# Patient Record
Sex: Female | Born: 1985
Health system: Southern US, Community
[De-identification: ages and names within clinical notes are randomized; demographics above are authoritative.]

## PROBLEM LIST (undated history)

## (undated) DIAGNOSIS — J45909 Unspecified asthma, uncomplicated: Secondary | ICD-10-CM

---

## 2016-08-01 ENCOUNTER — Emergency Department (HOSPITAL_COMMUNITY)
Admission: EM | Admit: 2016-08-01 | Discharge: 2016-08-01 | Disposition: A | Discharge status: Home / Self Care | Payer: Medicare Other | Attending: Emergency Medicine | Admitting: Emergency Medicine

## 2016-08-01 ENCOUNTER — Emergency Department (HOSPITAL_COMMUNITY): Payer: Medicare Other

## 2016-08-01 ENCOUNTER — Encounter (HOSPITAL_COMMUNITY): Payer: Self-pay | Admitting: Emergency Medicine

## 2016-08-01 DIAGNOSIS — R0602 Shortness of breath: Secondary | ICD-10-CM | POA: Diagnosis present

## 2016-08-01 DIAGNOSIS — J9801 Acute bronchospasm: Secondary | ICD-10-CM

## 2016-08-01 DIAGNOSIS — D649 Anemia, unspecified: Secondary | ICD-10-CM | POA: Insufficient documentation

## 2016-08-01 HISTORY — DX: Unspecified asthma, uncomplicated: J45.909

## 2016-08-01 LAB — CBC
HEMATOCRIT: 30.5 % — AB (ref 36.0–46.0)
Hemoglobin: 9.3 g/dL — ABNORMAL LOW (ref 12.0–15.0)
MCH: 21.7 pg — ABNORMAL LOW (ref 26.0–34.0)
MCHC: 30.5 g/dL (ref 30.0–36.0)
MCV: 71.1 fL — AB (ref 78.0–100.0)
Platelets: 215 10*3/uL (ref 150–400)
RBC: 4.29 MIL/uL (ref 3.87–5.11)
RDW: 15.3 % (ref 11.5–15.5)
WBC: 7.1 10*3/uL (ref 4.0–10.5)

## 2016-08-01 LAB — BASIC METABOLIC PANEL
Anion gap: 9 (ref 5–15)
BUN: 8 mg/dL (ref 6–20)
CHLORIDE: 105 mmol/L (ref 101–111)
CO2: 25 mmol/L (ref 22–32)
Calcium: 8.6 mg/dL — ABNORMAL LOW (ref 8.9–10.3)
Creatinine, Ser: 0.78 mg/dL (ref 0.44–1.00)
GFR calc Af Amer: >60 mL/min (ref >60)
GLUCOSE: 135 mg/dL — AB (ref 65–99)
POTASSIUM: 3.1 mmol/L — AB (ref 3.5–5.1)
Sodium: 139 mmol/L (ref 135–145)

## 2016-08-01 LAB — D-DIMER, QUANTITATIVE: D-Dimer, Quant: <0.27 ug/mL-FEU (ref 0.00–0.50)

## 2016-08-01 LAB — I-STAT TROPONIN, ED: Troponin i, poc: 0 ng/mL (ref 0.00–0.08)

## 2016-08-01 MED ORDER — IPRATROPIUM BROMIDE 0.02 % IN SOLN
0.5000 mg | Freq: Once | RESPIRATORY_TRACT | Status: AC
  Administered 2016-08-01: 0.5 mg via RESPIRATORY_TRACT
  Filled 2016-08-01: qty 2.5

## 2016-08-01 MED ORDER — PREDNISONE 20 MG PO TABS: 40.0000 mg | ORAL_TABLET | Freq: Every day | ORAL | 0 refills | Status: DC

## 2016-08-01 MED ORDER — SODIUM CHLORIDE 0.9 % IV BOLUS (SEPSIS)
1000.0000 mL | Freq: Once | INTRAVENOUS | Status: AC
  Administered 2016-08-01: 1000 mL via INTRAVENOUS

## 2016-08-01 MED ORDER — ALBUTEROL SULFATE HFA 108 (90 BASE) MCG/ACT IN AERS
2.0000 | INHALATION_SPRAY | Freq: Once | RESPIRATORY_TRACT | Status: AC
  Administered 2016-08-01: 2 via RESPIRATORY_TRACT
  Filled 2016-08-01: qty 6.7

## 2016-08-01 MED ORDER — MAGNESIUM SULFATE 2 GM/50ML IV SOLN
2.0000 g | Freq: Once | INTRAVENOUS | Status: AC
  Administered 2016-08-01: 2 g via INTRAVENOUS
  Filled 2016-08-01: qty 50

## 2016-08-01 MED ORDER — ALBUTEROL (5 MG/ML) CONTINUOUS INHALATION SOLN
10.0000 mg/h | INHALATION_SOLUTION | Freq: Once | RESPIRATORY_TRACT | Status: AC
  Administered 2016-08-01: 10 mg/h via RESPIRATORY_TRACT
  Filled 2016-08-01: qty 20

## 2016-08-01 MED ORDER — METHYLPREDNISOLONE SODIUM SUCC 125 MG IJ SOLR
125.0000 mg | Freq: Once | INTRAMUSCULAR | Status: AC
  Administered 2016-08-01: 125 mg via INTRAVENOUS
  Filled 2016-08-01: qty 2

## 2016-08-01 NOTE — ED Provider Notes (Signed)
[facility]-EMERGENCY DEPT Provider Note   CSN: [id number] Arrival date & time: 08/01/16  1032     History   Chief Complaint Chief Complaint  Patient presents with  . Chest Pain  . Shortness of Breath    HPI Lori Black is a 30 y.o. female.  HPI Lori Black is a 30 y.o. female with history of asthma, presents to emergency department complaining of shortness of breath. Patient states that she had a viral upper respiratory tract infection 2 weeks ago, states symptoms have been improving up until recently when her cough has gotten worse and now she is wheezing and having shortness of breath. She states she is unable to sleep at night because of shortness of breath. She has been doing at home nebulized treatments which she states has not helped. Reports multiple treatments every day. He does not have a primary care doctor at this time. She has not seen anybody for this. She denies any chest pain. She denies any fever or chills. She denies pregnancy. She states her cough is dry, nonproductive. She denies any pain or swelling in extremities. She denies any recent travel. She did move from [place] a little over a month ago. She reports no other associated symptoms.  Past Medical History:  Diagnosis Date  . Asthma     There are no active problems to display for this patient.   Past Surgical History:  Procedure Laterality Date  . CESAREAN SECTION      OB History    No data available       Home Medications    Prior to Admission medications   Not on File    Family History History reviewed. No pertinent family history.  Social History Social History  Substance Use Topics  . Smoking status: Never Smoker  . Smokeless tobacco: Never Used  . Alcohol use Yes     Comment: occassionally     Allergies   Review of patient's allergies indicates no known allergies.   Review of Systems Review of Systems  Constitutional: Negative for chills and fever.  HENT: Positive for  congestion.   Respiratory: Positive for cough, chest tightness, shortness of breath and wheezing.   Cardiovascular: Negative for chest pain, palpitations and leg swelling.  Gastrointestinal: Negative for abdominal pain, diarrhea, nausea and vomiting.  Genitourinary: Negative for dysuria, flank pain and pelvic pain.  Musculoskeletal: Negative for arthralgias, myalgias, neck pain and neck stiffness.  Skin: Negative for rash.  Neurological: Negative for dizziness, weakness and headaches.  All other systems reviewed and are negative.    Physical Exam Updated Vital Signs BP 152/80   Pulse 113   Temp 97.9 F (36.6 C) (Oral)   Resp 14   LMP 07/16/2016   SpO2 92%   Physical Exam  Constitutional: She appears well-developed and well-nourished. No distress.  HENT:  Head: Normocephalic.  Eyes: Conjunctivae are normal.  Neck: Neck supple.  Cardiovascular: Normal rate, regular rhythm and normal heart sounds.   Pulmonary/Chest: Effort normal. No respiratory distress. She has wheezes. She has no rales.  Abdominal: Soft. Bowel sounds are normal. She exhibits no distension. There is no tenderness. There is no rebound.  Musculoskeletal: She exhibits no edema.  Neurological: She is alert.  Skin: Skin is warm and dry.  Psychiatric: She has a normal mood and affect. Her behavior is normal.  Nursing note and vitals reviewed.    ED Treatments / Results  Labs (all labs ordered are listed, but only abnormal results are displayed)  Labs Reviewed  BASIC METABOLIC PANEL - Abnormal; Notable for the following:       Result Value   Potassium 3.1 (*)    Glucose, Bld 135 (*)    Calcium 8.6 (*)    All other components within normal limits  CBC - Abnormal; Notable for the following:    Hemoglobin 9.3 (*)    HCT 30.5 (*)    MCV 71.1 (*)    MCH 21.7 (*)    All other components within normal limits  D-DIMER, QUANTITATIVE (NOT AT [facility])  Rosezena Sensor, ED    EKG ED ECG REPORT   Date:  08/01/2016  Rate: 102  Rhythm: normal sinus rhythm  QRS Axis: normal  Intervals: normal  ST/T Wave abnormalities: normal  Conduction Disutrbances:none  Narrative Interpretation:   Old EKG Reviewed: none available  I have personally reviewed the EKG tracing and agree with the computerized printout as noted.  Radiology Dg Chest 2 View  Result Date: 08/01/2016 CLINICAL DATA:  30 year old female with history of asthma. Cold for the past 2 weeks with shortness breath today. Initial encounter. EXAM: CHEST  2 VIEW COMPARISON:  None. FINDINGS: No infiltrate, congestive heart failure or pneumothorax. Azygos lobe configuration incidentally noted. Heart size within normal limits. No plain film evidence pulmonary malignancy. Minimal curvature thoracic spine. IMPRESSION: No active cardiopulmonary disease. Electronically Signed   By: Lacy Duverney M.D.   On: 08/01/2016 11:08    Procedures Procedures (including critical care time)  Medications Ordered in ED Medications  albuterol (PROVENTIL,VENTOLIN) solution continuous neb (not administered)  ipratropium (ATROVENT) nebulizer solution 0.5 mg (not administered)  methylPREDNISolone sodium succinate (SOLU-MEDROL) 125 mg/2 mL injection 125 mg (not administered)  magnesium sulfate IVPB 2 g 50 mL (not administered)  sodium chloride 0.9 % bolus 1,000 mL (not administered)     Initial Impression / Assessment and Plan / ED Course  I have reviewed the triage vital signs and the nursing notes.  Pertinent labs & imaging results that were available during my care of the patient were reviewed by me and considered in my medical decision making (see chart for details).  Clinical Course   Patient seen and examined, patient with wheezing, shortness of breath, cough for the last several days. A history of asthma. I was asked by nurse to come and see the patient, patient is hypoxic, 88% on room air, wheezing in all lung fields. She is tachycardic with heart rate  of 120s. Given vital signs findings, will get a d-dimer. Patient is low risk for a PE, with a history of the same. Will start on an hour-long treatment, Solu-Medrol and magnesium ordered. Will monitor  3:22 PM D-dimer negative. Patient received 1 hour-long neb. Oxygen saturation 100% room air right now. Will monitor. Will ambulate on pulse ox  4:34 PM Patient ambulated with no shortness of breath. Pulse ox 100% on room air. Patient states she feels much better. Lungs are clear. Patient is tachycardic here, heart rate and 120s and 130s. Most likely from albuterol. She came in with heart rate of 100. Her d-dimer is negative. She is afebrile. Normal blood pressure. Doubt sepsis. She was to be discharged home. We'll discharge home with an inhaler and prednisone. She is anemic, discussed this with her. She'll need to follow-up closely with her family doctor. Patient voiced understanding.    Final Clinical Impressions(s) / ED Diagnoses   Final diagnoses:  Bronchospasm  Anemia, unspecified type    New Prescriptions New Prescriptions   PREDNISONE (  DELTASONE) 20 MG TABLET    Take 2 tablets (40 mg total) by mouth daily.     Jaynie Crumbleatyana Makylee Sanborn, PA-C 08/01/16 1642    Heide Scaleshristopher J Tegeler, MD 08/01/16 [phone]

## 2016-08-01 NOTE — ED Notes (Signed)
Respiratory at bedside.

## 2016-08-01 NOTE — ED Notes (Signed)
Patients Lori Black

## 2016-08-01 NOTE — ED Triage Notes (Signed)
Pt states she has asthma and has been increasingly SOB. Pt states she cannot sleep. Pt has cough and nasal congestion. Pt also states her throat is sore. Pt states she has had CP with the SOB. Pt able to talk in complete sentences

## 2016-08-01 NOTE — Discharge Instructions (Signed)
Inhaler or nebulized treatment every 4 hours. Take prednisone as prescribed until all gone, next dose tomorrow. Please follow up with her family doctor for recheck and for further workup of your anemia. Return if worsening symptoms.

## 2016-08-13 ENCOUNTER — Emergency Department (HOSPITAL_COMMUNITY)
Admission: EM | Admit: 2016-08-13 | Discharge: 2016-08-13 | Disposition: A | Discharge status: Home / Self Care | Payer: Medicare Other | Attending: Emergency Medicine | Admitting: Emergency Medicine

## 2016-08-13 ENCOUNTER — Emergency Department (HOSPITAL_COMMUNITY): Payer: Medicare Other

## 2016-08-13 ENCOUNTER — Encounter (HOSPITAL_COMMUNITY): Payer: Self-pay | Admitting: *Deleted

## 2016-08-13 DIAGNOSIS — Z7951 Long term (current) use of inhaled steroids: Secondary | ICD-10-CM | POA: Insufficient documentation

## 2016-08-13 DIAGNOSIS — J45901 Unspecified asthma with (acute) exacerbation: Secondary | ICD-10-CM | POA: Diagnosis not present

## 2016-08-13 DIAGNOSIS — R0602 Shortness of breath: Secondary | ICD-10-CM | POA: Diagnosis present

## 2016-08-13 MED ORDER — PREDNISONE 50 MG PO TABS: ORAL_TABLET | ORAL | 0 refills | Status: DC

## 2016-08-13 MED ORDER — METHYLPREDNISOLONE SODIUM SUCC 125 MG IJ SOLR
125.0000 mg | Freq: Once | INTRAMUSCULAR | Status: AC
  Administered 2016-08-13: 125 mg via INTRAVENOUS
  Filled 2016-08-13: qty 2

## 2016-08-13 MED ORDER — BUDESONIDE-FORMOTEROL FUMARATE 80-4.5 MCG/ACT IN AERO: 2.0000 | INHALATION_SPRAY | Freq: Two times a day (BID) | RESPIRATORY_TRACT | 12 refills | Status: DC

## 2016-08-13 MED ORDER — SODIUM CHLORIDE 0.9 % IV BOLUS (SEPSIS)
500.0000 mL | Freq: Once | INTRAVENOUS | Status: AC
  Administered 2016-08-13: 500 mL via INTRAVENOUS

## 2016-08-13 NOTE — Discharge Instructions (Signed)
Chest x-ray showed no pneumonia. Prescription for Symbicort and prednisone. Continue to use your nebulizer treatments at home.

## 2016-08-13 NOTE — ED Provider Notes (Signed)
[facility]-EMERGENCY DEPT Provider Note   CSN: [id number] Arrival date & time: 08/13/16  1029     History   Chief Complaint Chief Complaint  Patient presents with  . Asthma  . Shortness of Breath    HPI Lori Black is a 30 y.o. female.  Level V caveat for urgent need for intervention. Patient has known severe asthma. She takes albuterol at home. She has been unable to refill her Symbicort. Wheezing and coughing started in the middle the night. She has taken several breathing treatments with minimal relief. EMS was called. She has been on steroids in the past.      Past Medical History:  Diagnosis Date  . Asthma     There are no active problems to display for this patient.   Past Surgical History:  Procedure Laterality Date  . CESAREAN SECTION      OB History    No data available       Home Medications    Prior to Admission medications   Medication Sig Start Date End Date Taking? Authorizing Provider  albuterol (PROVENTIL HFA;VENTOLIN HFA) 108 (90 Base) MCG/ACT inhaler Inhale 2 puffs into the lungs every 4 (four) hours as needed for wheezing or shortness of breath.   Yes Historical Provider, MD  albuterol (PROVENTIL) (2.5 MG/3ML) 0.083% nebulizer solution Take 2.5 mg by nebulization every 6 (six) hours as needed for wheezing or shortness of breath.   Yes Historical Provider, MD  budesonide-formoterol (SYMBICORT) 80-4.5 MCG/ACT inhaler Inhale 2 puffs into the lungs 2 (two) times daily. 08/13/16   Donnetta HutchingBrian Kierston Plasencia, MD  predniSONE (DELTASONE) 50 MG tablet 1 tablet daily for 4 days, one half tab daily for 4 days 08/13/16   Donnetta HutchingBrian Khush Pasion, MD    Family History No family history on file.  Social History Social History  Substance Use Topics  . Smoking status: Never Smoker  . Smokeless tobacco: Never Used  . Alcohol use Yes     Comment: occassionally     Allergies   Review of patient's allergies indicates no known allergies.   Review of Systems Review of Systems    Reason unable to perform ROS: urgent need for intervention.     Physical Exam Updated Vital Signs BP 117/73   Pulse 119   Temp 99.1 F (37.3 C) (Oral)   Resp 22   LMP 07/30/2016   SpO2 93%   Physical Exam  Constitutional: She is oriented to person, place, and time. She appears well-developed and well-nourished.  HENT:  Head: Normocephalic and atraumatic.  Eyes: Conjunctivae are normal.  Neck: Neck supple.  Cardiovascular: Normal rate and regular rhythm.   Pulmonary/Chest:  Slight tachypnea; bilateral exp wheezing  Abdominal: Soft. Bowel sounds are normal.  Musculoskeletal: Normal range of motion.  Neurological: She is alert and oriented to person, place, and time.  Skin: Skin is warm and dry.  Psychiatric: She has a normal mood and affect. Her behavior is normal.  Nursing note and vitals reviewed.    ED Treatments / Results  Labs (all labs ordered are listed, but only abnormal results are displayed) Labs Reviewed - No data to display  EKG  EKG Interpretation None       Radiology Dg Chest 2 View  Result Date: 08/13/2016 CLINICAL DATA:  Shortness of breath. Dry cough. Upper chest pain with coughing. EXAM: CHEST  2 VIEW COMPARISON:  08/01/2016. FINDINGS: Normal sized heart. Clear lungs with normal vascularity. Minimal thoracic spine degenerative changes. IMPRESSION: No acute abnormality. Electronically Signed  By: Beckie SaltsSteven  Reid M.D.   On: 08/13/2016 11:39    Procedures Procedures (including critical care time)  Medications Ordered in ED Medications  sodium chloride 0.9 % bolus 500 mL (0 mLs Intravenous Stopped 08/13/16 1307)  methylPREDNISolone sodium succinate (SOLU-MEDROL) 125 mg/2 mL injection 125 mg (125 mg Intravenous Given 08/13/16 1151)     Initial Impression / Assessment and Plan / ED Course  I have reviewed the triage vital signs and the nursing notes.  Pertinent labs & imaging results that were available during my care of the patient were  reviewed by me and considered in my medical decision making (see chart for details).  Clinical Course    Patient feels better after nebulizer treatment and IV steroids. Chest x-ray shows no pneumonia.  Chest x-ray negative. Discharge medication Symbicort and prednisone.  Referral to pulmonologist.  Final Clinical Impressions(s) / ED Diagnoses   Final diagnoses:  Moderate asthma with exacerbation, unspecified whether persistent    New Prescriptions Discharge Medication List as of 08/13/2016  1:45 PM    START taking these medications   Details  budesonide-formoterol (SYMBICORT) 80-4.5 MCG/ACT inhaler Inhale 2 puffs into the lungs 2 (two) times daily., Starting Sat 08/13/2016, Print         Donnetta HutchingBrian Sarabelle Genson, MD 08/13/16 1421

## 2016-08-13 NOTE — ED Notes (Signed)
Family at bedside. 

## 2016-08-13 NOTE — ED Triage Notes (Signed)
Pt BIB EMS, SOB since 0300 has taken a total of 4 or more Albuterol treatments per nebulizer. Upon EMS arrival she was at 87% RA wheezing throughout. ST 12 lead EKG in route. She has not taken Symbicort and prednisone in 1 month which is apart of her daily medication regimen

## 2016-10-09 ENCOUNTER — Emergency Department (HOSPITAL_COMMUNITY): Payer: Medicare Other

## 2016-10-09 ENCOUNTER — Encounter (HOSPITAL_COMMUNITY): Payer: Self-pay | Admitting: Emergency Medicine

## 2016-10-09 ENCOUNTER — Inpatient Hospital Stay (HOSPITAL_COMMUNITY)
Admission: EM | Admit: 2016-10-09 | Discharge: 2016-10-11 | DRG: 202 | Disposition: A | Discharge status: Home / Self Care | Payer: Medicare Other | Attending: Internal Medicine | Admitting: Internal Medicine

## 2016-10-09 DIAGNOSIS — J45901 Unspecified asthma with (acute) exacerbation: Secondary | ICD-10-CM | POA: Diagnosis not present

## 2016-10-09 DIAGNOSIS — J4541 Moderate persistent asthma with (acute) exacerbation: Secondary | ICD-10-CM

## 2016-10-09 DIAGNOSIS — J09X2 Influenza due to identified novel influenza A virus with other respiratory manifestations: Secondary | ICD-10-CM | POA: Diagnosis present

## 2016-10-09 DIAGNOSIS — R0602 Shortness of breath: Secondary | ICD-10-CM | POA: Diagnosis not present

## 2016-10-09 DIAGNOSIS — A419 Sepsis, unspecified organism: Secondary | ICD-10-CM | POA: Diagnosis not present

## 2016-10-09 DIAGNOSIS — J9600 Acute respiratory failure, unspecified whether with hypoxia or hypercapnia: Secondary | ICD-10-CM | POA: Diagnosis not present

## 2016-10-09 DIAGNOSIS — D509 Iron deficiency anemia, unspecified: Secondary | ICD-10-CM

## 2016-10-09 DIAGNOSIS — J4521 Mild intermittent asthma with (acute) exacerbation: Secondary | ICD-10-CM

## 2016-10-09 DIAGNOSIS — N390 Urinary tract infection, site not specified: Secondary | ICD-10-CM

## 2016-10-09 DIAGNOSIS — R000 Tachycardia, unspecified: Secondary | ICD-10-CM | POA: Diagnosis present

## 2016-10-09 DIAGNOSIS — T380X5A Adverse effect of glucocorticoids and synthetic analogues, initial encounter: Secondary | ICD-10-CM | POA: Diagnosis present

## 2016-10-09 DIAGNOSIS — Z79899 Other long term (current) drug therapy: Secondary | ICD-10-CM

## 2016-10-09 DIAGNOSIS — B349 Viral infection, unspecified: Secondary | ICD-10-CM

## 2016-10-09 DIAGNOSIS — Z7951 Long term (current) use of inhaled steroids: Secondary | ICD-10-CM

## 2016-10-09 DIAGNOSIS — N3090 Cystitis, unspecified without hematuria: Secondary | ICD-10-CM | POA: Diagnosis present

## 2016-10-09 DIAGNOSIS — J9601 Acute respiratory failure with hypoxia: Secondary | ICD-10-CM | POA: Diagnosis present

## 2016-10-09 DIAGNOSIS — J101 Influenza due to other identified influenza virus with other respiratory manifestations: Secondary | ICD-10-CM

## 2016-10-09 LAB — RESPIRATORY PANEL BY PCR
Adenovirus: NOT DETECTED
BORDETELLA PERTUSSIS-RVPCR: NOT DETECTED
CORONAVIRUS 229E-RVPPCR: NOT DETECTED
Chlamydophila pneumoniae: NOT DETECTED
Coronavirus HKU1: NOT DETECTED
Coronavirus NL63: NOT DETECTED
Coronavirus OC43: NOT DETECTED
INFLUENZA B-RVPPCR: NOT DETECTED
Influenza A H3: DETECTED — AB
METAPNEUMOVIRUS-RVPPCR: NOT DETECTED
Mycoplasma pneumoniae: NOT DETECTED
PARAINFLUENZA VIRUS 2-RVPPCR: NOT DETECTED
Parainfluenza Virus 1: NOT DETECTED
Parainfluenza Virus 3: NOT DETECTED
Parainfluenza Virus 4: NOT DETECTED
RESPIRATORY SYNCYTIAL VIRUS-RVPPCR: NOT DETECTED
Rhinovirus / Enterovirus: NOT DETECTED

## 2016-10-09 LAB — CBC WITH DIFFERENTIAL/PLATELET
BASOS PCT: 0 %
Basophils Absolute: 0 10*3/uL (ref 0.0–0.1)
EOS ABS: 0 10*3/uL (ref 0.0–0.7)
EOS PCT: 0 %
HCT: 30.7 % — ABNORMAL LOW (ref 36.0–46.0)
Hemoglobin: 9.7 g/dL — ABNORMAL LOW (ref 12.0–15.0)
LYMPHS ABS: 0.7 10*3/uL (ref 0.7–4.0)
Lymphocytes Relative: 8 %
MCH: 21.7 pg — AB (ref 26.0–34.0)
MCHC: 31.6 g/dL (ref 30.0–36.0)
MCV: 68.7 fL — ABNORMAL LOW (ref 78.0–100.0)
MONOS PCT: 6 %
Monocytes Absolute: 0.6 10*3/uL (ref 0.1–1.0)
NEUTROS PCT: 85 %
Neutro Abs: 7.5 10*3/uL (ref 1.7–7.7)
PLATELETS: 162 10*3/uL (ref 150–400)
RBC: 4.47 MIL/uL (ref 3.87–5.11)
RDW: 15.4 % (ref 11.5–15.5)
WBC: 8.8 10*3/uL (ref 4.0–10.5)

## 2016-10-09 LAB — COMPREHENSIVE METABOLIC PANEL
ALK PHOS: 47 U/L (ref 38–126)
ALT: 20 U/L (ref 14–54)
AST: 22 U/L (ref 15–41)
Albumin: 4 g/dL (ref 3.5–5.0)
Anion gap: 12 (ref 5–15)
BUN: 13 mg/dL (ref 6–20)
CALCIUM: 8.3 mg/dL — AB (ref 8.9–10.3)
CO2: 23 mmol/L (ref 22–32)
CREATININE: 0.87 mg/dL (ref 0.44–1.00)
Chloride: 100 mmol/L — ABNORMAL LOW (ref 101–111)
Glucose, Bld: 153 mg/dL — ABNORMAL HIGH (ref 65–99)
Potassium: 3.2 mmol/L — ABNORMAL LOW (ref 3.5–5.1)
Sodium: 135 mmol/L (ref 135–145)
Total Bilirubin: 1 mg/dL (ref 0.3–1.2)
Total Protein: 7 g/dL (ref 6.5–8.1)

## 2016-10-09 LAB — URINALYSIS, ROUTINE W REFLEX MICROSCOPIC
Bilirubin Urine: NEGATIVE
GLUCOSE, UA: NEGATIVE mg/dL
Ketones, ur: 5 mg/dL — AB
NITRITE: NEGATIVE
PH: 5 (ref 5.0–8.0)
Protein, ur: NEGATIVE mg/dL
RBC / HPF: NONE SEEN RBC/hpf (ref 0–5)
SPECIFIC GRAVITY, URINE: 1.015 (ref 1.005–1.030)

## 2016-10-09 LAB — I-STAT CG4 LACTIC ACID, ED
Lactic Acid, Venous: 2.39 mmol/L (ref 0.5–1.9)
Lactic Acid, Venous: 1.43 mmol/L (ref 0.5–1.9)

## 2016-10-09 MED ORDER — DEXTROSE 5 % IV SOLN
500.0000 mg | INTRAVENOUS | Status: DC
  Filled 2016-10-09: qty 500

## 2016-10-09 MED ORDER — CEFTRIAXONE SODIUM 1 G IJ SOLR
1.0000 g | INTRAMUSCULAR | Status: AC
  Administered 2016-10-09: 1 g via INTRAVENOUS
  Filled 2016-10-09: qty 10

## 2016-10-09 MED ORDER — DEXTROSE 5 % IV SOLN: 500.0000 mg | INTRAVENOUS | Status: DC

## 2016-10-09 MED ORDER — SODIUM CHLORIDE 0.9 % IV SOLN
INTRAVENOUS | Status: DC
  Administered 2016-10-09 – 2016-10-11 (×3): via INTRAVENOUS

## 2016-10-09 MED ORDER — ONDANSETRON HCL 4 MG PO TABS
4.0000 mg | ORAL_TABLET | Freq: Four times a day (QID) | ORAL | Status: DC | PRN
  Administered 2016-10-10 – 2016-10-11 (×2): 4 mg via ORAL
  Filled 2016-10-09 (×2): qty 1

## 2016-10-09 MED ORDER — DEXTROSE 5 % IV SOLN
500.0000 mg | Freq: Once | INTRAVENOUS | Status: AC
  Administered 2016-10-09: 500 mg via INTRAVENOUS
  Filled 2016-10-09: qty 500

## 2016-10-09 MED ORDER — SODIUM CHLORIDE 0.9 % IV SOLN
30.0000 meq | Freq: Once | INTRAVENOUS | Status: DC
  Administered 2016-10-09: 30 meq via INTRAVENOUS

## 2016-10-09 MED ORDER — OSELTAMIVIR PHOSPHATE 75 MG PO CAPS
75.0000 mg | ORAL_CAPSULE | Freq: Two times a day (BID) | ORAL | Status: DC
  Administered 2016-10-09 – 2016-10-11 (×4): 75 mg via ORAL
  Filled 2016-10-09 (×4): qty 1

## 2016-10-09 MED ORDER — ONDANSETRON HCL 4 MG/2ML IJ SOLN: 4.0000 mg | Freq: Four times a day (QID) | INTRAMUSCULAR | Status: DC | PRN

## 2016-10-09 MED ORDER — ACETAMINOPHEN 325 MG PO TABS: 650.0000 mg | ORAL_TABLET | Freq: Four times a day (QID) | ORAL | Status: DC | PRN

## 2016-10-09 MED ORDER — METHYLPREDNISOLONE SODIUM SUCC 125 MG IJ SOLR
125.0000 mg | Freq: Once | INTRAMUSCULAR | Status: AC
  Administered 2016-10-09: 125 mg via INTRAVENOUS
  Filled 2016-10-09: qty 2

## 2016-10-09 MED ORDER — SODIUM CHLORIDE 0.9 % IV SOLN
1000.0000 mL | INTRAVENOUS | Status: DC
  Administered 2016-10-09 (×2): 1000 mL via INTRAVENOUS

## 2016-10-09 MED ORDER — ACETAMINOPHEN 325 MG PO TABS
650.0000 mg | ORAL_TABLET | Freq: Once | ORAL | Status: AC
  Administered 2016-10-09: 650 mg via ORAL
  Filled 2016-10-09: qty 2

## 2016-10-09 MED ORDER — METHYLPREDNISOLONE SODIUM SUCC 125 MG IJ SOLR
125.0000 mg | Freq: Two times a day (BID) | INTRAMUSCULAR | Status: DC
  Administered 2016-10-10: 125 mg via INTRAVENOUS
  Filled 2016-10-09: qty 2

## 2016-10-09 MED ORDER — ACETAMINOPHEN 650 MG RE SUPP: 650.0000 mg | Freq: Four times a day (QID) | RECTAL | Status: DC | PRN

## 2016-10-09 MED ORDER — POTASSIUM CHLORIDE 10 MEQ/100ML IV SOLN
10.0000 meq | INTRAVENOUS | Status: AC
  Administered 2016-10-09 (×2): 10 meq via INTRAVENOUS
  Filled 2016-10-09 (×3): qty 100

## 2016-10-09 MED ORDER — ALBUTEROL (5 MG/ML) CONTINUOUS INHALATION SOLN
10.0000 mg/h | INHALATION_SOLUTION | Freq: Once | RESPIRATORY_TRACT | Status: AC
  Administered 2016-10-09: 10 mg/h via RESPIRATORY_TRACT
  Filled 2016-10-09: qty 20

## 2016-10-09 MED ORDER — IPRATROPIUM-ALBUTEROL 0.5-2.5 (3) MG/3ML IN SOLN: 3.0000 mL | RESPIRATORY_TRACT | Status: DC | PRN

## 2016-10-09 MED ORDER — PNEUMOCOCCAL VAC POLYVALENT 25 MCG/0.5ML IJ INJ
0.5000 mL | INJECTION | INTRAMUSCULAR | Status: DC
  Filled 2016-10-09: qty 0.5

## 2016-10-09 MED ORDER — LEVALBUTEROL HCL 0.63 MG/3ML IN NEBU
0.6300 mg | INHALATION_SOLUTION | RESPIRATORY_TRACT | Status: DC | PRN
  Administered 2016-10-09 – 2016-10-11 (×4): 0.63 mg via RESPIRATORY_TRACT
  Filled 2016-10-09 (×5): qty 3

## 2016-10-09 MED ORDER — LEVALBUTEROL HCL 0.63 MG/3ML IN NEBU: 0.6300 mg | INHALATION_SOLUTION | RESPIRATORY_TRACT | Status: DC

## 2016-10-09 MED ORDER — SODIUM CHLORIDE 0.9 % IV BOLUS (SEPSIS)
1000.0000 mL | Freq: Once | INTRAVENOUS | Status: AC
  Administered 2016-10-09: 1000 mL via INTRAVENOUS

## 2016-10-09 MED ORDER — DEXTROSE 5 % IV SOLN: 1.0000 g | INTRAVENOUS | Status: DC

## 2016-10-09 MED ORDER — DEXTROSE 5 % IV SOLN
1.0000 g | INTRAVENOUS | Status: DC
  Filled 2016-10-09: qty 10

## 2016-10-09 MED ORDER — HEPARIN SODIUM (PORCINE) 5000 UNIT/ML IJ SOLN
5000.0000 [IU] | Freq: Three times a day (TID) | INTRAMUSCULAR | Status: DC
  Administered 2016-10-09: 5000 [IU] via SUBCUTANEOUS
  Filled 2016-10-09 (×2): qty 1

## 2016-10-09 MED ORDER — POTASSIUM CHLORIDE CRYS ER 20 MEQ PO TBCR
40.0000 meq | EXTENDED_RELEASE_TABLET | Freq: Once | ORAL | Status: AC
  Administered 2016-10-09: 40 meq via ORAL
  Filled 2016-10-09: qty 2

## 2016-10-09 MED ORDER — DOXYCYCLINE HYCLATE 100 MG IV SOLR: 100.0000 mg | Freq: Two times a day (BID) | INTRAVENOUS | Status: DC

## 2016-10-09 MED ORDER — IPRATROPIUM-ALBUTEROL 0.5-2.5 (3) MG/3ML IN SOLN: 3.0000 mL | RESPIRATORY_TRACT | Status: DC

## 2016-10-09 MED ORDER — INFLUENZA VAC SPLIT QUAD 0.5 ML IM SUSY
0.5000 mL | PREFILLED_SYRINGE | INTRAMUSCULAR | Status: DC
  Filled 2016-10-09: qty 0.5

## 2016-10-09 MED ORDER — LEVALBUTEROL HCL 0.63 MG/3ML IN NEBU: 0.6300 mg | INHALATION_SOLUTION | Freq: Four times a day (QID) | RESPIRATORY_TRACT | Status: DC

## 2016-10-09 MED ORDER — DEXTROSE 5 % IV SOLN: 1.0000 g | Freq: Once | INTRAVENOUS | Status: DC

## 2016-10-09 NOTE — ED Notes (Signed)
One unsuccessful attempt at iv.

## 2016-10-09 NOTE — Progress Notes (Signed)
Pharmacy Antibiotic Note  Lori MolaDevin Black is a 30 y.o. female admitted on 10/09/2016 with CAP, Code sepsis.  Pharmacy has been consulted for Ceftriaxone and Azithromycin dosing.   Lactic acid 2.39 Blood and urine cxt pending.   Plan:  Ceftriaxone 1g IV q24h  Azithromycin 500mg  IV q24h  Dosage remains stable and need for further dosage adjustment appears unlikely at present.  Pharmacy will sign off at this time.  Please reconsult if a change in clinical status warrants re-evaluation of dosage.   Weight: 145 lb (65.8 kg)  Temp (24hrs), Avg:102.8 F (39.3 C), Min:102.8 F (39.3 C), Max:102.8 F (39.3 C)   Recent Labs Lab 10/09/16 1058 10/09/16 1108  CREATININE 0.87  --   LATICACIDVEN  --  2.39*    CrCl cannot be calculated (Unknown ideal weight.).    No Known Allergies  Thank you for allowing pharmacy to be a part of this patient's care.  Lynann Beaverhristine Mariel Gaudin PharmD, BCPS Pager [phone] 10/09/2016 11:49 AM

## 2016-10-09 NOTE — ED Notes (Signed)
Pt made aware of need for urine specimen. Pt can't provide one at this time. IV fluids may assist after infusing.

## 2016-10-09 NOTE — ED Provider Notes (Signed)
[facility]-EMERGENCY DEPT Provider Note   CSN: [id number] Arrival date & time: 10/09/16  [id number]     History   Chief Complaint Chief Complaint  Patient presents with  . Shortness of Breath    HPI Lori Black is a 30 y.o. female.  HPI   30 year old female with history of asthma, presenting for evaluation of cold symptoms. Patient states she has been having cold symptoms for the past 2 weeks. She was sick when she was exposed to kids with radial sneezing coughing. The symptoms subsequently improved after she received prednisone however her symptoms returns since last night. She reported having congestion, fever, chills, body aches, nonproductive cough, wheezing, shortness of breath, nausea vomiting diarrhea. Denies severe headache, neck stiffness, hemoptysis, abdominal pain, dysuria, or rash. She has been using her nebulizer machine, as well as rescue inhaler every 2 hours. She uses a breathing treatment this morning but it did not help. Therefore patient brought here for further care. Patient did receive 10 mg of albuterol and 0.5 mg of Atrovent prior to arrival.  Past Medical History:  Diagnosis Date  . Asthma     There are no active problems to display for this patient.   Past Surgical History:  Procedure Laterality Date  . CESAREAN SECTION      OB History    No data available       Home Medications    Prior to Admission medications   Medication Sig Start Date End Date Taking? Authorizing Provider  albuterol (PROVENTIL HFA;VENTOLIN HFA) 108 (90 Base) MCG/ACT inhaler Inhale 2 puffs into the lungs every 4 (four) hours as needed for wheezing or shortness of breath.    Historical Provider, MD  albuterol (PROVENTIL) (2.5 MG/3ML) 0.083% nebulizer solution Take 2.5 mg by nebulization every 6 (six) hours as needed for wheezing or shortness of breath.    Historical Provider, MD  budesonide-formoterol (SYMBICORT) 80-4.5 MCG/ACT inhaler Inhale 2 puffs into the lungs 2 (two) times daily.  08/13/16   Donnetta HutchingBrian Cook, MD  predniSONE (DELTASONE) 50 MG tablet 1 tablet daily for 4 days, one half tab daily for 4 days 08/13/16   Donnetta HutchingBrian Cook, MD    Family History No family history on file.  Social History Social History  Substance Use Topics  . Smoking status: Never Smoker  . Smokeless tobacco: Never Used  . Alcohol use Yes     Comment: occassionally     Allergies   Patient has no known allergies.   Review of Systems Review of Systems  All other systems reviewed and are negative.    Physical Exam Updated Vital Signs SpO2 100%   Physical Exam  Constitutional: She appears well-developed and well-nourished. No distress.  HENT:  Head: Atraumatic.  Right Ear: External ear normal.  Left Ear: External ear normal.  Nose: Nose normal.  Mouth/Throat: Oropharynx is clear and moist.  Eyes: Conjunctivae are normal.  Neck: Normal range of motion. Neck supple.  Cardiovascular: Intact distal pulses.   Tachycardia without murmurs rubs gallops  Pulmonary/Chest:  Decreased breath sounds without overt wheezes, rales, rhonchi  Abdominal: Soft. Bowel sounds are normal. She exhibits no distension. There is no tenderness.  Musculoskeletal: She exhibits no edema.  Neurological: She is alert.  Skin: No rash noted.  Psychiatric: She has a normal mood and affect.  Nursing note and vitals reviewed.    ED Treatments / Results  Labs (all labs ordered are listed, but only abnormal results are displayed) Labs Reviewed  COMPREHENSIVE METABOLIC PANEL - Abnormal;  Notable for the following:       Result Value   Potassium 3.2 (*)    Chloride 100 (*)    Glucose, Bld 153 (*)    Calcium 8.3 (*)    All other components within normal limits  CBC WITH DIFFERENTIAL/PLATELET - Abnormal; Notable for the following:    Hemoglobin 9.7 (*)    HCT 30.7 (*)    MCV 68.7 (*)    MCH 21.7 (*)    All other components within normal limits  URINALYSIS, ROUTINE W REFLEX MICROSCOPIC - Abnormal; Notable  for the following:    APPearance CLOUDY (*)    Hgb urine dipstick SMALL (*)    Ketones, ur 5 (*)    Leukocytes, UA LARGE (*)    Bacteria, UA RARE (*)    Squamous Epithelial / LPF 6-30 (*)    All other components within normal limits  I-STAT CG4 LACTIC ACID, ED - Abnormal; Notable for the following:    Lactic Acid, Venous 2.39 (*)    All other components within normal limits  CULTURE, BLOOD (ROUTINE X 2)  CULTURE, BLOOD (ROUTINE X 2)  URINE CULTURE  RESPIRATORY PANEL BY PCR  I-STAT CG4 LACTIC ACID, ED  I-STAT CG4 LACTIC ACID, ED    EKG  EKG Interpretation  Date/Time:  Sunday October 09 2016 10:05:40 EST Ventricular Rate:  150 PR Interval:    QRS Duration: 92 QT Interval:  285 QTC Calculation: 451 R Axis:   -99 Text Interpretation:  Sinus tachycardia Atrial premature complex Inferior infarct, old Probable anterior infarct, age indeterminate No significant change since last tracing Confirmed by ALLEN  MD, ANTHONY ([id number]) on 10/09/2016 2:31:59 PM     ED ECG REPORT   Date: 10/09/2016  Rate: 150  Rhythm: sinus tachycardia  QRS Axis: normal  Intervals: normal  ST/T Wave abnormalities: nonspecific ST/T changes  Conduction Disutrbances:none  Narrative Interpretation:   Old EKG Reviewed: changes noted  I have personally reviewed the EKG tracing and agree with the computerized printout as noted.   Radiology Dg Chest 2 View  Result Date: 10/09/2016 CLINICAL DATA:  Cough, congestion, shortness of breath, body aches and fever 2 days. EXAM: CHEST  2 VIEW COMPARISON:  08/05/2016 FINDINGS: Lungs are clear. Cardiomediastinal silhouette, bones and soft tissues are normal. IMPRESSION: No active cardiopulmonary disease. Electronically Signed   By: Elberta Fortisaniel  Boyle M.D.   On: 10/09/2016 11:30    Procedures Procedures (including critical care time)  Medications Ordered in ED Medications  0.9 %  sodium chloride infusion (1,000 mLs Intravenous New Bag/Given 10/09/16 1330)    cefTRIAXone (ROCEPHIN) 1 g in dextrose 5 % 50 mL IVPB (not administered)  azithromycin (ZITHROMAX) 500 mg in dextrose 5 % 250 mL IVPB (not administered)  acetaminophen (TYLENOL) tablet 650 mg (650 mg Oral Given 10/09/16 1130)  sodium chloride 0.9 % bolus 1,000 mL (0 mLs Intravenous Stopped 10/09/16 1307)  azithromycin (ZITHROMAX) 500 mg in dextrose 5 % 250 mL IVPB (0 mg Intravenous Stopped 10/09/16 1315)  cefTRIAXone (ROCEPHIN) 1 g in dextrose 5 % 50 mL IVPB (0 g Intravenous Stopped 10/09/16 1210)  potassium chloride SA (K-DUR,KLOR-CON) CR tablet 40 mEq (40 mEq Oral Given 10/09/16 1330)     Initial Impression / Assessment and Plan / ED Course  I have reviewed the triage vital signs and the nursing notes.  Pertinent labs & imaging results that were available during my care of the patient were reviewed by me and considered in my medical decision making (  see chart for details).  Clinical Course     BP 103/64 (BP Location: Left Arm)   Pulse (!) 122   Temp 100.8 F (38.2 C) (Oral)   Resp 18   Wt 65.8 kg   SpO2 93%    Final Clinical Impressions(s) / ED Diagnoses   Final diagnoses:  Moderate persistent asthma with exacerbation  Viral illness    New Prescriptions New Prescriptions   No medications on file   10:51 AM Patient here with cold symptoms. She is febrile with a temperature of 102.8. She is tachycardia with a heart rate in the 150s. She appears dehydrated. She has had nausea vomiting diarrhea. Suspect viral etiology however given her history, chest x-ray obtained to rule out pneumonia. Will give IV fluid, may consider sepsis protocol if her lactic acid or white count are elevated.  11:22 AM Elevated lactic acid of 2.39.  However pt is not hypotensive.  Therefore, will provide IVF but do not think pt has to be fluid resuscitate at 66ml/kg.  abx started for CAP.    3:13 PM Although pt report feeling a bit better, on reexamination her chest is still tight and wheezing.   She is still tachycardic.  Plan to have pt admitted for asthma exacerbation 2/2 viral etiology.    Appreciate consultation from [facility] Dr. Alvester Morin who agrees to see pt in the ER and will admit for further care.     Fayrene Helper, PA-C 10/09/16 1518    Lorre Nick, MD 10/16/16 [phone]

## 2016-10-09 NOTE — H&P (Addendum)
History and Physical    Lori Black RUE:[id number] DOB: 04/19/1986 DOA: 10/09/2016  Referring MD/NP/PA: Laveda Normanran PA  PCP: No PCP Per Patient  Outpatient Specialists: none   Patient coming from: Home   Chief Complaint: acute resp failure, sepsis, asthma exacerbation, flu like sxs, UTI    HPI: Lori Black is a 30 y.o. female with medical history significant of asthma presenting w/ acute resp failure, sepsis, asthma exacerbation, flu like sxs, UTI . Pt states that she had a mild URI approx 2 weeks ago that she improved with any significant issues. Pt reports that he children have recently coming w/ febrile viral illness over past week. Had progressive high grade temps, generalized malaise, chills, cough since last night. Baseline asthma. No flu shot this year. On symbicort chronically. Reports worsening in resp sxs over the past 12-24 hours. Sxs minimally improved w/ home albuterol use.  + nausea, vomiting. 1-2 episodes of watery stools/diarrhea.   ED Course: Presented to ER T max 102.6, HR 120s-150s- though downtrending. SBPs 90s-110s. Satting in mid 90s on RA. WBC 8.8. Lactate 2.4-->1.3. CXR negative for infiltrate. Placed on empiric CAP coverage w/ Rocephin and azithromycin. Given 1 CAT and IV solumedrol. UA indicative of infection.   Review of Systems: As per HPI otherwise 10 point review of systems negative.   Past Medical History:  Diagnosis Date  . Asthma     Past Surgical History:  Procedure Laterality Date  . CESAREAN SECTION       reports that she has never smoked. She has never used smokeless tobacco. She reports that she drinks alcohol. She reports that she does not use drugs.  No Known Allergies  No family history on file.  Prior to Admission medications   Medication Sig Start Date End Date Taking? Authorizing Provider  albuterol (PROVENTIL HFA;VENTOLIN HFA) 108 (90 Base) MCG/ACT inhaler Inhale 2 puffs into the lungs every 4 (four) hours as needed for wheezing or shortness  of breath.   Yes Historical Provider, MD  albuterol (PROVENTIL) (2.5 MG/3ML) 0.083% nebulizer solution Take 2.5 mg by nebulization every 6 (six) hours as needed for wheezing or shortness of breath.   Yes Historical Provider, MD  budesonide-formoterol (SYMBICORT) 80-4.5 MCG/ACT inhaler Inhale 2 puffs into the lungs 2 (two) times daily. 08/13/16  Yes Donnetta HutchingBrian Cook, MD  Chlorpheniramine Maleate (ALLERGY RELIEF PO) Take 1 tablet by mouth daily. OTC store brand allergy relief pill, pt is unable to verify exact name   Yes Historical Provider, MD  predniSONE (DELTASONE) 50 MG tablet 1 tablet daily for 4 days, one half tab daily for 4 days Patient not taking: Reported on 10/09/2016 08/13/16   Donnetta HutchingBrian Cook, MD    Physical Exam: Vitals:   10/09/16 1440 10/09/16 1500 10/09/16 1509 10/09/16 1519  BP:  116/79 116/79   Pulse: (!) 142 (!) 124 120   Resp:  16 (!) 31   Temp:   99.9 F (37.7 C)   TempSrc:   Oral   SpO2: 99% 94% 97% 98%  Weight:          Constitutional: NAD, calm, comfortable Vitals:   10/09/16 1440 10/09/16 1500 10/09/16 1509 10/09/16 1519  BP:  116/79 116/79   Pulse: (!) 142 (!) 124 120   Resp:  16 (!) 31   Temp:   99.9 F (37.7 C)   TempSrc:   Oral   SpO2: 99% 94% 97% 98%  Weight:       Eyes: PERRL, lids and conjunctivae normal ENMT: Mucous  membranes are moist. Posterior pharynx clear of any exudate or lesions.Normal dentition.  Neck: normal, supple, no masses, no thyromegaly Respiratory:+ mild wheezing diffusely.  no crackles. Normal respiratory effort. No accessory muscle use.  Cardiovascular: Regular rate and rhythm, no murmurs / rubs / gallops. No extremity edema. 2+ pedal pulses. No carotid bruits.  Abdomen: no tenderness, no masses palpated. No hepatosplenomegaly. Bowel sounds positive.  Musculoskeletal: no clubbing / cyanosis. No joint deformity upper and lower extremities. Good ROM, no contractures. Normal muscle tone.  Skin: no rashes, lesions, ulcers. No  induration Neurologic: CN 2-12 grossly intact. Sensation intact, DTR normal. Strength 5/5 in all 4.  Psychiatric: Normal judgment and insight. Alert and oriented x 3. Normal mood.   Labs on Admission: I have personally reviewed following labs and imaging studies  CBC:  Recent Labs Lab 10/09/16 1058  WBC 8.8  NEUTROABS 7.5  HGB 9.7*  HCT 30.7*  MCV 68.7*  PLT 162   Basic Metabolic Panel:  Recent Labs Lab 10/09/16 1058  NA 135  K 3.2*  CL 100*  CO2 23  GLUCOSE 153*  BUN 13  CREATININE 0.87  CALCIUM 8.3*   GFR: CrCl cannot be calculated (Unknown ideal weight.). Liver Function Tests:  Recent Labs Lab 10/09/16 1058  AST 22  ALT 20  ALKPHOS 47  BILITOT 1.0  PROT 7.0  ALBUMIN 4.0  Urine analysis:    Component Value Date/Time   COLORURINE YELLOW 10/09/2016 1047   APPEARANCEUR CLOUDY (A) 10/09/2016 1047   LABSPEC 1.015 10/09/2016 1047   PHURINE 5.0 10/09/2016 1047   GLUCOSEU NEGATIVE 10/09/2016 1047   HGBUR SMALL (A) 10/09/2016 1047   BILIRUBINUR NEGATIVE 10/09/2016 1047   KETONESUR 5 (A) 10/09/2016 1047   PROTEINUR NEGATIVE 10/09/2016 1047   NITRITE NEGATIVE 10/09/2016 1047   LEUKOCYTESUR LARGE (A) 10/09/2016 1047   Sepsis Labs:Radiological Exams on Admission: Dg Chest 2 View  Result Date: 10/09/2016 CLINICAL DATA:  Cough, congestion, shortness of breath, body aches and fever 2 days. EXAM: CHEST  2 VIEW COMPARISON:  08/05/2016 FINDINGS: Lungs are clear. Cardiomediastinal silhouette, bones and soft tissues are normal. IMPRESSION: No active cardiopulmonary disease. Electronically Signed   By: Elberta Fortisaniel  Boyle M.D.   On: 10/09/2016 11:30    EKG: Independently reviewed: sinus tachycardia   Assessment/Plan Active Problems:   Acute respiratory failure (HCC)   1-Sepsis -meets SIRS criteria based on T, HR.  -noted elevated lactate on admission though rapidly improved w/ IVF hydration (2.4-->1.3) -No definitive resp bacterial source- CXR WNL -UA somewhat  concerning for infection  -IV rocephin and azithro for CAP/bronchitis and UTI coverage -Hemodynamics rapidly improving w/ treatment and IVF hydration  -trend lactate  -? Diarrhea -1-2 episodes of loose stools- likely viral  -stool culture x1(c diff pcr not recommended per order set)-abd non tender-defer anaerobic cvg for now, though low threshold to cover -follow closely   2-Acute resp failure  -no hypoxia on presentation  -suspect viral bronchitis vs. Influenza vs atypical infection in setting of baseline asthma  -resp virus panel  -Rocephin and Azithromycin  -tamiflu x 5 days given onset of sxs w/in 24 hours, no recent flu shot and resp comorbidities.  -IV solumedrol  -duonebs  -supplemental O2 prn   3-?UTI  -UA indicative of infection  -will culture  -IV rocephin in the interim     DVT prophylaxis: sub q heparin  Code Status: Full Code   Disposition Plan: pending further evaluation  Admission status: Obs    Doree AlbeeNEWTON, Scotland Korver MD  [facility] Pager [phone]  If 7PM-7AM, please contact night-coverage [email or web address] Password [facility]  10/09/2016, 3:49 PM

## 2016-10-09 NOTE — ED Notes (Signed)
Patient has been notified of pending urine sample. Patient will call out when ready.

## 2016-10-09 NOTE — Progress Notes (Signed)
Pt HR into 150s in setting of duoneb use. Suspect beta agonist/anticholinergic assd tachycardia  Will transition to xopenex Small NS bolus x 1 Tele monitor given HR  Readdressed treatment plan at bedside   ?Diarrhea addressed- loose stool x1 in setting of excessive upper resp drainage. Will monitor for now. Stool cx x1 in setting of sepsis on presentation.

## 2016-10-09 NOTE — ED Notes (Signed)
Spoke with admitting MD.  Advised of HR and VS.  MD to request tele bed.

## 2016-10-09 NOTE — ED Triage Notes (Signed)
Per EMS, pt is from home with complaints of SOB. Before EMS arrived pt administered neb tx at home with no relief. Pt reports being sick last week with sinusitis and has a hx of asthma. EMS reports that pt had bilateral wheezing on arrival. EMS administered 10 mg of albuterol and 0.5 mg of atrovent.

## 2016-10-09 NOTE — Progress Notes (Signed)
RN called RT about patient being short of breath. RT asked the RN to place patient on 2 liter nasal canula. Patient is tolerating well.

## 2016-10-10 DIAGNOSIS — J9600 Acute respiratory failure, unspecified whether with hypoxia or hypercapnia: Secondary | ICD-10-CM

## 2016-10-10 DIAGNOSIS — J101 Influenza due to other identified influenza virus with other respiratory manifestations: Secondary | ICD-10-CM

## 2016-10-10 DIAGNOSIS — A09 Infectious gastroenteritis and colitis, unspecified: Secondary | ICD-10-CM

## 2016-10-10 DIAGNOSIS — J45901 Unspecified asthma with (acute) exacerbation: Secondary | ICD-10-CM | POA: Diagnosis present

## 2016-10-10 DIAGNOSIS — J09X2 Influenza due to identified novel influenza A virus with other respiratory manifestations: Secondary | ICD-10-CM | POA: Diagnosis present

## 2016-10-10 DIAGNOSIS — J111 Influenza due to unidentified influenza virus with other respiratory manifestations: Secondary | ICD-10-CM | POA: Diagnosis not present

## 2016-10-10 DIAGNOSIS — Z7951 Long term (current) use of inhaled steroids: Secondary | ICD-10-CM | POA: Diagnosis not present

## 2016-10-10 DIAGNOSIS — Z79899 Other long term (current) drug therapy: Secondary | ICD-10-CM | POA: Diagnosis not present

## 2016-10-10 DIAGNOSIS — R0602 Shortness of breath: Secondary | ICD-10-CM | POA: Diagnosis present

## 2016-10-10 DIAGNOSIS — R000 Tachycardia, unspecified: Secondary | ICD-10-CM | POA: Diagnosis present

## 2016-10-10 DIAGNOSIS — T380X5A Adverse effect of glucocorticoids and synthetic analogues, initial encounter: Secondary | ICD-10-CM | POA: Diagnosis present

## 2016-10-10 DIAGNOSIS — J9601 Acute respiratory failure with hypoxia: Secondary | ICD-10-CM | POA: Diagnosis present

## 2016-10-10 DIAGNOSIS — J4541 Moderate persistent asthma with (acute) exacerbation: Secondary | ICD-10-CM | POA: Diagnosis not present

## 2016-10-10 DIAGNOSIS — D509 Iron deficiency anemia, unspecified: Secondary | ICD-10-CM

## 2016-10-10 DIAGNOSIS — N3090 Cystitis, unspecified without hematuria: Secondary | ICD-10-CM | POA: Diagnosis present

## 2016-10-10 LAB — COMPREHENSIVE METABOLIC PANEL
ALBUMIN: 3.5 g/dL (ref 3.5–5.0)
ALK PHOS: 39 U/L (ref 38–126)
ALT: 18 U/L (ref 14–54)
ANION GAP: 8 (ref 5–15)
AST: 17 U/L (ref 15–41)
BILIRUBIN TOTAL: 0.5 mg/dL (ref 0.3–1.2)
BUN: 11 mg/dL (ref 6–20)
CALCIUM: 8 mg/dL — AB (ref 8.9–10.3)
CO2: 23 mmol/L (ref 22–32)
Chloride: 108 mmol/L (ref 101–111)
Creatinine, Ser: 0.59 mg/dL (ref 0.44–1.00)
GFR calc Af Amer: >60 mL/min (ref >60)
GLUCOSE: 129 mg/dL — AB (ref 65–99)
POTASSIUM: 3.9 mmol/L (ref 3.5–5.1)
Sodium: 139 mmol/L (ref 135–145)
TOTAL PROTEIN: 6.4 g/dL — AB (ref 6.5–8.1)

## 2016-10-10 LAB — CBC WITH DIFFERENTIAL/PLATELET
BASOS ABS: 0 10*3/uL (ref 0.0–0.1)
Basophils Relative: 0 %
EOS ABS: 0 10*3/uL (ref 0.0–0.7)
Eosinophils Relative: 0 %
HCT: 27.7 % — ABNORMAL LOW (ref 36.0–46.0)
HEMOGLOBIN: 8.7 g/dL — AB (ref 12.0–15.0)
LYMPHS ABS: 0.4 10*3/uL — AB (ref 0.7–4.0)
LYMPHS PCT: 4 %
MCH: 21.5 pg — ABNORMAL LOW (ref 26.0–34.0)
MCHC: 31.4 g/dL (ref 30.0–36.0)
MCV: 68.4 fL — ABNORMAL LOW (ref 78.0–100.0)
Monocytes Absolute: 0.4 10*3/uL (ref 0.1–1.0)
Monocytes Relative: 4 %
NEUTROS ABS: 8.6 10*3/uL — AB (ref 1.7–7.7)
Neutrophils Relative %: 92 %
Platelets: 176 10*3/uL (ref 150–400)
RBC: 4.05 MIL/uL (ref 3.87–5.11)
RDW: 16.1 % — AB (ref 11.5–15.5)
WBC: 9.4 10*3/uL (ref 4.0–10.5)

## 2016-10-10 LAB — URINE CULTURE

## 2016-10-10 MED ORDER — METHYLPREDNISOLONE SODIUM SUCC 125 MG IJ SOLR
60.0000 mg | Freq: Two times a day (BID) | INTRAMUSCULAR | Status: DC
  Administered 2016-10-10 – 2016-10-11 (×2): 60 mg via INTRAVENOUS
  Filled 2016-10-10 (×2): qty 2

## 2016-10-10 MED ORDER — CEFTRIAXONE SODIUM 1 G IJ SOLR: 1.0000 g | INTRAMUSCULAR | Status: DC

## 2016-10-10 MED ORDER — ENOXAPARIN SODIUM 40 MG/0.4ML ~~LOC~~ SOLN
40.0000 mg | SUBCUTANEOUS | Status: DC
  Administered 2016-10-10: 40 mg via SUBCUTANEOUS
  Filled 2016-10-10: qty 0.4

## 2016-10-10 MED ORDER — LEVALBUTEROL HCL 0.63 MG/3ML IN NEBU
0.6300 mg | INHALATION_SOLUTION | RESPIRATORY_TRACT | Status: DC
  Administered 2016-10-10 (×4): 0.63 mg via RESPIRATORY_TRACT
  Filled 2016-10-10 (×3): qty 3

## 2016-10-10 MED ORDER — LEVALBUTEROL HCL 0.63 MG/3ML IN NEBU
0.6300 mg | INHALATION_SOLUTION | Freq: Four times a day (QID) | RESPIRATORY_TRACT | Status: DC
  Administered 2016-10-11 (×2): 0.63 mg via RESPIRATORY_TRACT
  Filled 2016-10-10 (×2): qty 3

## 2016-10-10 MED ORDER — DEXTROSE 5 % IV SOLN
1.0000 g | INTRAVENOUS | Status: DC
  Administered 2016-10-10 – 2016-10-11 (×2): 1 g via INTRAVENOUS
  Filled 2016-10-10 (×2): qty 10

## 2016-10-10 NOTE — Progress Notes (Signed)
PROGRESS NOTE  Lori Black  HQI:[id number] DOB: 04-24-1986 DOA: 10/09/2016 PCP: No PCP Per Patient  Brief Narrative:   The patient is a 30 year old female with history of asthma who presented with flulike symptoms including fevers, chills, shortness of breath, cough. She also had 1-2 episodes of watery stools. She had minimal improvement with albuterol use at home and presented to the emergency department where she was febrile, tachycardic.  Her oxygen saturations were in the mid 90s on room air. Chest x-ray was negative for infiltrate but flu PCR was positive. Although she was initially started on antibiotics for community-acquired pneumonia, these have been discontinued and she is currently on Tamiflu. She is also on steroids and nebulizer treatments for asthma with mild improvement. She is currently on 2 L nasal cannula and we will try to transition to room air later today or tomorrow.  Assessment & Plan:   Active Problems:   Acute respiratory failure (HCC)  Acute respite where failure with hypoxia due to acute asthma exacerbation triggered by influenza A - Discontinue azithromycin - Continue Tamiflu - Decrease Solu-Medrol -  Continue duo nebs  Sinus tachycardia secondary to respiratory distress, beta agonists, flu -  Continue IV fluids -  Telemetry: Sinus tachycardia, no serious arrhythmias -  Okay to discontinue telemetry  Diarrhea likely secondary to influenza A, seems to be a common symptom this year with the flu -  Discontinue stool studies -  Continue IV fluids until diarrhea slows  Possible UTI -  Continue ceftriaxone - Follow-up urine culture  Microcytic anemia, was present several months ago also, highly suggestive of iron deficiency anemia. Patient states that she does not have heavy periods. -  I will give prescription for oral iron supplementation at discharge and she can follow-up with her primary care doctor for outpatient testing. I will not test for iron  deficiency currently because the patient has acute flu which may effect test results.  DVT prophylaxis:  Lovenox Code Status:  Full code Family Communication:  Patient alone Disposition Plan:  Pending able to tolerate ADLs without hypoxia or moderate to severe respiratory distress   Consultants:   None  Procedures:  None  Antimicrobials:  Anti-infectives    Start     Dose/Rate Route Frequency Ordered Stop   10/10/16 1200  cefTRIAXone (ROCEPHIN) 1 g in dextrose 5 % 50 mL IVPB  Status:  Discontinued     1 g 100 mL/hr over 30 Minutes Intravenous Every 24 hours 10/09/16 1204 10/09/16 1546   10/10/16 1200  azithromycin (ZITHROMAX) 500 mg in dextrose 5 % 250 mL IVPB  Status:  Discontinued     500 mg 250 mL/hr over 60 Minutes Intravenous Every 24 hours 10/09/16 1204 10/09/16 1546   10/10/16 1200  azithromycin (ZITHROMAX) 500 mg in dextrose 5 % 250 mL IVPB  Status:  Discontinued     500 mg 250 mL/hr over 60 Minutes Intravenous Every 24 hours 10/09/16 1559 10/10/16 0721   10/10/16 1200  cefTRIAXone (ROCEPHIN) 1 g in dextrose 5 % 50 mL IVPB  Status:  Discontinued     1 g 100 mL/hr over 30 Minutes Intravenous Every 24 hours 10/10/16 0731 10/10/16 0732   10/10/16 1200  cefTRIAXone (ROCEPHIN) 1 g in dextrose 5 % 50 mL IVPB     1 g 100 mL/hr over 30 Minutes Intravenous Every 24 hours 10/10/16 0732     10/10/16 1000  cefTRIAXone (ROCEPHIN) 1 g in dextrose 5 % 50 mL IVPB  Status:  Discontinued     1 g 100 mL/hr over 30 Minutes Intravenous Every 24 hours 10/09/16 1559 10/10/16 0721   10/09/16 2200  oseltamivir (TAMIFLU) capsule 75 mg     75 mg Oral 2 times daily 10/09/16 1548 10/14/16 2159   10/09/16 1600  doxycycline (VIBRAMYCIN) 100 mg in dextrose 5 % 250 mL IVPB  Status:  Discontinued     100 mg 125 mL/hr over 120 Minutes Intravenous Every 12 hours 10/09/16 1546 10/09/16 1558   10/09/16 1145  cefTRIAXone (ROCEPHIN) 1 g in dextrose 5 % 50 mL IVPB     1 g 100 mL/hr over 30 Minutes  Intravenous STAT 10/09/16 1131 10/09/16 1210   10/09/16 1130  cefTRIAXone (ROCEPHIN) 1 g in dextrose 5 % 50 mL IVPB  Status:  Discontinued     1 g 100 mL/hr over 30 Minutes Intravenous  Once 10/09/16 1121 10/09/16 1131   10/09/16 1130  azithromycin (ZITHROMAX) 500 mg in dextrose 5 % 250 mL IVPB     500 mg 250 mL/hr over 60 Minutes Intravenous  Once 10/09/16 1121 10/09/16 1315       Subjective: Feeling better than she did yesterday. She feels less [place] Halsted of breath. She is still coughing. Her diarrhea seems to be becoming less frequent. Not wheezing so much at rest.  Objective: Vitals:   10/09/16 1735 10/09/16 1826 10/09/16 2200 10/10/16 0600  BP: (!) 106/50 123/64 122/66 132/76  Pulse: (!) 134 (!) 140 (!) 131 (!) 117  Resp: (!) 28 20 20 20   Temp: 99.9 F (37.7 C) 99.7 F (37.6 C) 99.8 F (37.7 C) 98.8 F (37.1 C)  TempSrc: Oral Oral Oral Oral  SpO2: 91% 94% 96%   Weight:  73.5 kg (162 lb 1.6 oz)    Height:  5\' 6"  (1.676 m)      Intake/Output Summary (Last 24 hours) at 10/10/16 1302 Last data filed at 10/10/16 0600  Gross per 24 hour  Intake             3000 ml  Output                7 ml  Net             2993 ml   Filed Weights   10/09/16 1118 10/09/16 1826  Weight: 65.8 kg (145 lb) 73.5 kg (162 lb 1.6 oz)    Examination:  General exam:  Adult Female.  No acute distress. 2 L nasal cannula HEENT:  NCAT, MMM Respiratory system: Diminished at the bilateral bases, scattered rales, high-pitched wheeze, no rhonchi  Cardiovascular system: Sinus tachycardia, regular rhythm, normal S1/S2. No murmurs, rubs, gallops or clicks.  Warm extremities Gastrointestinal system: Normal active bowel sounds, soft, nondistended, nontender. MSK:  Normal tone and bulk, no lower extremity edema Neuro:  Grossly intact    Data Reviewed: I have personally reviewed following labs and imaging studies  CBC:  Recent Labs Lab 10/09/16 1058 10/10/16 0545  WBC 8.8 9.4  NEUTROABS 7.5 8.6*    HGB 9.7* 8.7*  HCT 30.7* 27.7*  MCV 68.7* 68.4*  PLT 162 176   Basic Metabolic Panel:  Recent Labs Lab 10/09/16 1058 10/10/16 0545  NA 135 139  K 3.2* 3.9  CL 100* 108  CO2 23 23  GLUCOSE 153* 129*  BUN 13 11  CREATININE 0.87 0.59  CALCIUM 8.3* 8.0*   GFR: Estimated Creatinine Clearance: 103.6 mL/min (by C-G formula based on SCr of 0.59 mg/dL). Liver Function Tests:  Recent Labs Lab 10/09/16 1058 10/10/16 0545  AST 22 17  ALT 20 18  ALKPHOS 47 39  BILITOT 1.0 0.5  PROT 7.0 6.4*  ALBUMIN 4.0 3.5   No results for input(s): LIPASE, AMYLASE in the last 168 hours. No results for input(s): AMMONIA in the last 168 hours. Coagulation Profile: No results for input(s): INR, PROTIME in the last 168 hours. Cardiac Enzymes: No results for input(s): CKTOTAL, CKMB, CKMBINDEX, TROPONINI in the last 168 hours. BNP (last 3 results) No results for input(s): PROBNP in the last 8760 hours. HbA1C: No results for input(s): HGBA1C in the last 72 hours. CBG: No results for input(s): GLUCAP in the last 168 hours. Lipid Profile: No results for input(s): CHOL, HDL, LDLCALC, TRIG, CHOLHDL, LDLDIRECT in the last 72 hours. Thyroid Function Tests: No results for input(s): TSH, T4TOTAL, FREET4, T3FREE, THYROIDAB in the last 72 hours. Anemia Panel: No results for input(s): VITAMINB12, FOLATE, FERRITIN, TIBC, IRON, RETICCTPCT in the last 72 hours. Urine analysis:    Component Value Date/Time   COLORURINE YELLOW 10/09/2016 1047   APPEARANCEUR CLOUDY (A) 10/09/2016 1047   LABSPEC 1.015 10/09/2016 1047   PHURINE 5.0 10/09/2016 1047   GLUCOSEU NEGATIVE 10/09/2016 1047   HGBUR SMALL (A) 10/09/2016 1047   BILIRUBINUR NEGATIVE 10/09/2016 1047   KETONESUR 5 (A) 10/09/2016 1047   PROTEINUR NEGATIVE 10/09/2016 1047   NITRITE NEGATIVE 10/09/2016 1047   LEUKOCYTESUR LARGE (A) 10/09/2016 1047   Sepsis Labs: @LABRCNTIP (procalcitonin:4,lacticidven:4)  ) Recent Results (from the past 240  hour(s))  Respiratory Panel by PCR     Status: Abnormal   Collection Time: 10/09/16  4:44 PM  Result Value Ref Range Status   Adenovirus NOT DETECTED NOT DETECTED Final   Coronavirus 229E NOT DETECTED NOT DETECTED Final   Coronavirus HKU1 NOT DETECTED NOT DETECTED Final   Coronavirus NL63 NOT DETECTED NOT DETECTED Final   Coronavirus OC43 NOT DETECTED NOT DETECTED Final   Metapneumovirus NOT DETECTED NOT DETECTED Final   Rhinovirus / Enterovirus NOT DETECTED NOT DETECTED Final   Influenza A H3 DETECTED (A) NOT DETECTED Final   Influenza B NOT DETECTED NOT DETECTED Final   Parainfluenza Virus 1 NOT DETECTED NOT DETECTED Final   Parainfluenza Virus 2 NOT DETECTED NOT DETECTED Final   Parainfluenza Virus 3 NOT DETECTED NOT DETECTED Final   Parainfluenza Virus 4 NOT DETECTED NOT DETECTED Final   Respiratory Syncytial Virus NOT DETECTED NOT DETECTED Final   Bordetella pertussis NOT DETECTED NOT DETECTED Final   Chlamydophila pneumoniae NOT DETECTED NOT DETECTED Final   Mycoplasma pneumoniae NOT DETECTED NOT DETECTED Final    Comment: Performed at [facility]      Radiology Studies: Dg Chest 2 View  Result Date: 10/09/2016 CLINICAL DATA:  Cough, congestion, shortness of breath, body aches and fever 2 days. EXAM: CHEST  2 VIEW COMPARISON:  08/05/2016 FINDINGS: Lungs are clear. Cardiomediastinal silhouette, bones and soft tissues are normal. IMPRESSION: No active cardiopulmonary disease. Electronically Signed   By: Elberta Fortis M.D.   On: 10/09/2016 11:30     Scheduled Meds: . cefTRIAXone (ROCEPHIN)  IV  1 g Intravenous Q24H  . heparin  5,000 Units Subcutaneous Q8H  . Influenza vac split quadrivalent PF  0.5 mL Intramuscular Tomorrow-1000  . levalbuterol  0.63 mg Nebulization Q4H  . methylPREDNISolone (SOLU-MEDROL) injection  60 mg Intravenous Q12H  . oseltamivir  75 mg Oral BID  . pneumococcal 23 valent vaccine  0.5 mL Intramuscular Tomorrow-1000   Continuous Infusions: .  sodium chloride 125 mL/hr at 10/09/16 1822     LOS: 0 days    Time spent: 30 min    Renae FickleSHORT, Fareed Fung, MD [facility] Pager [phone]  If 7PM-7AM, please contact night-coverage [email or web address] Password [email or web address] 10/10/2016, 1:02 PM

## 2016-10-10 NOTE — Progress Notes (Signed)
Pharmacy Antibiotic Note  Lori MolaDevin Black is a 30 y.o. female admitted on 10/09/2016 with CAP, Code sepsis.  Pharmacy was consulted 12/24 for Ceftriaxone and Azithromycin dosing.   12/25: Reconsulted for Rocephin for UTI  Microbiology results:  12/24 BCx: sent 12/24 UCx: sent  12/24 Resp panel PCR: pos for Influenza A   Plan:  Ceftriaxone 1g IV q24h  Dosage remains stable and need for further dosage adjustment appears unlikely at present.  Pharmacy will sign off at this time.  Please reconsult if a change in clinical status warrants re-evaluation of dosage.   Height: 5\' 6"  (167.6 cm) Weight: 162 lb 1.6 oz (73.5 kg) IBW/kg (Calculated) : 59.3  Temp (24hrs), Avg:100.4 F (38 C), Min:98.8 F (37.1 C), Max:102.8 F (39.3 C)   Recent Labs Lab 10/09/16 1058 10/09/16 1108 10/09/16 1413 10/10/16 0545  WBC 8.8  --   --  9.4  CREATININE 0.87  --   --  0.59  LATICACIDVEN  --  2.39* 1.43  --     Estimated Creatinine Clearance: 103.6 mL/min (by C-G formula based on SCr of 0.59 mg/dL).    No Known Allergies  Thank you for allowing pharmacy to be a part of this patient's care.  Otho BellowsGreen, Mishael Krysiak L PharmD Pager [phone] 10/10/2016, 7:30 AM

## 2016-10-10 NOTE — Progress Notes (Signed)
PHARMACY CONSULT: Lovenox for VTE prophylaxis   Wt: 73.5kg BMI:  26 Scr:  0.59 CrCl >30 ml/hr  H/H: 8.7/27.7 Pltc: 176  A/P:  Lovenox 40mg  sq q24h (standard dose) No further adjustment anticipated, Pharmacy will sign-off  Loralee PacasErin Scheryl Sanborn, PharmD, BCPS 10/10/2016 1:12 PM

## 2016-10-11 DIAGNOSIS — J45901 Unspecified asthma with (acute) exacerbation: Secondary | ICD-10-CM

## 2016-10-11 DIAGNOSIS — J111 Influenza due to unidentified influenza virus with other respiratory manifestations: Secondary | ICD-10-CM

## 2016-10-11 DIAGNOSIS — J4541 Moderate persistent asthma with (acute) exacerbation: Secondary | ICD-10-CM

## 2016-10-11 DIAGNOSIS — J101 Influenza due to other identified influenza virus with other respiratory manifestations: Secondary | ICD-10-CM

## 2016-10-11 DIAGNOSIS — D509 Iron deficiency anemia, unspecified: Secondary | ICD-10-CM

## 2016-10-11 LAB — CBC WITH DIFFERENTIAL/PLATELET
BASOS PCT: 0 %
Basophils Absolute: 0 10*3/uL (ref 0.0–0.1)
EOS PCT: 0 %
Eosinophils Absolute: 0 10*3/uL (ref 0.0–0.7)
HEMATOCRIT: 28.5 % — AB (ref 36.0–46.0)
Hemoglobin: 9 g/dL — ABNORMAL LOW (ref 12.0–15.0)
LYMPHS ABS: 1 10*3/uL (ref 0.7–4.0)
Lymphocytes Relative: 8 %
MCH: 22.1 pg — AB (ref 26.0–34.0)
MCHC: 31.6 g/dL (ref 30.0–36.0)
MCV: 69.9 fL — AB (ref 78.0–100.0)
MONO ABS: 0.5 10*3/uL (ref 0.1–1.0)
Monocytes Relative: 4 %
Neutro Abs: 11 10*3/uL — ABNORMAL HIGH (ref 1.7–7.7)
Neutrophils Relative %: 88 %
PLATELETS: 165 10*3/uL (ref 150–400)
RBC: 4.08 MIL/uL (ref 3.87–5.11)
RDW: 16.2 % — AB (ref 11.5–15.5)
WBC: 12.5 10*3/uL — ABNORMAL HIGH (ref 4.0–10.5)

## 2016-10-11 LAB — BASIC METABOLIC PANEL
Anion gap: 8 (ref 5–15)
BUN: 17 mg/dL (ref 6–20)
CO2: 24 mmol/L (ref 22–32)
Calcium: 8.4 mg/dL — ABNORMAL LOW (ref 8.9–10.3)
Chloride: 110 mmol/L (ref 101–111)
Creatinine, Ser: 0.63 mg/dL (ref 0.44–1.00)
GFR calc Af Amer: >60 mL/min (ref >60)
GLUCOSE: 132 mg/dL — AB (ref 65–99)
POTASSIUM: 3.7 mmol/L (ref 3.5–5.1)
Sodium: 142 mmol/L (ref 135–145)

## 2016-10-11 MED ORDER — OSELTAMIVIR PHOSPHATE 75 MG PO CAPS: 75.0000 mg | ORAL_CAPSULE | Freq: Two times a day (BID) | ORAL | 0 refills | Status: DC

## 2016-10-11 MED ORDER — ONDANSETRON HCL 4 MG PO TABS: 4.0000 mg | ORAL_TABLET | Freq: Three times a day (TID) | ORAL | 0 refills | Status: DC | PRN

## 2016-10-11 MED ORDER — FERROUS SULFATE 325 (65 FE) MG PO TBEC: 325.0000 mg | DELAYED_RELEASE_TABLET | Freq: Every day | ORAL | 0 refills | Status: DC

## 2016-10-11 MED ORDER — ALBUTEROL SULFATE (2.5 MG/3ML) 0.083% IN NEBU: 2.5000 mg | INHALATION_SOLUTION | Freq: Four times a day (QID) | RESPIRATORY_TRACT | 0 refills | Status: DC | PRN

## 2016-10-11 MED ORDER — ORAL CARE MOUTH RINSE
15.0000 mL | Freq: Two times a day (BID) | OROMUCOSAL | Status: DC
  Administered 2016-10-11: 15 mL via OROMUCOSAL

## 2016-10-11 MED ORDER — PREDNISONE 50 MG PO TABS: 50.0000 mg | ORAL_TABLET | Freq: Every day | ORAL | 0 refills | Status: DC

## 2016-10-11 NOTE — Discharge Summary (Addendum)
Physician Discharge Summary  Lori Black WUJ:[id number] DOB: 1986/04/01 DOA: 10/09/2016  PCP: No PCP Per Patient  Admit date: 10/09/2016 Discharge date: 10/11/2016  Admitted From: home  Disposition:  home  Recommendations for Outpatient Follow-up:  1. Follow up with PCP in 2-4 weeks to be checked for iron deficiency 2. Given tamiflu, prednisone, and refills of her albuterol nebulizer solution 3. Given once daily iron supplementation pending further testing for her anemia  Home Health:  none  Equipment/Devices:  none  Discharge Condition:  Stable, improved CODE STATUS:  full  Diet recommendation:  regular   Brief/Interim Summary:  The patient is a 30 year old female with history of asthma who presented with flulike symptoms including fevers, chills, shortness of breath, cough. She also had 1-2 episodes of watery stools. She had minimal improvement with albuterol use at home and presented to the emergency department where she was febrile, tachycardic.  Her oxygen saturations were in the mid 90s on room air. Chest x-ray was negative for infiltrate but flu PCR was positive.  She was treated with Tamiflu. She was also started on steroids and nebulizer treatments for asthma.  Although she initially required 2L nasal canula, on the date of discharge, she was able to ambulate the halls without supplemental oxygen, maintaining oxygen saturations in the mid-to-high 90s and without significant respiratory distress.   Discharge Diagnoses:  Active Problems:   Acute respiratory failure (HCC)   Asthma with acute exacerbation   Microcytic anemia   Influenza A  Acute respite where failure with hypoxia due to acute asthma exacerbation triggered by influenza A - Continue Tamiflu for 5 days - Given a 5-day burst of prednisone - refilled albuterol nebulizer solution  Sinus tachycardia secondary to respiratory distress, beta agonists, flu, improved with IVF. -  Telemetry: Sinus tachycardia, no  serious arrhythmias  Diarrhea likely secondary to influenza A, seems to be a common symptom this year with the flu.  Diarrhea slowed with treatment of the flu.  She was able to tolerate adequate PO to maintain hydration.    UTI, cystitis, present at time of admission, urine culture grew mixed flora.  She received 3 doses of ceftriaxone prior to discharge.  No further antibiotics necessary.    Microcytic anemia, was present several months ago also, highly suggestive of iron deficiency anemia. Patient states that she does not have heavy periods.   -  Gave her a prescription for oral iron supplementation at discharge and she can follow-up with her primary care doctor for outpatient testing. I will not test for iron deficiency currently because the patient has acute flu which may effect test results.  Leukocytosis, likely due to steroids.  Repeat CBC per PCP  Discharge Instructions  Discharge Instructions    Call MD for:  difficulty breathing, headache or visual disturbances    Complete by:  As directed    Call MD for:  extreme fatigue    Complete by:  As directed    Call MD for:  hives    Complete by:  As directed    Call MD for:  persistant dizziness or light-headedness    Complete by:  As directed    Call MD for:  persistant nausea and vomiting    Complete by:  As directed    Call MD for:  severe uncontrolled pain    Complete by:  As directed    Call MD for:  temperature >100.4    Complete by:  As directed    Diet general  Complete by:  As directed    Increase activity slowly    Complete by:  As directed        Medication List    TAKE these medications   albuterol 108 (90 Base) MCG/ACT inhaler Commonly known as:  PROVENTIL HFA;VENTOLIN HFA Inhale 2 puffs into the lungs every 4 (four) hours as needed for wheezing or shortness of breath.   albuterol (2.5 MG/3ML) 0.083% nebulizer solution Commonly known as:  PROVENTIL Take 3 mLs (2.5 mg total) by nebulization every 6  (six) hours as needed for wheezing or shortness of breath.   ALLERGY RELIEF PO Take 1 tablet by mouth daily. OTC store brand allergy relief pill, pt is unable to verify exact name   budesonide-formoterol 80-4.5 MCG/ACT inhaler Commonly known as:  SYMBICORT Inhale 2 puffs into the lungs 2 (two) times daily.   ferrous sulfate 325 (65 FE) MG EC tablet Take 1 tablet (325 mg total) by mouth daily with breakfast.   ondansetron 4 MG tablet Commonly known as:  ZOFRAN Take 1 tablet (4 mg total) by mouth every 8 (eight) hours as needed for nausea or vomiting.   oseltamivir 75 MG capsule Commonly known as:  TAMIFLU Take 1 capsule (75 mg total) by mouth 2 (two) times daily.   predniSONE 50 MG tablet Commonly known as:  DELTASONE Take 1 tablet (50 mg total) by mouth daily with breakfast. What changed:  how much to take  how to take this  when to take this  additional instructions       No Known Allergies  Consultations: none   Procedures/Studies: Dg Chest 2 View  Result Date: 10/09/2016 CLINICAL DATA:  Cough, congestion, shortness of breath, body aches and fever 2 days. EXAM: CHEST  2 VIEW COMPARISON:  08/05/2016 FINDINGS: Lungs are clear. Cardiomediastinal silhouette, bones and soft tissues are normal. IMPRESSION: No active cardiopulmonary disease. Electronically Signed   By: Elberta Fortis M.D.   On: 10/09/2016 11:30    none   Subjective: Feeling better today.  Able to walk to the bathroom and back OFF oxygen and without shortness of breath.  Wheezing and chest tightness, cough, and diarrhea have all improved.    Discharge Exam: Vitals:   10/10/16 2035 10/11/16 0458  BP: 128/79 110/65  Pulse: (!) 103 93  Resp: 18 18  Temp: 97.7 F (36.5 C) 98.4 F (36.9 C)   Vitals:   10/11/16 0600 10/11/16 0849 10/11/16 0910 10/11/16 1233  BP:      Pulse:      Resp:      Temp:      TempSrc:      SpO2: 98% 93% 94% 99%  Weight:      Height:        General exam:  Adult  Female.  No acute distress.  On room air HEENT:  NCAT, MMM Respiratory system:  Diminished at the bilateral bases, faint wheeze with deep breathing, no rhonchi or focal rales  Cardiovascular system: RRR, normal S1/S2. No murmurs, rubs, gallops or clicks.  Warm extremities Gastrointestinal system: Normal active bowel sounds, soft, nondistended, nontender. MSK:  Normal tone and bulk, no lower extremity edema Neuro:  Grossly intact    The results of significant diagnostics from this hospitalization (including imaging, microbiology, ancillary and laboratory) are listed below for reference.     Microbiology: Recent Results (from the past 240 hour(s))  Urine culture     Status: Abnormal   Collection Time: 10/09/16 10:47 AM  Result Value  Ref Range Status   Specimen Description URINE, RANDOM  Final   Special Requests NONE  Final   Culture MULTIPLE SPECIES PRESENT, SUGGEST RECOLLECTION (A)  Final   Report Status 10/10/2016 FINAL  Final  Blood Culture (routine x 2)     Status: None (Preliminary result)   Collection Time: 10/09/16 11:35 AM  Result Value Ref Range Status   Specimen Description BLOOD LEFT ANTECUBITAL  Final   Special Requests BOTTLES DRAWN AEROBIC AND ANAEROBIC 5CC  Final   Culture   Final    NO GROWTH 2 DAYS Performed at [facility]    Report Status PENDING  Incomplete  Blood Culture (routine x 2)     Status: None (Preliminary result)   Collection Time: 10/09/16 11:40 AM  Result Value Ref Range Status   Specimen Description BLOOD RIGHT ANTECUBITAL  Final   Special Requests IN PEDIATRIC BOTTLE 3CC  Final   Culture   Final    NO GROWTH 2 DAYS Performed at [facility]    Report Status PENDING  Incomplete  Respiratory Panel by PCR     Status: Abnormal   Collection Time: 10/09/16  4:44 PM  Result Value Ref Range Status   Adenovirus NOT DETECTED NOT DETECTED Final   Coronavirus 229E NOT DETECTED NOT DETECTED Final   Coronavirus HKU1 NOT DETECTED NOT  DETECTED Final   Coronavirus NL63 NOT DETECTED NOT DETECTED Final   Coronavirus OC43 NOT DETECTED NOT DETECTED Final   Metapneumovirus NOT DETECTED NOT DETECTED Final   Rhinovirus / Enterovirus NOT DETECTED NOT DETECTED Final   Influenza A H3 DETECTED (A) NOT DETECTED Final   Influenza B NOT DETECTED NOT DETECTED Final   Parainfluenza Virus 1 NOT DETECTED NOT DETECTED Final   Parainfluenza Virus 2 NOT DETECTED NOT DETECTED Final   Parainfluenza Virus 3 NOT DETECTED NOT DETECTED Final   Parainfluenza Virus 4 NOT DETECTED NOT DETECTED Final   Respiratory Syncytial Virus NOT DETECTED NOT DETECTED Final   Bordetella pertussis NOT DETECTED NOT DETECTED Final   Chlamydophila pneumoniae NOT DETECTED NOT DETECTED Final   Mycoplasma pneumoniae NOT DETECTED NOT DETECTED Final    Comment: Performed at [facility]     Labs: BNP (last 3 results) No results for input(s): BNP in the last 8760 hours. Basic Metabolic Panel:  Recent Labs Lab 10/09/16 1058 10/10/16 0545 10/11/16 0524  NA 135 139 142  K 3.2* 3.9 3.7  CL 100* 108 110  CO2 23 23 24   GLUCOSE 153* 129* 132*  BUN 13 11 17   CREATININE 0.87 0.59 0.63  CALCIUM 8.3* 8.0* 8.4*   Liver Function Tests:  Recent Labs Lab 10/09/16 1058 10/10/16 0545  AST 22 17  ALT 20 18  ALKPHOS 47 39  BILITOT 1.0 0.5  PROT 7.0 6.4*  ALBUMIN 4.0 3.5   No results for input(s): LIPASE, AMYLASE in the last 168 hours. No results for input(s): AMMONIA in the last 168 hours. CBC:  Recent Labs Lab 10/09/16 1058 10/10/16 0545 10/11/16 0524  WBC 8.8 9.4 12.5*  NEUTROABS 7.5 8.6* 11.0*  HGB 9.7* 8.7* 9.0*  HCT 30.7* 27.7* 28.5*  MCV 68.7* 68.4* 69.9*  PLT 162 176 165   Cardiac Enzymes: No results for input(s): CKTOTAL, CKMB, CKMBINDEX, TROPONINI in the last 168 hours. BNP: Invalid input(s): POCBNP CBG: No results for input(s): GLUCAP in the last 168 hours. D-Dimer No results for input(s): DDIMER in the last 72 hours. Hgb A1c No  results for input(s): HGBA1C  in the last 72 hours. Lipid Profile No results for input(s): CHOL, HDL, LDLCALC, TRIG, CHOLHDL, LDLDIRECT in the last 72 hours. Thyroid function studies No results for input(s): TSH, T4TOTAL, T3FREE, THYROIDAB in the last 72 hours.  Invalid input(s): FREET3 Anemia work up No results for input(s): VITAMINB12, FOLATE, FERRITIN, TIBC, IRON, RETICCTPCT in the last 72 hours. Urinalysis    Component Value Date/Time   COLORURINE YELLOW 10/09/2016 1047   APPEARANCEUR CLOUDY (A) 10/09/2016 1047   LABSPEC 1.015 10/09/2016 1047   PHURINE 5.0 10/09/2016 1047   GLUCOSEU NEGATIVE 10/09/2016 1047   HGBUR SMALL (A) 10/09/2016 1047   BILIRUBINUR NEGATIVE 10/09/2016 1047   KETONESUR 5 (A) 10/09/2016 1047   PROTEINUR NEGATIVE 10/09/2016 1047   NITRITE NEGATIVE 10/09/2016 1047   LEUKOCYTESUR LARGE (A) 10/09/2016 1047   Sepsis Labs Invalid input(s): PROCALCITONIN,  WBC,  LACTICIDVEN   Time coordinating discharge: Over 30 minutes  SIGNED:   Renae FickleSHORT, Arasely Akkerman, MD  [facility] 10/11/2016, 1:54 PM Pager   If 7PM-7AM, please contact night-coverage [email or web address] Password [email or web address]

## 2016-10-11 NOTE — Progress Notes (Signed)
Pt discharged from the unit via wheelchair. Discharge instructions were reviewed with the pt. Pt had no questions or concerns at this time.  Malissie Musgrave W Roneka Gilpin, RN

## 2016-10-14 LAB — CULTURE, BLOOD (ROUTINE X 2)
CULTURE: NO GROWTH
Culture: NO GROWTH

## 2016-10-19 ENCOUNTER — Encounter (HOSPITAL_COMMUNITY): Payer: Self-pay | Admitting: Emergency Medicine

## 2016-10-19 ENCOUNTER — Emergency Department (HOSPITAL_COMMUNITY)
Admission: EM | Admit: 2016-10-19 | Discharge: 2016-10-20 | Disposition: A | Discharge status: Home / Self Care | Payer: Medicare Other | Attending: Emergency Medicine | Admitting: Emergency Medicine

## 2016-10-19 DIAGNOSIS — Z79899 Other long term (current) drug therapy: Secondary | ICD-10-CM | POA: Insufficient documentation

## 2016-10-19 DIAGNOSIS — R112 Nausea with vomiting, unspecified: Secondary | ICD-10-CM | POA: Diagnosis present

## 2016-10-19 DIAGNOSIS — J45909 Unspecified asthma, uncomplicated: Secondary | ICD-10-CM | POA: Diagnosis not present

## 2016-10-19 DIAGNOSIS — E860 Dehydration: Secondary | ICD-10-CM

## 2016-10-19 MED ORDER — SODIUM CHLORIDE 0.9 % IV BOLUS (SEPSIS)
1000.0000 mL | Freq: Once | INTRAVENOUS | Status: AC
  Administered 2016-10-19: 1000 mL via INTRAVENOUS

## 2016-10-19 MED ORDER — ONDANSETRON HCL 4 MG/2ML IJ SOLN
4.0000 mg | Freq: Once | INTRAMUSCULAR | Status: AC
  Administered 2016-10-19: 4 mg via INTRAVENOUS
  Filled 2016-10-19: qty 2

## 2016-10-19 MED ORDER — KETOROLAC TROMETHAMINE 30 MG/ML IJ SOLN
30.0000 mg | Freq: Once | INTRAMUSCULAR | Status: AC
  Administered 2016-10-19: 30 mg via INTRAVENOUS
  Filled 2016-10-19: qty 1

## 2016-10-19 NOTE — ED Triage Notes (Signed)
Patient reports bilateral hand, feet, and face numbness with generalized weakness x2 hours. Patient also reports 3 episodes of vomiting. States she has a recent dx of the flu. Denies chest pain, abdominal pain, and SOB.

## 2016-10-20 MED ORDER — ONDANSETRON 8 MG PO TBDP: 8.0000 mg | ORAL_TABLET | Freq: Three times a day (TID) | ORAL | 0 refills | Status: DC | PRN

## 2016-10-20 NOTE — ED Notes (Signed)
Pt ambulatory and independent at discharge.  Verbalized understanding of discharge instructions 

## 2016-10-20 NOTE — ED Provider Notes (Signed)
[facility]-EMERGENCY DEPT Provider Note   CSN: [id number] Arrival date & time: 10/19/16  2032     History   Chief Complaint Chief Complaint  Patient presents with  . Numbness    HPI Lori Black is a 31 y.o. female.  HPI Patient reports recently evaluated and treated for influenza last week.  She felt like she is beginning to get better and then today she developed nausea and vomiting and feels a numbness sensation across her bilateral arms and her face.  She denies diarrhea.  No unilateral arm or leg weakness.  No abdominal pain.  Denies cough or shortness breath.   Past Medical History:  Diagnosis Date  . Asthma     Patient Active Problem List   Diagnosis Date Noted  . Asthma with acute exacerbation 10/11/2016  . Microcytic anemia 10/11/2016  . Influenza A 10/11/2016  . Acute respiratory failure (HCC) 10/09/2016    Past Surgical History:  Procedure Laterality Date  . CESAREAN SECTION      OB History    No data available       Home Medications    Prior to Admission medications   Medication Sig Start Date End Date Taking? Authorizing Provider  albuterol (PROVENTIL HFA;VENTOLIN HFA) 108 (90 Base) MCG/ACT inhaler Inhale 2 puffs into the lungs every 4 (four) hours as needed for wheezing or shortness of breath.   Yes Historical Provider, MD  albuterol (PROVENTIL) (2.5 MG/3ML) 0.083% nebulizer solution Take 3 mLs (2.5 mg total) by nebulization every 6 (six) hours as needed for wheezing or shortness of breath. 10/11/16  Yes Renae Fickle, MD  budesonide-formoterol (SYMBICORT) 80-4.5 MCG/ACT inhaler Inhale 2 puffs into the lungs 2 (two) times daily. 08/13/16  Yes Donnetta Hutching, MD  Chlorpheniramine Maleate (ALLERGY RELIEF PO) Take 1 tablet by mouth daily. OTC store brand allergy relief pill, pt is unable to verify exact name   Yes Historical Provider, MD  ferrous sulfate 325 (65 FE) MG EC tablet Take 1 tablet (325 mg total) by mouth daily with breakfast. 10/11/16  Yes Renae Fickle, MD  ondansetron (ZOFRAN) 4 MG tablet Take 1 tablet (4 mg total) by mouth every 8 (eight) hours as needed for nausea or vomiting. Patient not taking: Reported on 10/19/2016 10/11/16   Renae Fickle, MD  oseltamivir (TAMIFLU) 75 MG capsule Take 1 capsule (75 mg total) by mouth 2 (two) times daily. Patient not taking: Reported on 10/19/2016 10/11/16   Renae Fickle, MD  predniSONE (DELTASONE) 50 MG tablet Take 1 tablet (50 mg total) by mouth daily with breakfast. Patient not taking: Reported on 10/19/2016 10/11/16   Renae Fickle, MD    Family History History reviewed. No pertinent family history.  Social History Social History  Substance Use Topics  . Smoking status: Never Smoker  . Smokeless tobacco: Never Used  . Alcohol use Yes     Comment: occassionally     Allergies   Patient has no known allergies.   Review of Systems Review of Systems  All other systems reviewed and are negative.    Physical Exam Updated Vital Signs BP 103/74 (BP Location: Left Arm)   Pulse 91   Temp 99.2 F (37.3 C) (Oral)   Resp 16   LMP 10/18/2016   SpO2 100%   Physical Exam  Constitutional: She is oriented to person, place, and time. She appears well-developed and well-nourished. No distress.  HENT:  Head: Normocephalic and atraumatic.  Eyes: EOM are normal.  Neck: Normal range of motion.  Cardiovascular: Normal rate, regular rhythm and normal heart sounds.   Pulmonary/Chest: Effort normal and breath sounds normal.  Abdominal: Soft. She exhibits no distension. There is no tenderness.  Musculoskeletal: Normal range of motion.  Neurological: She is alert and oriented to person, place, and time.  Skin: Skin is warm and dry.  Psychiatric: She has a normal mood and affect. Judgment normal.  Nursing note and vitals reviewed.    ED Treatments / Results  Labs (all labs ordered are listed, but only abnormal results are displayed) Labs Reviewed - No data to display  EKG  EKG  Interpretation None       Radiology No results found.  Procedures Procedures (including critical care time)  Medications Ordered in ED Medications  sodium chloride 0.9 % bolus 1,000 mL (1,000 mLs Intravenous New Bag/Given 10/19/16 2345)  ketorolac (TORADOL) 30 MG/ML injection 30 mg (30 mg Intravenous Given 10/19/16 2342)  ondansetron (ZOFRAN) injection 4 mg (4 mg Intravenous Given 10/19/16 2342)     Initial Impression / Assessment and Plan / ED Course  I have reviewed the triage vital signs and the nursing notes.  Pertinent labs & imaging results that were available during my care of the patient were reviewed by me and considered in my medical decision making (see chart for details).  Clinical Course     Feels better after IV fluids.  Nonfocal neurologic exam.  Discharge home in good condition.  Primary care follow-up.  Home with Zofran.  She understands return to the ER for new or worsening symptoms  Final Clinical Impressions(s) / ED Diagnoses   Final diagnoses:  None    New Prescriptions New Prescriptions   No medications on file     Azalia BilisKevin Angelyna Henderson, MD 10/20/16 [phone]

## 2016-12-23 DIAGNOSIS — J4520 Mild intermittent asthma, uncomplicated: Secondary | ICD-10-CM | POA: Diagnosis not present

## 2016-12-23 DIAGNOSIS — J019 Acute sinusitis, unspecified: Secondary | ICD-10-CM | POA: Diagnosis not present

## 2017-01-12 ENCOUNTER — Emergency Department (HOSPITAL_COMMUNITY)
Admission: EM | Admit: 2017-01-12 | Discharge: 2017-01-12 | Disposition: A | Discharge status: Home / Self Care | Payer: Medicare Other | Attending: Emergency Medicine | Admitting: Emergency Medicine

## 2017-01-12 ENCOUNTER — Emergency Department (HOSPITAL_COMMUNITY): Payer: Medicare Other

## 2017-01-12 ENCOUNTER — Encounter (HOSPITAL_COMMUNITY): Payer: Self-pay | Admitting: Emergency Medicine

## 2017-01-12 DIAGNOSIS — R0602 Shortness of breath: Secondary | ICD-10-CM | POA: Diagnosis present

## 2017-01-12 DIAGNOSIS — J4541 Moderate persistent asthma with (acute) exacerbation: Secondary | ICD-10-CM

## 2017-01-12 DIAGNOSIS — R05 Cough: Secondary | ICD-10-CM | POA: Diagnosis not present

## 2017-01-12 MED ORDER — ALBUTEROL SULFATE (2.5 MG/3ML) 0.083% IN NEBU
5.0000 mg | INHALATION_SOLUTION | Freq: Once | RESPIRATORY_TRACT | Status: AC
  Administered 2017-01-12: 5 mg via RESPIRATORY_TRACT
  Filled 2017-01-12: qty 6

## 2017-01-12 MED ORDER — PREDNISONE 20 MG PO TABS
60.0000 mg | ORAL_TABLET | Freq: Once | ORAL | Status: AC
  Administered 2017-01-12: 60 mg via ORAL
  Filled 2017-01-12: qty 3

## 2017-01-12 MED ORDER — IPRATROPIUM BROMIDE 0.02 % IN SOLN
0.5000 mg | Freq: Once | RESPIRATORY_TRACT | Status: AC
  Administered 2017-01-12: 0.5 mg via RESPIRATORY_TRACT
  Filled 2017-01-12: qty 2.5

## 2017-01-12 MED ORDER — PREDNISONE 10 MG PO TABS: 60.0000 mg | ORAL_TABLET | Freq: Every day | ORAL | 0 refills | Status: DC

## 2017-01-12 NOTE — ED Triage Notes (Addendum)
Pt c/o abdominal pain, diarrhea, and sinusitis, onset Monday, followed by SOB, activity intolerance, and productive cough with yellow sputum onset Tuesday, fever onset last night up to 101.1 F. Self-treating with nebulizer treatments, nasal spray, inhaler with minimal relief, had 2 albuterol nebulizer txs today.

## 2017-01-12 NOTE — ED Provider Notes (Signed)
[facility]-EMERGENCY DEPT Provider Note   CSN: [id number] Arrival date & time: 01/12/17  [id number]     History   Chief Complaint Chief Complaint  Patient presents with  . Shortness of Breath  . Abdominal Pain    HPI Lori Black is a 31 y.o. female.  HPI Patient presents to the emergency department with complaints of shortness of breath and cough as well as sputum production and low-grade fevers.  She reports a fever 201 last night.  She's been treated with nebulized treatments without improvement in her symptoms.  She has a history of asthma.  She denies abdominal pain at this time.  She does report some diarrhea.  She reports increasing nasal congestion over the past several days.   Past Medical History:  Diagnosis Date  . Asthma     Patient Active Problem List   Diagnosis Date Noted  . Asthma with acute exacerbation 10/11/2016  . Microcytic anemia 10/11/2016  . Influenza A 10/11/2016  . Acute respiratory failure (HCC) 10/09/2016    Past Surgical History:  Procedure Laterality Date  . CESAREAN SECTION      OB History    No data available       Home Medications    Prior to Admission medications   Medication Sig Start Date End Date Taking? Authorizing Provider  albuterol (PROVENTIL HFA;VENTOLIN HFA) 108 (90 Base) MCG/ACT inhaler Inhale 2 puffs into the lungs every 4 (four) hours as needed for wheezing or shortness of breath.   Yes Historical Provider, MD  albuterol (PROVENTIL) (2.5 MG/3ML) 0.083% nebulizer solution Take 3 mLs (2.5 mg total) by nebulization every 6 (six) hours as needed for wheezing or shortness of breath. 10/11/16  Yes Renae FickleMackenzie Short, MD  budesonide-formoterol (SYMBICORT) 80-4.5 MCG/ACT inhaler Inhale 2 puffs into the lungs 2 (two) times daily. 08/13/16  Yes Donnetta HutchingBrian Cook, MD  Chlorpheniramine Maleate (ALLERGY RELIEF PO) Take 1 tablet by mouth daily. pt is unable to verify exact name   Yes Historical Provider, MD  DiphenhydrAMINE HCl (ALLERGY MEDICATION PO)  Take 1 tablet by mouth daily. Pt is unable to verify name of medication   Yes Historical Provider, MD  Phenyleph-Doxylamine-DM-APAP (NYQUIL SEVERE COLD/FLU) 5-6.25-10-325 MG/15ML LIQD Take 30 mLs by mouth once.   Yes Historical Provider, MD  pseudoephedrine-acetaminophen (TYLENOL SINUS) 30-500 MG TABS tablet Take 2 tablets by mouth daily as needed (for cold).   Yes Historical Provider, MD  ferrous sulfate 325 (65 FE) MG EC tablet Take 1 tablet (325 mg total) by mouth daily with breakfast. 10/11/16   Renae FickleMackenzie Short, MD  ondansetron (ZOFRAN ODT) 8 MG disintegrating tablet Take 1 tablet (8 mg total) by mouth every 8 (eight) hours as needed for nausea or vomiting. 10/20/16   Azalia BilisKevin Author [place], MD  ondansetron (ZOFRAN) 4 MG tablet Take 1 tablet (4 mg total) by mouth every 8 (eight) hours as needed for nausea or vomiting. Patient not taking: Reported on 10/19/2016 10/11/16   Renae FickleMackenzie Short, MD  predniSONE (DELTASONE) 10 MG tablet Take 6 tablets (60 mg total) by mouth daily. 01/12/17   Azalia BilisKevin San Lohmeyer, MD    Family History History reviewed. No pertinent family history.  Social History Social History  Substance Use Topics  . Smoking status: Never Smoker  . Smokeless tobacco: Never Used  . Alcohol use Yes     Comment: occassionally     Allergies   Patient has no known allergies.   Review of Systems Review of Systems  All other systems reviewed and are negative.  Physical Exam Updated Vital Signs BP 129/84 (BP Location: Left Arm)   Pulse (!) 125   Temp 99 F (37.2 C) (Oral)   Resp 20   LMP 12/29/2016   SpO2 94%   Physical Exam  Constitutional: She is oriented to person, place, and time. She appears well-developed and well-nourished. No distress.  HENT:  Head: Normocephalic and atraumatic.  Eyes: EOM are normal.  Neck: Normal range of motion.  Cardiovascular: Regular rhythm and normal heart sounds.   Mild tachycardia  Pulmonary/Chest: Effort normal. She has wheezes.  Abdominal: Soft.  She exhibits no distension. There is no tenderness.  Musculoskeletal: Normal range of motion.  Neurological: She is alert and oriented to person, place, and time.  Skin: Skin is warm and dry.  Psychiatric: She has a normal mood and affect. Judgment normal.  Nursing note and vitals reviewed.    ED Treatments / Results  Labs (all labs ordered are listed, but only abnormal results are displayed) Labs Reviewed - No data to display  EKG  EKG Interpretation None       Radiology Dg Chest 2 View  Result Date: 01/12/2017 CLINICAL DATA:  Cough and congestion EXAM: CHEST  2 VIEW COMPARISON:  10/09/2016 FINDINGS: The heart size and mediastinal contours are within normal limits. Both lungs are clear. The visualized skeletal structures are unremarkable. IMPRESSION: No active cardiopulmonary disease. Electronically Signed   By: Alcide Clever M.D.   On: 01/12/2017 10:26    Procedures Procedures (including critical care time)  Medications Ordered in ED Medications  albuterol (PROVENTIL) (2.5 MG/3ML) 0.083% nebulizer solution 5 mg (5 mg Nebulization Given 01/12/17 1028)  ipratropium (ATROVENT) nebulizer solution 0.5 mg (0.5 mg Nebulization Given 01/12/17 1032)  predniSONE (DELTASONE) tablet 60 mg (60 mg Oral Given 01/12/17 1029)     Initial Impression / Assessment and Plan / ED Course  I have reviewed the triage vital signs and the nursing notes.  Pertinent labs & imaging results that were available during my care of the patient were reviewed by me and considered in my medical decision making (see chart for details).     Chest x-ray clear.  Feels much better after albuterol and Atrovent treatment here in emergency department.  Prednisone given.  Home with steroid bursts over the next 5 days.  She understands return to the ER for new or worsening symptoms.  Abdominal exam is benign.  No tenderness.  No urinary symptoms.  Discharge home in good condition.  Primary care follow-up.  Final  Clinical Impressions(s) / ED Diagnoses   Final diagnoses:  Moderate persistent asthma with exacerbation    New Prescriptions New Prescriptions   PREDNISONE (DELTASONE) 10 MG TABLET    Take 6 tablets (60 mg total) by mouth daily.     Azalia Bilis, MD 01/12/17 1115

## 2017-01-12 NOTE — ED Notes (Signed)
RN is collecting labs on pt.

## 2017-02-16 DIAGNOSIS — J4520 Mild intermittent asthma, uncomplicated: Secondary | ICD-10-CM | POA: Diagnosis not present

## 2017-02-16 DIAGNOSIS — J0141 Acute recurrent pansinusitis: Secondary | ICD-10-CM | POA: Diagnosis not present

## 2017-02-16 DIAGNOSIS — J029 Acute pharyngitis, unspecified: Secondary | ICD-10-CM | POA: Diagnosis not present

## 2017-02-16 DIAGNOSIS — R0602 Shortness of breath: Secondary | ICD-10-CM | POA: Diagnosis not present

## 2017-03-07 ENCOUNTER — Emergency Department (HOSPITAL_COMMUNITY): Payer: Medicare Other

## 2017-03-07 ENCOUNTER — Emergency Department (HOSPITAL_COMMUNITY)
Admission: EM | Admit: 2017-03-07 | Discharge: 2017-03-07 | Disposition: A | Discharge status: Home / Self Care | Payer: Medicare Other | Attending: Physician Assistant | Admitting: Physician Assistant

## 2017-03-07 ENCOUNTER — Encounter (HOSPITAL_COMMUNITY): Payer: Self-pay | Admitting: Emergency Medicine

## 2017-03-07 DIAGNOSIS — R05 Cough: Secondary | ICD-10-CM | POA: Diagnosis not present

## 2017-03-07 DIAGNOSIS — J4521 Mild intermittent asthma with (acute) exacerbation: Secondary | ICD-10-CM | POA: Insufficient documentation

## 2017-03-07 MED ORDER — IPRATROPIUM-ALBUTEROL 0.5-2.5 (3) MG/3ML IN SOLN
3.0000 mL | Freq: Once | RESPIRATORY_TRACT | Status: AC
  Administered 2017-03-07: 3 mL via RESPIRATORY_TRACT
  Filled 2017-03-07: qty 3

## 2017-03-07 MED ORDER — PREDNISONE 20 MG PO TABS
60.0000 mg | ORAL_TABLET | Freq: Once | ORAL | Status: AC
  Administered 2017-03-07: 60 mg via ORAL
  Filled 2017-03-07: qty 3

## 2017-03-07 MED ORDER — PREDNISONE 20 MG PO TABS: 60.0000 mg | ORAL_TABLET | Freq: Every day | ORAL | 0 refills | Status: AC

## 2017-03-07 NOTE — Discharge Instructions (Signed)
Please read and follow all provided instructions.  Your diagnoses today include:  1. Mild intermittent asthma with exacerbation     Tests performed today include: Chest Xray Vital signs. See below for your results today.   Medications prescribed:   Take any prescribed medications only as directed.  Home care instructions:  Follow any educational materials contained in this packet.  Follow-up instructions: Please follow-up with your primary care provider in the next 3 days for further evaluation of your symptoms and management of your asthma.  Return instructions:  Please return to the Emergency Department if you experience worsening symptoms. Please return with worsening wheezing, shortness of breath, or difficulty breathing. Return with persistent fever above 101F.  Please return if you have any other emergent concerns.  Additional Information:  Your vital signs today were: BP 138/71 (BP Location: Left Arm)    Pulse (!) 120    Temp 99.9 F (37.7 C) (Oral)    Resp 20    Ht 5\' 6"  (1.676 m)    Wt 68.2 kg (150 lb 4 oz)    LMP 02/14/2017 (Exact Date)    SpO2 97%    BMI 24.25 kg/m  If your blood pressure (BP) was elevated above 135/85 this visit, please have this repeated by your doctor within one month. --------------

## 2017-03-07 NOTE — ED Triage Notes (Signed)
Patient with history of asthma has been having SOB, sore throat, O2 levels have been high 80's low 90's.  Denies nausea, vomiting, or diarrhea.  Patient having chest pain with her cough.

## 2017-03-07 NOTE — ED Provider Notes (Signed)
[facility]-EMERGENCY DEPT Provider Note   CSN: [id number] Arrival date & time: 03/07/17  1736   By signing my name below, I, Paschal Dopp, attest that this documentation has been prepared under the direction and in the presence of Audry Pili, PA-C. Electronically Signed: Paschal Dopp, Scribe. 03/07/2017. 6:00 PM.  History   Chief Complaint Chief Complaint  Patient presents with  . sore throat, asthma   The history is provided by the patient. No language interpreter was used.   Marland KitchenHPI Comments:  Lori Black is a 31 y.o. female who presents to the Emergency Department complaining of cough x a few days. Pt has a h/o asthma and notes O2 saturations in the high 80's low 90's since yesterday. She has associated sx of chest pressure, sore throat and mild wheezing.  She reports taking albuterol neb treatment  a few times a day which provides mild temporary relief  She denies fever. She notes her symptoms are worse  upon exertion. She also reports h/o intubation in the past secondary to asthma exacerbation; was last intubated in 2012 was last time she had. Pt also notes sick contacts at home with similar symptoms (her daughter). No other symptoms noted.   Past Medical History:  Diagnosis Date  . Asthma     Patient Active Problem List   Diagnosis Date Noted  . Asthma with acute exacerbation 10/11/2016  . Microcytic anemia 10/11/2016  . Influenza A 10/11/2016  . Acute respiratory failure (HCC) 10/09/2016    Past Surgical History:  Procedure Laterality Date  . CESAREAN SECTION      OB History    No data available       Home Medications    Prior to Admission medications   Medication Sig Start Date End Date Taking? Authorizing Provider  albuterol (PROVENTIL HFA;VENTOLIN HFA) 108 (90 Base) MCG/ACT inhaler Inhale 2 puffs into the lungs every 4 (four) hours as needed for wheezing or shortness of breath.   Yes [provider]  albuterol (PROVENTIL) (2.5 MG/3ML) 0.083% nebulizer  solution Take 3 mLs (2.5 mg total) by nebulization every 6 (six) hours as needed for wheezing or shortness of breath. 10/11/16  Yes Short, Thea Silversmith, MD  budesonide-formoterol (SYMBICORT) 80-4.5 MCG/ACT inhaler Inhale 2 puffs into the lungs 2 (two) times daily. 08/13/16  Yes Donnetta Hutching, MD  ferrous sulfate 325 (65 FE) MG EC tablet Take 1 tablet (325 mg total) by mouth daily with breakfast. Patient not taking: Reported on 01/12/2017 10/11/16   Renae Fickle, MD  ondansetron (ZOFRAN ODT) 8 MG disintegrating tablet Take 1 tablet (8 mg total) by mouth every 8 (eight) hours as needed for nausea or vomiting. Patient not taking: Reported on 01/12/2017 10/20/16   Azalia Bilis, MD  predniSONE (DELTASONE) 10 MG tablet Take 6 tablets (60 mg total) by mouth daily. Patient not taking: Reported on 03/07/2017 01/12/17   Azalia Bilis, MD    Family History No family history on file.  Social History Social History  Substance Use Topics  . Smoking status: Never Smoker  . Smokeless tobacco: Never Used  . Alcohol use Yes     Comment: occassionally     Allergies   Patient has no known allergies.   Review of Systems Review of Systems  Constitutional: Negative for fever.  HENT: Positive for sore throat.   Respiratory: Positive for cough and wheezing.   Cardiovascular: Positive for chest pain.     Physical Exam Updated Vital Signs BP 138/71 (BP Location: Left Arm)   Pulse Marland Kitchen)  120   Temp 99.9 F (37.7 C) (Oral)   Resp 20   Ht 5\' 6"  (1.676 m)   Wt 150 lb 4 oz (68.2 kg)   LMP 02/14/2017 (Exact Date)   SpO2 97%   BMI 24.25 kg/m   Physical Exam  Constitutional: She is oriented to person, place, and time. She appears well-developed and well-nourished. No distress.  NAD   HENT:  Head: Normocephalic and atraumatic.  Right Ear: Tympanic membrane, external ear and ear canal normal.  Left Ear: Tympanic membrane, external ear and ear canal normal.  Nose: Nose normal.  Mouth/Throat: Uvula is  midline, oropharynx is clear and moist and mucous membranes are normal. No trismus in the jaw. No oropharyngeal exudate, posterior oropharyngeal erythema or tonsillar abscesses.  Eyes: Conjunctivae and EOM are normal. Pupils are equal, round, and reactive to light.  Neck: Normal range of motion. Neck supple. No tracheal deviation present.  Cardiovascular: Regular rhythm, S1 normal, S2 normal, normal heart sounds, intact distal pulses and normal pulses.  Tachycardia present.   Tachycardia after albuterol usage  Pulmonary/Chest: Effort normal and breath sounds normal. No tachypnea. No respiratory distress. She has no decreased breath sounds. She has no wheezes. She has no rhonchi. She has no rales.  Abdominal: Normal appearance and bowel sounds are normal. She exhibits no distension. There is no tenderness.  Musculoskeletal: Normal range of motion.  Neurological: She is alert and oriented to person, place, and time.  Skin: Skin is warm and dry.  Psychiatric: She has a normal mood and affect. Her speech is normal and behavior is normal. Thought content normal.  Nursing note and vitals reviewed.    ED Treatments / Results  DIAGNOSTIC STUDIES:  Oxygen Saturation is 97% on RA, normal by my interpretation.    COORDINATION OF CARE:  6:00 PM Discussed treatment plan with pt at bedside and pt agreed to plan.  Labs (all labs ordered are listed, but only abnormal results are displayed) Labs Reviewed - No data to display  EKG  EKG Interpretation None       Radiology Dg Chest 2 View  Result Date: 03/07/2017 CLINICAL DATA:  Cough EXAM: CHEST  2 VIEW COMPARISON:  01/12/2017 FINDINGS: The heart size and mediastinal contours are within normal limits. Both lungs are clear. The visualized skeletal structures are unremarkable. IMPRESSION: No active cardiopulmonary disease. Electronically Signed   By: Jasmine Pang M.D.   On: 03/07/2017 18:35    Procedures Procedures (including critical care  time)  Medications Ordered in ED Medications  ipratropium-albuterol (DUONEB) 0.5-2.5 (3) MG/3ML nebulizer solution 3 mL (not administered)     Initial Impression / Assessment and Plan / ED Course  I have reviewed the triage vital signs and the nursing notes.  Pertinent labs & imaging results that were available during my care of the patient were reviewed by me and considered in my medical decision making (see chart for details).   {I have reviewed and evaluated the relevant imaging studies.  {I have reviewed the relevant previous healthcare records.  {I obtained HPI from historian.   ED Course:  Assessment: Pt is a 31 y.o. female with hx Asthma who presents with Asthma exacerbation x 2-3 days. No fevers. On exam, pt in NAD. Nontoxic/nonseptic appearing. VSS. Afebrile. Lungs without obvious wheeze. Heart RRR. Abdomen nontender soft. CXR negative for acute infiltrate. Given albuterol, steroids with improvement. Plan is to DC Home with follow up to PCP. Given Rx Steroids and Albuterol. At time of discharge, Patient  is in no acute distress. Vital Signs are stable. Patient is able to ambulate. Patient able to tolerate PO.    Disposition/Plan:  DC Home Additional Verbal discharge instructions given and discussed with patient.  Pt Instructed to f/u with PCP in the next week for evaluation and treatment of symptoms. Return precautions given Pt acknowledges and agrees with plan  Supervising Physician Mackuen, Courteney Lyn, *  Final Clinical Impressions(s) / ED Diagnoses   Final diagnoses:  Mild intermittent asthma with exacerbation    New Prescriptions New Prescriptions   No medications on file   I personally performed the services described in this documentation, which was scribed in my presence. The recorded information has been reviewed and is accurate.    Audry PiliMohr, Grethel Zenk, PA-C 03/07/17 1930    Abelino DerrickMackuen, Courteney Lyn, MD 03/08/17 2219    Abelino DerrickMackuen, Courteney Lyn, MD 03/08/17  2219

## 2017-03-17 ENCOUNTER — Encounter: Payer: Self-pay | Admitting: Internal Medicine

## 2017-03-17 ENCOUNTER — Ambulatory Visit (INDEPENDENT_AMBULATORY_CARE_PROVIDER_SITE_OTHER): Payer: Medicare Other | Admitting: Internal Medicine

## 2017-03-17 VITALS — BP 140/88 | HR 89 | Ht 66.0 in | Wt 150.0 lb

## 2017-03-17 DIAGNOSIS — J45901 Unspecified asthma with (acute) exacerbation: Secondary | ICD-10-CM | POA: Diagnosis not present

## 2017-03-17 LAB — NITRIC OXIDE: Nitric Oxide: 30

## 2017-03-17 MED ORDER — BUDESONIDE-FORMOTEROL FUMARATE 160-4.5 MCG/ACT IN AERO: 2.0000 | INHALATION_SPRAY | Freq: Two times a day (BID) | RESPIRATORY_TRACT | 0 refills | Status: DC

## 2017-03-17 NOTE — Progress Notes (Signed)
Subjective:     Patient ID: Lori Black, female   DOB: [birth year],     MRN: [id number]  HPI   [age] yowf never smoker asthma since childhood never free of symptoms or need for inhaler and frequent prednisone rx x every frew months [facility] on symbicort fall 2017 with no change pattern > referred to pulmonary clinic 03/17/2017 by EDP p 3rd ER trip in 6 months with one admit for asthma       03/17/2017 [facility] office visit/ Lori Black   Chief Complaint  Patient presents with  . Pulmonary Consult    Self referral. Pt states that she was dxed with Asthma at birth. [place] She has been out of her Symbicort for the past month. She has been having increased SOB and cough with thick, yellow sputum recently.  She has been using albuterol neb and albuterol inhaler at least once per day.   just took last prednisone on day of ov Out of symb x sev weeks   But whne on symb and pred only needs neb once a day  Has assoc nasal congestions and rattling cough worse at hs and in AM Baseline hfa very poor - see hfa   No obvious other day to day or daytime variability or assoc  Asa intol or  mucus plugs or hemoptysis or cp or chest tightness,   or overt sinus or hb symptoms. No unusual exp hx or h/o childhood pna/ asthma or knowledge of premature birth.   Also denies any obvious fluctuation of symptoms with weather or environmental changes or other aggravating or alleviating factors except as outlined above   Current Medications, Allergies, Complete Past Medical History, Past Surgical History, Family History, and Social History were reviewed in [facility] electronic [facility].  ROS  The following are not active complaints unless bolded sore throat, dysphagia, dental problems, itching, sneezing,  nasal congestion or excess/ purulent secretions, ear aches,   fever, chills, sweats, unintended wt loss, classically pleuritic or exertional cp,  orthopnea pnd or leg  swelling, presyncope, palpitations, abdominal pain, anorexia, nausea, vomiting, diarrhea  or change in bowel or bladder habits, change in stools or urine, dysuria,hematuria,  rash, arthralgias, visual complaints, headache, numbness, weakness or ataxia or problems with walking or coordination,  change in mood/affect or memory.           Review of Systems     Objective:   Physical Exam    amb wf with sltly congested sounding cough  Wt Readings from Last 3 Encounters:  03/17/17 150 lb (68 kg)  03/07/17 150 lb 4 oz (68.2 kg)  10/09/16 162 lb 1.6 oz (73.5 kg)    Vital signs reviewed  - Note on arrival 02 sats  98% on RA     HEENT: nl dentition,  and oropharynx. Nl external ear canals without cough reflex - moderate bilateral non-specific turbinate edema     NECK :  without JVD/Nodes/TM/ nl carotid upstrokes bilaterally   LUNGS: no acc muscle use,  Nl contour chest with mild insp and exp rhonchi bilaterally    CV:  RRR  no s3 or murmur or increase in P2, and no edema   ABD:  soft and nontender with nl inspiratory excursion in the supine position. No bruits or organomegaly appreciated, bowel sounds nl  MS:  Nl gait/ ext warm without deformities, calf tenderness, cyanosis or clubbing No obvious joint restrictions   SKIN: warm and  dry without lesions    NEURO:  alert, approp, nl sensorium with  no motor or cerebellar deficits apparent.      I personally reviewed images and agree with radiology impression as follows:  CXR:   03/07/17 No active cardiopulmonary disease.    Assessment:

## 2017-03-17 NOTE — Patient Instructions (Signed)
Plan A = Automatic = symbicort 160 Take 2 puffs first thing in am and then another 2 puffs about 12 hours later.    Work on inhaler technique:  relax and gently blow all the way out then take a nice smooth deep breath back in, triggering the inhaler at same time you start breathing in.  Hold for up to 5 seconds if you can. Blow out thru nose. Rinse and gargle with water when done      Plan B = Backup Only use your albuterol as a rescue medication to be used if you can't catch your breath by resting or doing a relaxed purse lip breathing pattern.  - The less you use it, the better it will work when you need it. - Ok to use the inhaler up to 2 puffs  every 4 hours if you must but call for appointment if use goes up over your usual need - Don't leave home without it !!  (think of it like the spare tire for your car)   Plan C = Crisis - only use your albuterol nebulizer if you first try Plan B and it fails to help > ok to use the nebulizer up to every 4 hours but if start needing it regularly call for immediate appointment    Please schedule a follow up office visit in 4 weeks, sooner if needed

## 2017-03-17 NOTE — Assessment & Plan Note (Addendum)
FENO 03/17/2017  =   30 on final day of prednisone - Spirometry 03/17/2017  wnl s curvature on last day of pred p saba 5 h prior  - 03/17/2017  After extensive coaching HFA effectiveness =    75% from baseline 25 %   DDX of  difficult airways management almost all start with A and  include Adherence, Ace Inhibitors, Acid Reflux, Active Sinus Disease, Alpha 1 Antitripsin deficiency, Anxiety masquerading as Airways dz,  ABPA,  Allergy(esp in young), Aspiration (esp in elderly), Adverse effects of meds,  Active smokers, A bunch of PE's (a small clot burden can't cause this syndrome unless there is already severe underlying pulm or vascular dz with poor reserve) plus two Bs  = Bronchiectasis and Beta blocker use..and one C= CHF  In this case Adherence is the biggest issue and starts with  inability to use HFA effectively and also  understand that SABA treats the symptoms but doesn't get to the underlying problem (inflammation).  I used  the analogy of putting steroid cream on a rash to help explain the meaning of topical therapy and the need to get the drug to the target tissue.   - see hfa teaching - pt assist forms completed today    ? Allergy > check profile off prednisone on return/ consider adding singulair next ov if still flaring on symb 160  2bid   ? ABPA > needs IgE off pred  ? Active sinus dz > consider sinus ct next  ? Alpha one def > unlikely with perfectly nl spirometry now    Total time devoted to counseling  > 50 % of initial 60 min office visit:  review case with pt/husband Lori Black/ extensive hosp records/  discussion of options/alternatives/ personally creating written customized instructions  in presence of pt  then going over those specific  Instructions directly with the pt including how to use all of the meds but in particular covering each new medication in detail and the difference between the maintenance= "automatic" meds and the prns using an action plan format for the latter (If  this problem/symptom => do that organization reading Left to right).  Please see AVS from this visit for a full list of these instructions which I personally wrote for this pt and  are unique to this visit.

## 2017-04-17 ENCOUNTER — Ambulatory Visit (INDEPENDENT_AMBULATORY_CARE_PROVIDER_SITE_OTHER): Payer: Medicare Other | Admitting: Internal Medicine

## 2017-04-17 ENCOUNTER — Encounter: Payer: Self-pay | Admitting: Internal Medicine

## 2017-04-17 VITALS — BP 110/76 | HR 94 | Ht 66.0 in | Wt 150.0 lb

## 2017-04-17 DIAGNOSIS — J4530 Mild persistent asthma, uncomplicated: Secondary | ICD-10-CM | POA: Insufficient documentation

## 2017-04-17 MED ORDER — BUDESONIDE-FORMOTEROL FUMARATE 160-4.5 MCG/ACT IN AERO: 2.0000 | INHALATION_SPRAY | Freq: Two times a day (BID) | RESPIRATORY_TRACT | 3 refills | Status: DC

## 2017-04-17 NOTE — Patient Instructions (Addendum)
Plan A = Automatic = symbicort 160 Take 2 puffs first thing in am and then another 2 puffs about 12 hours later.    Work on inhaler technique:  relax and gently blow all the way out then take a nice smooth deep breath back in, triggering the inhaler at same time you start breathing in.  Hold for up to 5 seconds if you can. Blow out thru nose. Rinse and gargle with water when done      Plan B = Backup Only use your albuterol as a rescue medication to be used if you can't catch your breath by resting or doing a relaxed purse lip breathing pattern.  - The less you use it, the better it will work when you need it. - Ok to use the inhaler up to 2 puffs  every 4 hours if you must but call for appointment if use goes up over your usual need - Don't leave home without it !!  (think of it like the spare tire for your car)    Please schedule a follow up visit in 3 months but call sooner if needed        

## 2017-04-17 NOTE — Progress Notes (Signed)
Subjective:     Patient ID: Lori Black, female   DOB: 02-11-1986,     MRN: [id number]     Brief patient profile:  30 yowf never smoker asthma since childhood never free of symptoms or need for inhaler and frequent prednisone rx x every few months maint by [facility] doctor on symbicort fall 2017 with no change pattern > referred to pulmonary clinic 03/17/2017 by EDP p 3rd ER trip in 6 months with one admit for asthma        History of Present Illness  03/17/2017 [facility] office visit/ Lori Black   Chief Complaint  Patient presents with  . Pulmonary Consult    Self referral. Pt states that she was dxed with Asthma at birth. She recently moved here from [facility] She has been out of her Symbicort for the past month. She has been having increased SOB and cough with thick, yellow sputum recently.  She has been using albuterol neb and albuterol inhaler at least once per day.   just took last prednisone on day of ov Out of symb x sev weeks   But whne on symb and pred only needs neb once a day  Has assoc nasal congestions and rattling cough worse at hs and in AM Baseline hfa very poor - see hfa  rec  Plan A = Automatic = symbicort 160 Take 2 puffs first thing in am and then another 2 puffs about 12 hours later.  Work on inhaler technique:  relax and gently blow all the way out then take a nice smooth deep breath back in, triggering the inhaler at same time you start breathing in.  Hold for up to 5 seconds if you can. Blow out thru nose. Rinse and gargle with water when done Plan B = Backup Only use your albuterol as a rescue medication       04/17/2017  f/u ov/Lori Black re:  Asthma  maint on symb 160 2bid  Chief Complaint  Patient presents with  . Follow-up    She is no longer coughing. She states her breathing has also improved. No new co's today. She is using her albuterol inhaler and neb with albuterol both 3 x per wk on average    Not limited by breathing from desired activities    No obvious day to  day or daytime variability or assoc excess/ purulent sputum or mucus plugs or hemoptysis or cp or chest tightness, subjective wheeze or overt sinus or hb symptoms. No unusual exp hx or h/o childhood pna/ asthma or knowledge of premature birth.  Sleeping ok without nocturnal  or early am exacerbation  of respiratory  c/o's or need for noct saba. Also denies any obvious fluctuation of symptoms with weather or environmental changes or other aggravating or alleviating factors except as outlined above   Current Medications, Allergies, Complete Past Medical History, Past Surgical History, Family History, and Social History were reviewed in [employer] [facility] record.  ROS  The following are not active complaints unless bolded sore throat, dysphagia, dental problems, itching, sneezing,  nasal congestion or excess/ purulent secretions, ear ache,   fever, chills, sweats, unintended wt loss, classically pleuritic or exertional cp,  orthopnea pnd or leg swelling, presyncope, palpitations, abdominal pain, anorexia, nausea, vomiting, diarrhea  or change in bowel or bladder habits, change in stools or urine, dysuria,hematuria,  rash, arthralgias, visual complaints, headache, numbness, weakness or ataxia or problems with walking or coordination,  change in mood/affect or memory.  Objective:   Physical Exam    amb wf  nad    04/17/2017          150    03/17/17 150 lb (68 kg)  03/07/17 150 lb 4 oz (68.2 kg)  10/09/16 162 lb 1.6 oz (73.5 kg)    Vital signs reviewed  - Note on arrival 02 sats  94% on RA     HEENT: nl dentition,  and oropharynx. Nl external ear canals without cough reflex - moderate bilateral non-specific turbinate edema     NECK :  without JVD/Nodes/TM/ nl carotid upstrokes bilaterally   LUNGS: no acc muscle use,  Nl contour chest with  Clear lungs bilaterally on insp /exp and no cough    CV:  RRR  no s3 or murmur or increase in P2, and no edema    ABD:  soft and nontender with nl inspiratory excursion in the supine position. No bruits or organomegaly appreciated, bowel sounds nl  MS:  Nl gait/ ext warm without deformities, calf tenderness, cyanosis or clubbing No obvious joint restrictions   SKIN: warm and dry without lesions    NEURO:  alert, approp, nl sensorium with  no motor or cerebellar deficits apparent.      I personally reviewed images and agree with radiology impression as follows:  CXR:   03/07/17 No active cardiopulmonary disease.    Assessment:

## 2017-04-17 NOTE — Assessment & Plan Note (Signed)
FENO 03/17/2017  =   30 on final day of prednisone - Spirometry 03/17/2017  wnl s curvature on last day of pred p saba 5 h prior  - 03/17/2017   Start symb 160 2bid   - 04/17/2017  After extensive coaching HFA effectiveness =   90% from a baseline of 75%    All goals of chronic asthma control met including optimal function and elimination of symptoms with minimal need for rescue therapy.  Contingencies discussed in full including contacting this office immediately if not controlling the symptoms using the rule of two's.     I had an extended discussion with the patient reviewing all relevant studies completed to date and  lasting 15 to 20 minutes of a 25 minute visit    Each maintenance medication was reviewed in detail including most importantly the difference between maintenance and prns and under what circumstances the prns are to be triggered using an action plan format that is not reflected in the computer generated alphabetically organized AVS.    Please see AVS for specific instructions unique to this visit that I personally wrote and verbalized to the the pt in detail and then reviewed with pt  by my nurse highlighting any  changes in therapy recommended at today's visit to their plan of care.

## 2017-06-01 ENCOUNTER — Telehealth: Payer: Self-pay | Admitting: Internal Medicine

## 2017-06-01 NOTE — Telephone Encounter (Signed)
Form filled out and signed by MW, placed up front in brown accordion folder for pickup. lmtcb X1 for pt to make aware.

## 2017-06-01 NOTE — Telephone Encounter (Signed)
Forms have been placed in MW's look at for review and signature.  Will route to [facility] to f/u on.

## 2017-06-02 ENCOUNTER — Telehealth: Payer: Self-pay | Admitting: Internal Medicine

## 2017-06-02 MED ORDER — BUDESONIDE-FORMOTEROL FUMARATE 160-4.5 MCG/ACT IN AERO: 2.0000 | INHALATION_SPRAY | Freq: Two times a day (BID) | RESPIRATORY_TRACT | 0 refills | Status: DC

## 2017-06-02 NOTE — Telephone Encounter (Signed)
Pt aware that her forms are ready for pick up.  Samples placed up front as well.  Nothing further needed.

## 2017-06-02 NOTE — Telephone Encounter (Signed)
Pt calling to check on status of paper work she dropped off on 06/01/2017 Ph # [phone]

## 2017-06-02 NOTE — Telephone Encounter (Signed)
duplicate message Nothing further needed.

## 2017-07-18 ENCOUNTER — Ambulatory Visit (INDEPENDENT_AMBULATORY_CARE_PROVIDER_SITE_OTHER): Payer: Medicare Other | Admitting: Internal Medicine

## 2017-07-18 ENCOUNTER — Encounter: Payer: Self-pay | Admitting: Internal Medicine

## 2017-07-18 VITALS — BP 132/80 | HR 93 | Ht 66.0 in | Wt 145.0 lb

## 2017-07-18 DIAGNOSIS — Z23 Encounter for immunization: Secondary | ICD-10-CM | POA: Diagnosis not present

## 2017-07-18 DIAGNOSIS — R0789 Other chest pain: Secondary | ICD-10-CM | POA: Diagnosis not present

## 2017-07-18 DIAGNOSIS — J4530 Mild persistent asthma, uncomplicated: Secondary | ICD-10-CM | POA: Diagnosis not present

## 2017-07-18 MED ORDER — ALBUTEROL SULFATE HFA 108 (90 BASE) MCG/ACT IN AERS: 2.0000 | INHALATION_SPRAY | RESPIRATORY_TRACT | 1 refills | Status: DC | PRN

## 2017-07-18 MED ORDER — ALBUTEROL SULFATE (2.5 MG/3ML) 0.083% IN NEBU: 2.5000 mg | INHALATION_SOLUTION | Freq: Four times a day (QID) | RESPIRATORY_TRACT | 1 refills | Status: DC | PRN

## 2017-07-18 NOTE — Progress Notes (Signed)
Subjective:     Patient ID: Lori Black, female   DOB: 1986-08-04,     MRN: [id number]     Brief patient profile:  30 yowf never smoker asthma since childhood never free of symptoms or need for inhaler and frequent prednisone rx x every few months maint by [facility] doctor on symbicort fall 2017 with no change pattern > referred to pulmonary clinic 03/17/2017 by EDP p 3rd ER trip in 6 months with one admit for asthma        History of Present Illness  03/17/2017 1st [facility] office visit/ Lori Black   Chief Complaint  Patient presents with  . Pulmonary Consult    Self referral. Pt states that she was dxed with Asthma at birth. She recently moved here from [facility]. She has been out of her Symbicort for the past month. She has been having increased SOB and cough with thick, yellow sputum recently.  She has been using albuterol neb and albuterol inhaler at least once per day.   just took last prednisone on day of ov Out of symb x sev weeks   But when on symb and pred only needs neb once a day  Has assoc nasal congestions and rattling cough worse at hs and in AM Baseline hfa very poor - see hfa  rec  Plan A = Automatic = symbicort 160 Take 2 puffs first thing in am and then another 2 puffs about 12 hours later.  Work on inhaler technique:   Plan B = Backup Only use your albuterol as a rescue medication       04/17/2017  f/u ov/Lori Black re:  Asthma  maint on symb 160 2bid  Chief Complaint  Patient presents with  . Follow-up    She is no longer coughing. She states her breathing has also improved. No new co's today. She is using her albuterol inhaler and neb with albuterol both 3 x per wk on average   Not limited by breathing from desired activities   rec Plan A = Automatic = symbicort 160 Take 2 puffs first thing in am and then another 2 puffs about 12 hours later.  Work on inhaler technique: Plan B = Backup Only use your albuterol as a rescue medication     07/18/2017  f/u ov/Lori Black re: chronic  asthma maint on symb 160 2bid with suboptimal hfa  Chief Complaint  Patient presents with  . Follow-up    Pt states her breathing is doing well. She uses the albuterol inhaler and neb with albuterol both once per wk on average. She has had some chest dicomfort upon inspiration for the past 3 days- thinks she pulled a muscle.   1st episode of cp x sev months feel like  A  pulled a muscle in that it is  positional and radiates to shoulder and across midline anteriorly in certain positions but no increase cough / sob assoc with it  saba down to once a week never noct  Not limited by breathing from desired activities    No obvious day to day or daytime variability or assoc excess/ purulent sputum or mucus plugs or hemoptysis   or chest tightness, subjective wheeze or overt sinus or hb symptoms. No unusual exp hx or h/o childhood pna/ asthma or knowledge of premature birth.  Sleeping ok flat without nocturnal  or early am exacerbation  of respiratory  c/o's or need for noct saba. Also denies any obvious fluctuation of symptoms with weather or environmental  changes or other aggravating or alleviating factors except as outlined above   Current Allergies, Complete Past Medical History, Past Surgical History, Family History, and Social History were reviewed in [employer] record.  ROS  The following are not active complaints unless bolded Hoarseness, sore throat, dysphagia, dental problems, itching, sneezing,  nasal congestion or discharge of excess mucus or purulent secretions, ear ache,   fever, chills, sweats, unintended wt loss or wt gain, classically pleuritic or exertional cp,  orthopnea pnd or leg swelling, presyncope, palpitations, abdominal pain, anorexia, nausea, vomiting, diarrhea  or change in bowel habits or change in bladder habits, change in stools or change in urine, dysuria, hematuria,  rash, arthralgias, visual complaints, headache, numbness, weakness or ataxia or  problems with walking or coordination,  change in mood/affect or memory.        Current Meds  Medication Sig  . albuterol (PROVENTIL HFA;VENTOLIN HFA) 108 (90 Base) MCG/ACT inhaler Inhale 2 puffs into the lungs every 4 (four) hours as needed for wheezing or shortness of breath.  Marland Kitchen albuterol (PROVENTIL) (2.5 MG/3ML) 0.083% nebulizer solution Take 3 mLs (2.5 mg total) by nebulization every 6 (six) hours as needed for wheezing or shortness of breath.  . budesonide-formoterol (SYMBICORT) 160-4.5 MCG/ACT inhaler Inhale 2 puffs into the lungs 2 (two) times daily.  . pseudoephedrine-acetaminophen (TYLENOL SINUS) 30-500 MG TABS tablet Take 1 tablet by mouth every 4 (four) hours as needed.  .      .                         Objective:   Physical Exam    amb wf  nad    04/17/2017          150    03/17/17 150 lb (68 kg)  03/07/17 150 lb 4 oz (68.2 kg)  10/09/16 162 lb 1.6 oz (73.5 kg)    Vital signs reviewed  - Note on arrival 02 sats  97% on RA     HEENT: nl dentition,  and oropharynx. Nl external ear canals without cough reflex - moderate bilateral non-specific turbinate edema     NECK :  without JVD/Nodes/TM/ nl carotid upstrokes bilaterally   LUNGS: no acc muscle use,  Nl contour chest with  clear lungs bilaterally on insp /exp and no cough    CV:  RRR  no s3 or murmur or increase in P2, and no edema   ABD:  soft and nontender with nl inspiratory excursion in the supine position. No bruits or organomegaly appreciated, bowel sounds nl  MS:  Nl gait/ ext warm without deformities, calf tenderness, cyanosis or clubbing No obvious joint restrictions   SKIN: warm and dry without lesions    NEURO:  alert, approp, nl sensorium with  no motor or cerebellar deficits apparent.      I personally reviewed images and agree with radiology impression as follows:  CXR:   03/07/17 No active cardiopulmonary disease.    Assessment:

## 2017-07-18 NOTE — Patient Instructions (Signed)
For sinus pressure /congestion > advil cold and sinus  Work on inhaler technique:  relax and gently blow all the way out then take a nice smooth deep breath back in, triggering the inhaler at same time you start breathing in.  Hold for up to 5 seconds if you can. Blow out thru nose. Rinse and gargle with water when done      Please schedule a follow up office visit in 6 months  call sooner if needed

## 2017-07-19 DIAGNOSIS — R0789 Other chest pain: Secondary | ICD-10-CM | POA: Insufficient documentation

## 2017-07-19 NOTE — Assessment & Plan Note (Signed)
FENO 03/17/2017  =   30 on final day of prednisone - Spirometry 03/17/2017  wnl s curvature on last day of pred p saba 5 h prior  - 03/17/2017   Start symb 160 2bid - 07/18/2017   After extensive coaching HFA effectiveness =   90% from a baseline of 75%  Despite suboptimal hfa All goals of chronic asthma control met including optimal function and elimination of symptoms with minimal need for rescue therapy.  Contingencies discussed in full including contacting this office immediately if not controlling the symptoms using the rule of two's.     Each maintenance medication was reviewed in detail including most importantly the difference between maintenance and as needed and under what circumstances the prns are to be used.  Please see AVS for specific  Instructions which are unique to this visit and I personally typed out  which were reviewed in detail in writing with the patient and a copy provided.

## 2017-07-19 NOTE — Assessment & Plan Note (Signed)
Onset summer 2018 / L anterior / shoulder with nl exam 07/18/2017   Pain is recurrent in exact same distribution where previously felt she pulled a muscle with a benign exam so spent time reviewing how to tell different patterns of chest pain are non-internal vs pulmonary/ cardiac source and rec tylenol/ heat and Follow up per Primary Care planned  / ortho prn

## 2017-08-24 IMAGING — CR DG CHEST 2V
2 series · 2 of 2 positions shown · non-contrast
Comparison: 08/01/2016.

CLINICAL DATA: Shortness of breath. Dry cough. Upper chest pain
with coughing.

EXAM:
CHEST  2 VIEW

[w chest pa]
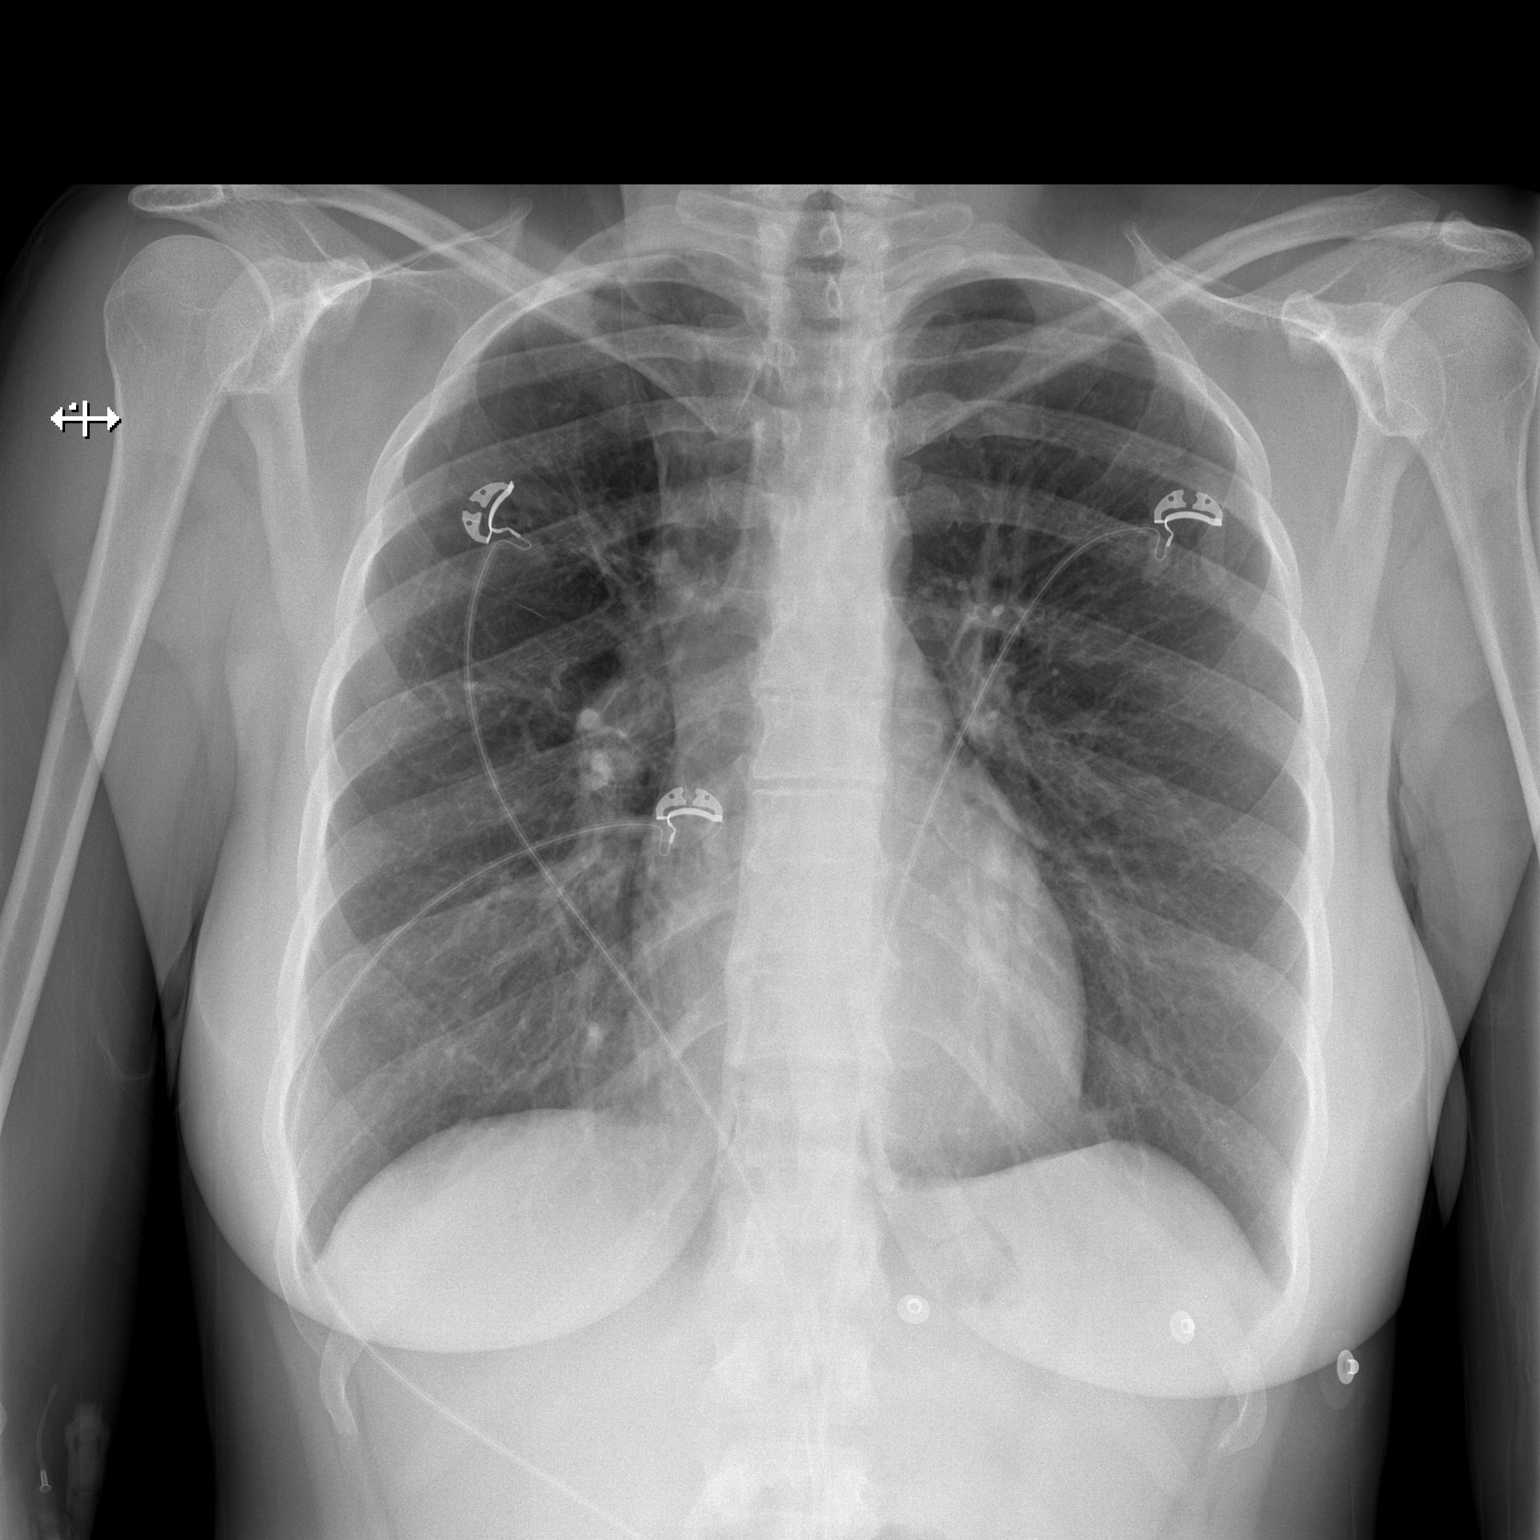

[w chest lat]
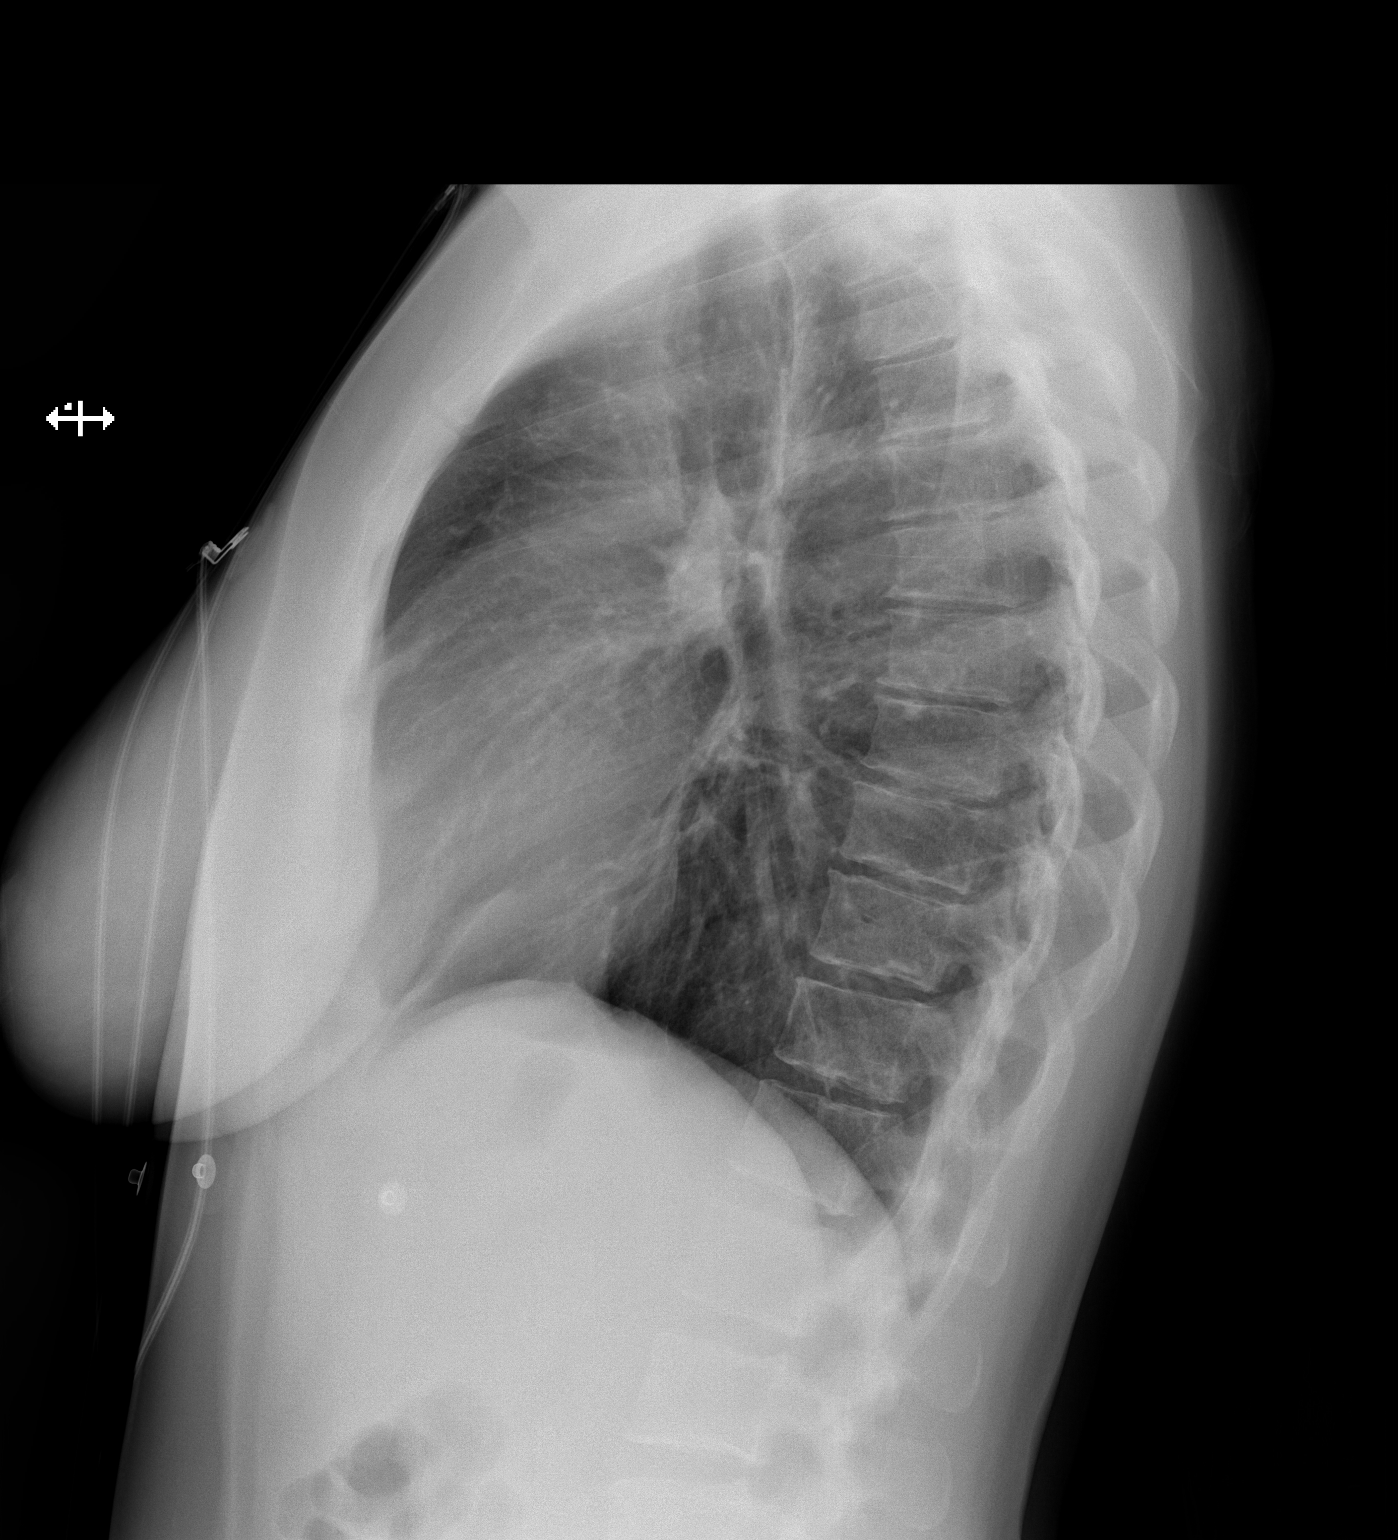

[2 of 2 positions shown; findings below may reference images not displayed]

FINDINGS: Normal sized heart. Clear lungs with normal vascularity. Minimal
thoracic spine degenerative changes.
IMPRESSION: No acute abnormality.

## 2017-10-20 IMAGING — CR DG CHEST 2V
2 series · 2 of 2 positions shown · non-contrast
Comparison: 08/05/2016

CLINICAL DATA: Cough, congestion, shortness of breath, body aches
and fever 2 days.

EXAM:
CHEST  2 VIEW

[w chest pa]
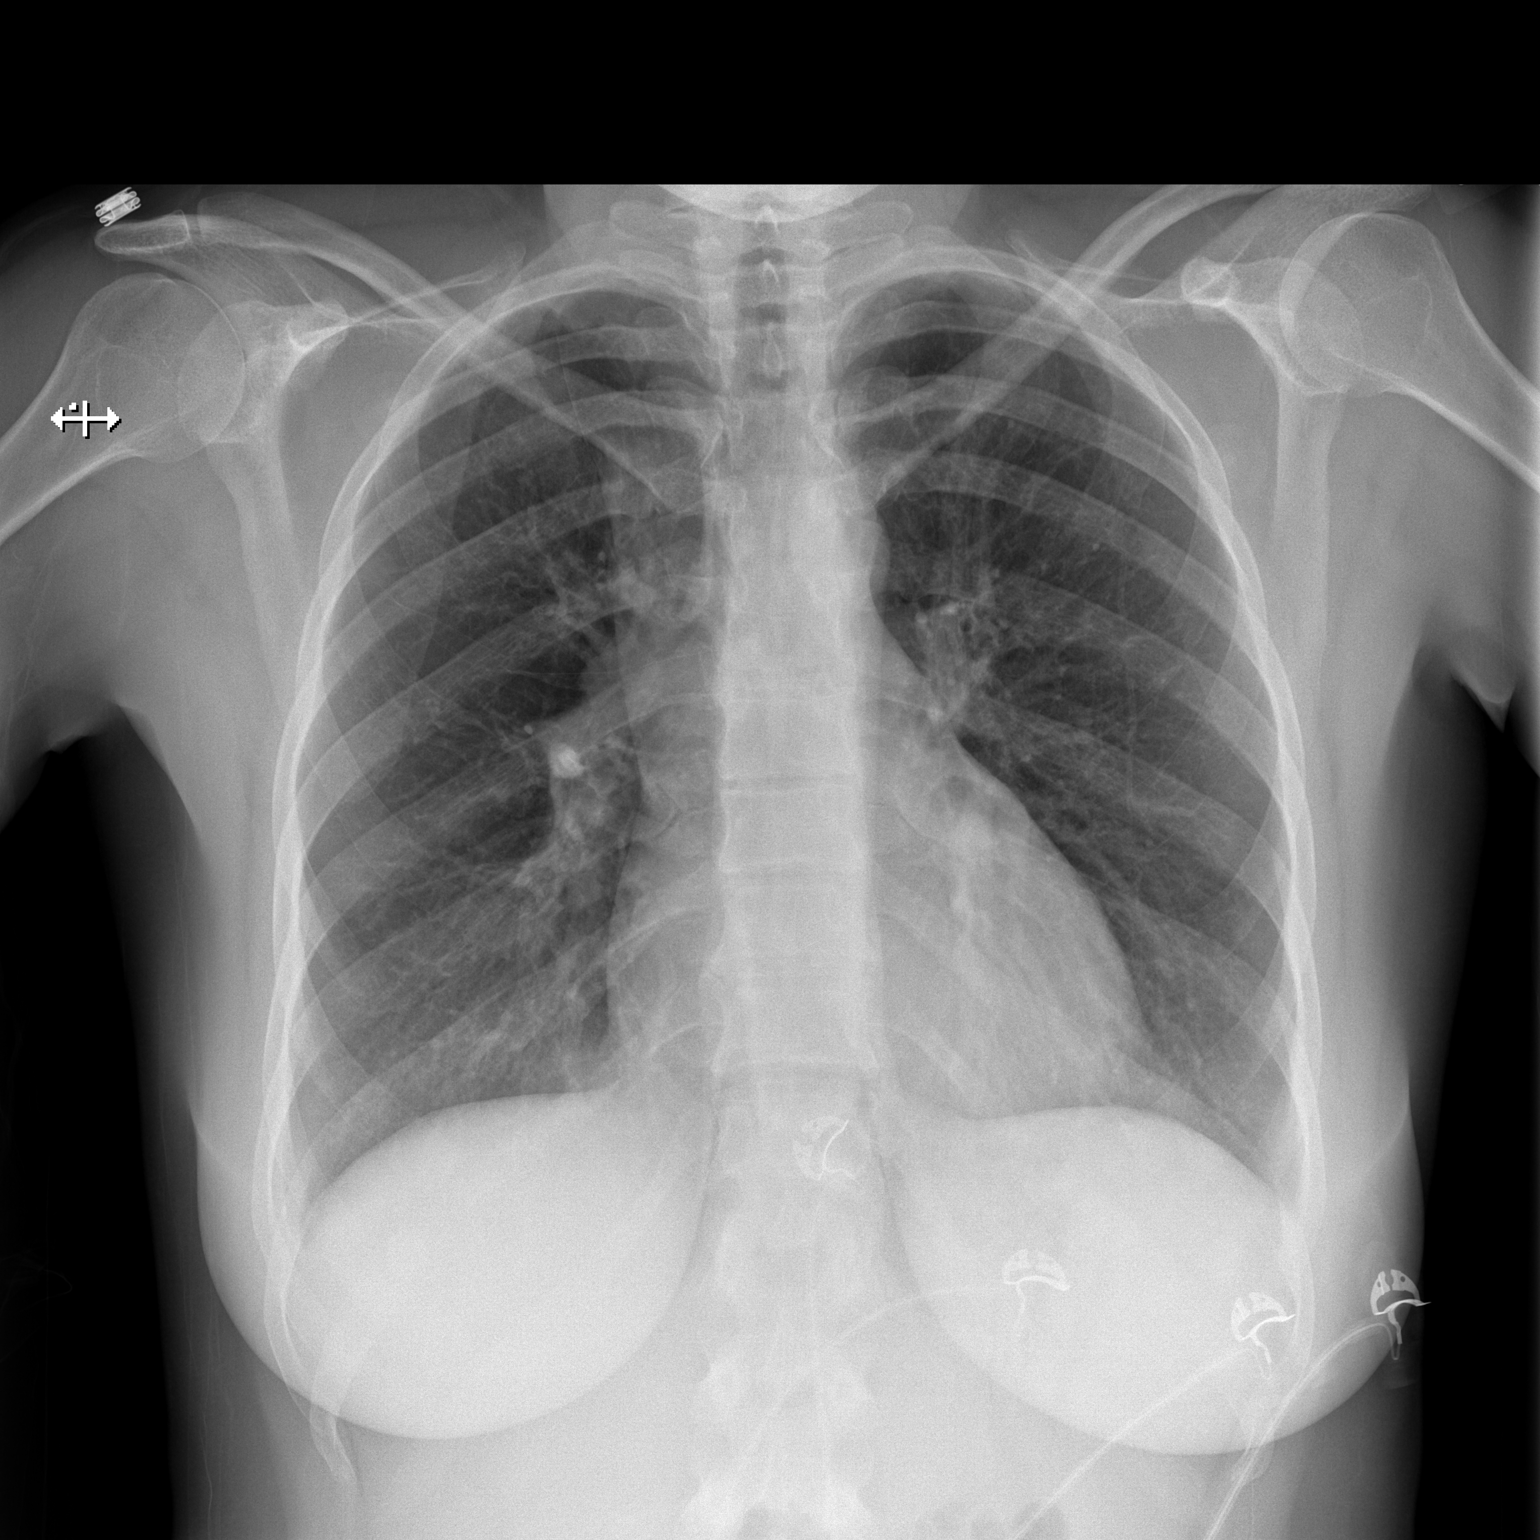

[w chest lat]
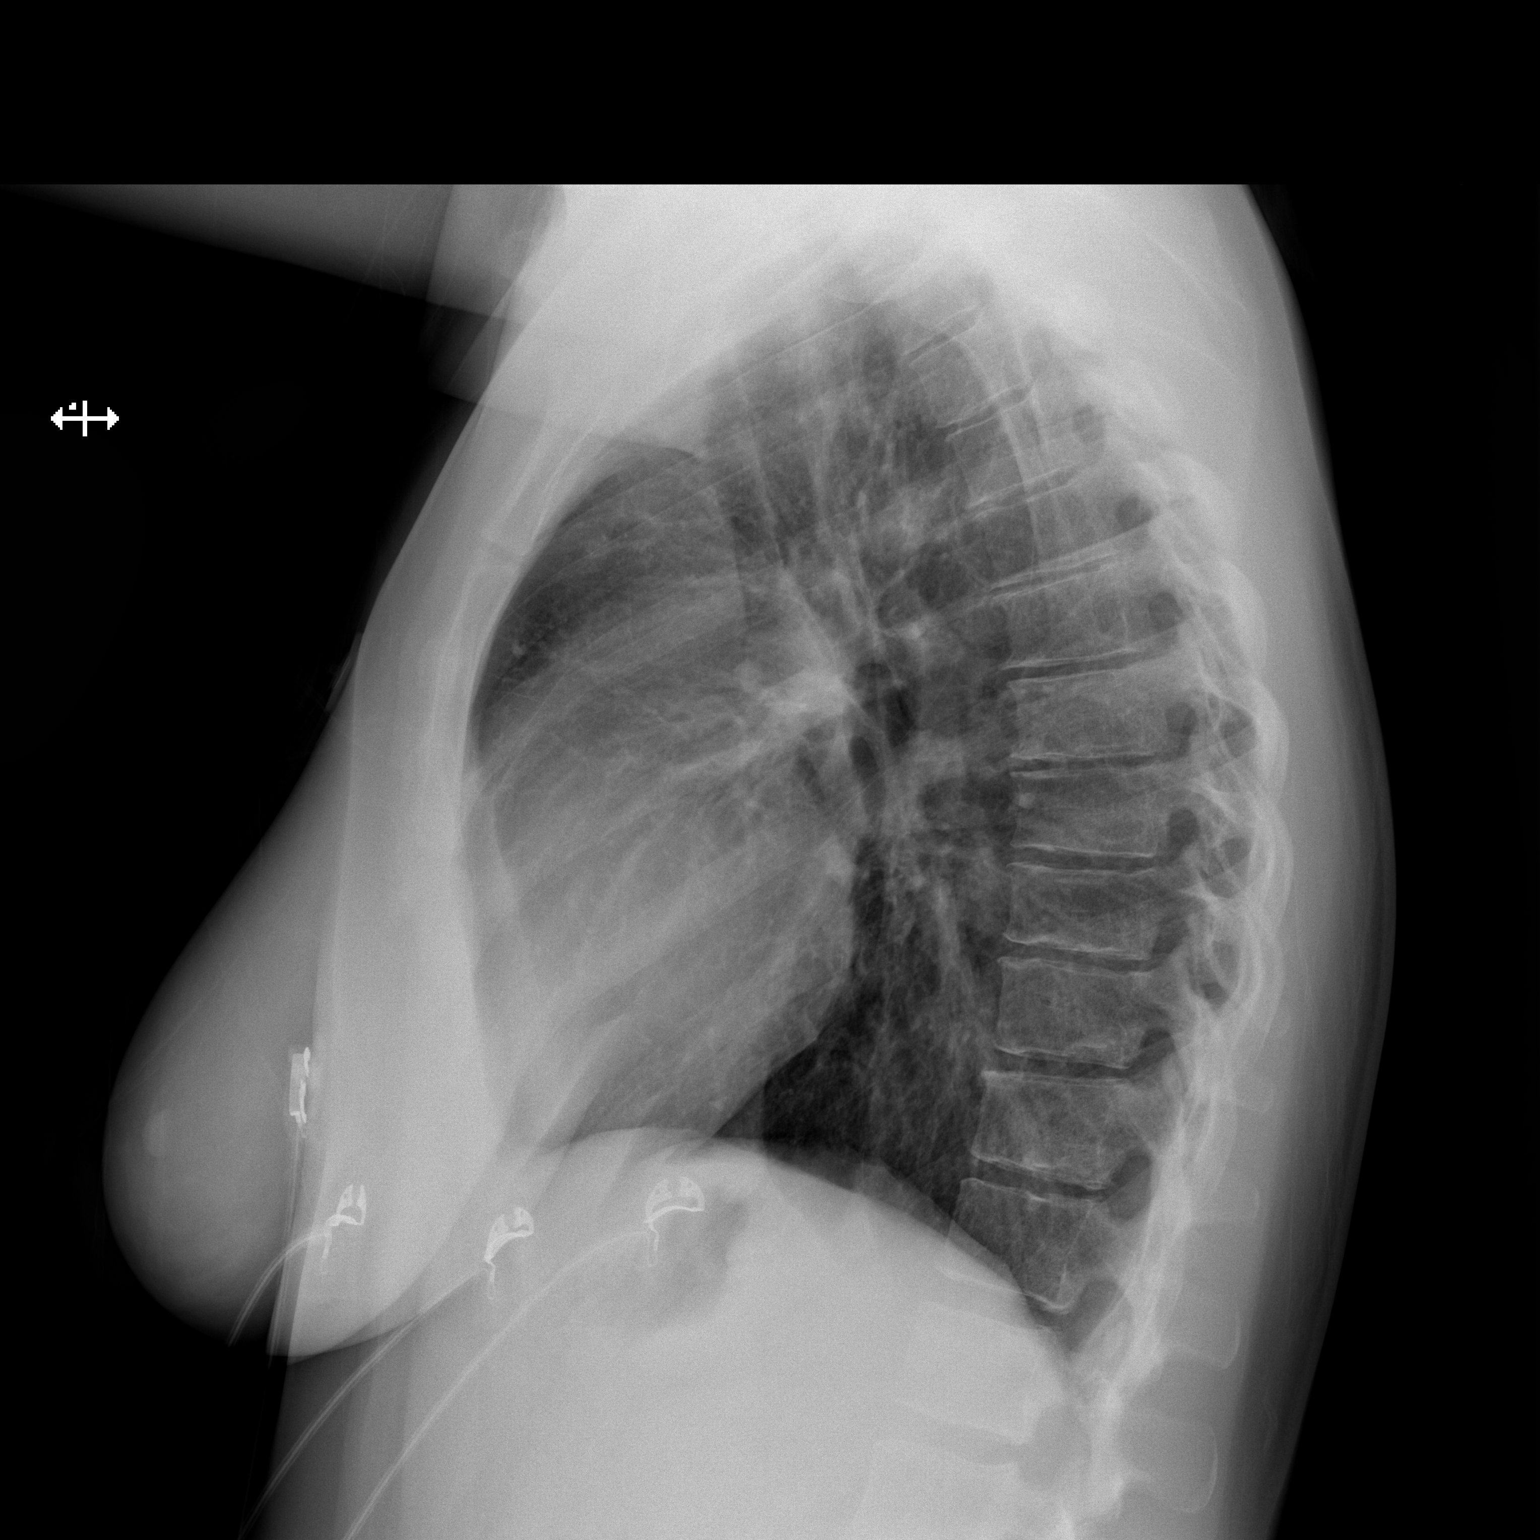

[2 of 2 positions shown; findings below may reference images not displayed]

FINDINGS: Lungs are clear. Cardiomediastinal silhouette, bones and soft
tissues are normal.
IMPRESSION: No active cardiopulmonary disease.

## 2017-12-25 ENCOUNTER — Telehealth: Payer: Self-pay | Admitting: Internal Medicine

## 2017-12-25 MED ORDER — ALBUTEROL SULFATE (2.5 MG/3ML) 0.083% IN NEBU: 2.5000 mg | INHALATION_SOLUTION | Freq: Four times a day (QID) | RESPIRATORY_TRACT | 1 refills | Status: DC | PRN

## 2017-12-25 NOTE — Telephone Encounter (Signed)
Called and spoke with patient, patient was needing refill on albuterol nebulizer solution. Refill sent to patients pharmacy.   Patient also wanted her pharmacy switched. This has been done. Nothing further needed.

## 2018-01-16 ENCOUNTER — Ambulatory Visit (INDEPENDENT_AMBULATORY_CARE_PROVIDER_SITE_OTHER): Payer: Medicare Other | Admitting: Internal Medicine

## 2018-01-16 ENCOUNTER — Encounter: Payer: Self-pay | Admitting: Internal Medicine

## 2018-01-16 VITALS — BP 118/78 | HR 69 | Ht 66.0 in | Wt 150.8 lb

## 2018-01-16 DIAGNOSIS — J4530 Mild persistent asthma, uncomplicated: Secondary | ICD-10-CM

## 2018-01-16 LAB — NITRIC OXIDE: Nitric Oxide: 64

## 2018-01-16 MED ORDER — MONTELUKAST SODIUM 10 MG PO TABS: 10.0000 mg | ORAL_TABLET | Freq: Every day | ORAL | 11 refills | Status: DC

## 2018-01-16 MED ORDER — BUDESONIDE-FORMOTEROL FUMARATE 160-4.5 MCG/ACT IN AERO: 2.0000 | INHALATION_SPRAY | Freq: Two times a day (BID) | RESPIRATORY_TRACT | 3 refills | Status: DC

## 2018-01-16 MED ORDER — BUDESONIDE-FORMOTEROL FUMARATE 160-4.5 MCG/ACT IN AERO: 2.0000 | INHALATION_SPRAY | Freq: Two times a day (BID) | RESPIRATORY_TRACT | 0 refills | Status: DC

## 2018-01-16 MED ORDER — PREDNISONE 10 MG PO TABS: ORAL_TABLET | ORAL | 0 refills | Status: DC

## 2018-01-16 NOTE — Progress Notes (Signed)
Subjective:     Patient ID: Lori Black, female   DOB: 1985-11-17,     MRN: [id number]     Brief patient profile:  30 yowf never smoker asthma since childhood never free of symptoms or need for inhaler and frequent prednisone rx x every few months maint by [facility] doctor on symbicort fall 2017 with no change pattern > referred to pulmonary clinic 03/17/2017 by EDP p 3rd ER trip in 6 months with one admit for asthma        History of Present Illness  03/17/2017 1st [facility] office visit/ Tahjay Binion   Chief Complaint  Patient presents with  . Pulmonary Consult    Self referral. Pt states that she was dxed with Asthma at birth. She recently moved here from [place]. She has been out of her Symbicort for the past month. She has been having increased SOB and cough with thick, yellow sputum recently.  She has been using albuterol neb and albuterol inhaler at least once per day.   just took last prednisone on day of ov Out of symb x sev weeks   But when on symb and pred only needs neb once a day  Has assoc nasal congestions and rattling cough worse at hs and in AM Baseline hfa very poor - see hfa  rec  Plan A = Automatic = symbicort 160 Take 2 puffs first thing in am and then another 2 puffs about 12 hours later.  Work on inhaler technique:   Plan B = Backup Only use your albuterol as a rescue medication       04/17/2017  f/u ov/Kevan Prouty re:  Asthma  maint on symb 160 2bid  Chief Complaint  Patient presents with  . Follow-up    She is no longer coughing. She states her breathing has also improved. No new co's today. She is using her albuterol inhaler and neb with albuterol both 3 x per wk on average   Not limited by breathing from desired activities   rec Plan A = Automatic = symbicort 160 Take 2 puffs first thing in am and then another 2 puffs about 12 hours later.  Work on inhaler technique: Plan B = Backup Only use your albuterol as a rescue medication     07/18/2017  f/u ov/Treanna Dumler re: chronic  asthma maint on symb 160 2bid with suboptimal hfa  Chief Complaint  Patient presents with  . Follow-up    Pt states her breathing is doing well. She uses the albuterol inhaler and neb with albuterol both once per wk on average. She has had some chest dicomfort upon inspiration for the past 3 days- thinks she pulled a muscle.   1st episode of cp x sev months feel like  A  pulled a muscle in that it is  positional and radiates to shoulder and across midline anteriorly in certain positions but no increase cough / sob assoc with it  saba down to once a week never noct  Not limited by breathing from desired activities   rec For sinus pressure /congestion > advil cold and sinus Work on inhaler technique:     01/16/2018  f/u ov/Damiah Mcdonald re: chronic asthma since childhood/ on disability due to asthma  maint on symb 160 Chief Complaint  Patient presents with  . Follow-up    f/u asthma, allergies, sinus problems causing breathing issues, using nebulizers more  baseline Dyspnea:  Not limited by breathing from desired activities   Cough: worse with pollen Sleep:  30 degrees, occ neb noct. Lots of noct nasal congestion SABA use:  Prior to 2-3 weeks prior to OV  Was not needing any saba now even needing neb    No obvious patterns in day to day or daytime variability or assoc excess/ purulent sputum or mucus plugs or hemoptysis or cp or chest tightness, subjective wheeze or overt   hb symptoms. No unusual exposure hx or h/o childhood pna/ asthma or knowledge of premature birth.  Sleeping ok flat without nocturnal  or early am exacerbation  of respiratory  c/o's or need for noct saba. Also denies any obvious fluctuation of symptoms with weather or environmental changes or other aggravating or alleviating factors except as outlined above   Current Allergies, Complete Past Medical History, Past Surgical History, Family History, and Social History [facility].  ROS   The following are not active complaints unless bolded Hoarseness, sore throat, dysphagia, dental problems, itching, sneezing,  nasal congestion or discharge of excess mucus or purulent secretions, ear ache,   fever, chills, sweats, unintended wt loss or wt gain, classically pleuritic or exertional cp,  orthopnea pnd or leg swelling, presyncope, palpitations, abdominal pain, anorexia, nausea, vomiting, diarrhea  or change in bowel habits or change in bladder habits, change in stools or change in urine, dysuria, hematuria,  rash, arthralgias, visual complaints, headache, numbness, weakness or ataxia or problems with walking or coordination,  change in mood/affect or memory.        Current Meds  Medication Sig  . albuterol (PROVENTIL HFA;VENTOLIN HFA) 108 (90 Base) MCG/ACT inhaler Inhale 2 puffs into the lungs every 4 (four) hours as needed for wheezing or shortness of breath.  Marland Kitchen. albuterol (PROVENTIL) (2.5 MG/3ML) 0.083% nebulizer solution Take 3 mLs (2.5 mg total) by nebulization every 6 (six) hours as needed for wheezing or shortness of breath.  . budesonide-formoterol (SYMBICORT) 160-4.5 MCG/ACT inhaler Inhale 2 puffs into the lungs 2 (two) times daily.  . pseudoephedrine-acetaminophen (TYLENOL SINUS) 30-500 MG TABS tablet Take 1 tablet by mouth every 4 (four) hours as needed.  .     .                           Objective:   Physical Exam   amb wf nasal tone to voice    01/16/2018          150  04/17/2017          150    03/17/17 150 lb (68 kg)  03/07/17 150 lb 4 oz (68.2 kg)  10/09/16 162 lb 1.6 oz (73.5 kg)    Vital signs reviewed - Note on arrival 02 sats  99% on RA      HEENT: nl dentition,  and oropharynx. Nl external ear canals without cough reflex - moderate bilateral non-specific turbinate edema     NECK :  without JVD/Nodes/TM/ nl carotid upstrokes bilaterally   LUNGS: no acc muscle use,  Nl contour chest which is clear to A and P bilaterally without cough on insp  or exp maneuvers   CV:  RRR  no s3 or murmur or increase in P2, and no edema   ABD:  soft and nontender with nl inspiratory excursion in the supine position. No bruits or organomegaly appreciated, bowel sounds nl  MS:  Nl gait/ ext warm without deformities, calf tenderness, cyanosis or clubbing No obvious joint restrictions   SKIN: warm and dry without  lesions    NEURO:  alert, approp, nl sensorium with  no motor or cerebellar deficits apparent.         Labs ordered 01/16/2018  Allergy profile > did not go to lab        Assessment:

## 2018-01-16 NOTE — Patient Instructions (Addendum)
Please remember to go to the lab department downstairs in the basement  for your tests - we will call you with the results when they are available.     Singulair(montelukast)  10 mg one daily   If not better prednisone x 6 days:   Prednisone 10 mg take  4 each am x 2 days,   2 each am x 2 days,  1 each am x 2 days and stop   Plan A = Automatic = symbicort 160 Take 2 puffs first thing in am and then another 2 puffs about 12 hours later.    Work on Mining engineerperfecting your inhaler technique:  relax and gently blow all the way out then take a nice smooth deep breath back in, triggering the inhaler at same time you start breathing in.  Hold for up to 5 seconds if you can. Blow out thru nose. Rinse and gargle with water when done      Plan B = Backup Only use your albuterol as a rescue medication to be used if you can't catch your breath by resting or doing a relaxed purse lip breathing pattern.  - The less you use it, the better it will work when you need it. - Ok to use the inhaler up to 2 puffs  every 4 hours if you must but call for appointment if use goes up over your usual need - Don't leave home without it !!  (think of it like the spare tire for your car)   Plan C = Crisis - only use your albuterol nebulizer if you first try Plan B and it fails to help > ok to use the nebulizer up to every 4 hours but if start needing it regularly call for immediate appointment   Please schedule a follow up visit in 3 months but call sooner if needed

## 2018-01-17 ENCOUNTER — Encounter: Payer: Self-pay | Admitting: Internal Medicine

## 2018-01-17 NOTE — Assessment & Plan Note (Addendum)
FENO 03/17/2017  =   30 on final day of prednisone - Spirometry 03/17/2017  wnl s curvature on last day of pred p saba 5 h prior  - 03/17/2017   Start symb 160 2bid  - 01/16/2018  After extensive coaching inhaler device  effectiveness =    75% (short Ti)   - 01/16/2018 added singulair - FENO 01/16/2018  =   62 on symb 160 2bid  - Spirometry 01/16/2018 wnl x for Min curvature  - Allergy profile 01/16/2018 >  Eos 0. /  IgE  Did not go to lab    Clearly breaking the rule of two's In this case Adherence is the biggest issue and starts with  inability to use HFA effectively and also  understand that SABA treats the symptoms but doesn't get to the underlying problem (inflammation).  I used  the analogy of putting steroid cream on a rash to help explain the meaning of topical therapy and the need to get the drug to the target tissue.    rec add singulair/ continue symbicort 160 with optimal technique/ try to reduce saba back to baseline and if not take 6 days of prednisone and consider sinus ct/ allergy w/u if not improving    I had an extended discussion with the patient reviewing all relevant studies completed to date and  lasting 15 to 20 minutes of a 25 minute visit    Each maintenance medication was reviewed in detail including most importantly the difference between maintenance and prns and under what circumstances the prns are to be triggered using an action plan format that is not reflected in the computer generated alphabetically organized AVS.    Please see AVS for specific instructions unique to this visit that I personally wrote and verbalized to the the pt in detail and then reviewed with pt  by my nurse highlighting any  changes in therapy recommended at today's visit to their plan of care.

## 2018-04-17 ENCOUNTER — Encounter: Payer: Self-pay | Admitting: Internal Medicine

## 2018-04-17 ENCOUNTER — Ambulatory Visit (INDEPENDENT_AMBULATORY_CARE_PROVIDER_SITE_OTHER): Payer: Medicare Other | Admitting: Internal Medicine

## 2018-04-17 ENCOUNTER — Other Ambulatory Visit (INDEPENDENT_AMBULATORY_CARE_PROVIDER_SITE_OTHER): Payer: Medicare Other

## 2018-04-17 VITALS — BP 118/76 | HR 96 | Ht 66.0 in | Wt 145.0 lb

## 2018-04-17 DIAGNOSIS — J4530 Mild persistent asthma, uncomplicated: Secondary | ICD-10-CM | POA: Diagnosis not present

## 2018-04-17 LAB — CBC WITH DIFFERENTIAL/PLATELET
Basophils Absolute: 0.1 10*3/uL (ref 0.0–0.1)
Basophils Relative: 0.8 % (ref 0.0–3.0)
EOS PCT: 3.1 % (ref 0.0–5.0)
Eosinophils Absolute: 0.2 10*3/uL (ref 0.0–0.7)
HCT: 30.4 % — ABNORMAL LOW (ref 36.0–46.0)
Hemoglobin: 9.7 g/dL — ABNORMAL LOW (ref 12.0–15.0)
LYMPHS ABS: 1.4 10*3/uL (ref 0.7–4.0)
Lymphocytes Relative: 21 % (ref 12.0–46.0)
MCHC: 31.9 g/dL (ref 30.0–36.0)
MCV: 70 fl — ABNORMAL LOW (ref 78.0–100.0)
MONOS PCT: 6 % (ref 3.0–12.0)
Monocytes Absolute: 0.4 10*3/uL (ref 0.1–1.0)
NEUTROS ABS: 4.5 10*3/uL (ref 1.4–7.7)
NEUTROS PCT: 69.1 % (ref 43.0–77.0)
PLATELETS: 236 10*3/uL (ref 150.0–400.0)
RBC: 4.34 Mil/uL (ref 3.87–5.11)
RDW: 16.1 % — AB (ref 11.5–15.5)
WBC: 6.5 10*3/uL (ref 4.0–10.5)

## 2018-04-17 LAB — NITRIC OXIDE: Nitric Oxide: 30

## 2018-04-17 MED ORDER — BUDESONIDE-FORMOTEROL FUMARATE 160-4.5 MCG/ACT IN AERO: 2.0000 | INHALATION_SPRAY | Freq: Two times a day (BID) | RESPIRATORY_TRACT | 0 refills | Status: DC

## 2018-04-17 NOTE — Patient Instructions (Addendum)
Work on Mining engineerperfecting your inhaler technique:  relax and gently blow all the way out then take a nice smooth deep breath back in, triggering the inhaler at same time you start breathing in.  Hold for up to 5 seconds if you can. Blow out thru nose. Rinse and gargle with water when done    [facility] to take prednisone for your nasal symptoms  Zyrtec is the best over the counter for nasal congestion as needed   Please remember to go to the lab department downstairs in the basement  for your tests - we will call you with the results when they are available.   Please schedule a follow up visit in 3 months but call sooner if needed  - consider sinus ct now

## 2018-04-17 NOTE — Progress Notes (Signed)
Subjective:     Patient ID: Lori Black, female   DOB: 09/25/1986    MRN: [id number]  Brief patient profile:  31 yowf never smoker asthma since childhood never free of symptoms or need for inhaler and frequent prednisone rx x every few months maint by [facility] doctor on symbicort fall 2017 with no change pattern > referred to pulmonary clinic 03/17/2017 by EDP p 3rd ER trip in 6 months with one admit for asthma        History of Present Illness  03/17/2017 1st [facility] office visit/ Lori Black   Chief Complaint  Patient presents with  . Pulmonary Consult    Self referral. Pt states that she was dxed with Asthma at birth. She recently moved here from [facility]. She has been out of her Symbicort for the past month. She has been having increased SOB and cough with thick, yellow sputum recently.  She has been using albuterol neb and albuterol inhaler at least once per day.   just took last prednisone on day of ov Out of symb x sev weeks   But when on symb and pred only needs neb once a day  Has assoc nasal congestions and rattling cough worse at hs and in AM Baseline hfa very poor - see hfa  rec  Plan A = Automatic = symbicort 160 Take 2 puffs first thing in am and then another 2 puffs about 12 hours later.  Work on inhaler technique:   Plan B = Backup Only use your albuterol as a rescue medication             01/16/2018  f/u ov/[name] re: chronic asthma since childhood/ on disability due to asthma  maint on symb 160 Chief Complaint  Patient presents with  . Follow-up    f/u asthma, allergies, sinus problems causing breathing issues, using nebulizers more  baseline Dyspnea:  Not limited by breathing from desired activities   Cough: worse with pollen Sleep: 30 degrees, occ neb noct. Lots of noct nasal congestion SABA use:  Prior to 2-3 weeks prior to OV  Was not needing any saba now even needing neb rec Please remember to go to the lab department downstairs in the basement  for your tests - we  will call you with the results when they are available. Singulair(montelukast)  10 mg one daily  If not better prednisone x 6 days:   Prednisone 10 mg take  4 each am x 2 days,   2 each am x 2 days,  1 each am x 2 days and stop  Plan A = Automatic = symbicort 160 Take 2 puffs first thing in am and then another 2 puffs about 12 hours later.  Work on Set designer:   Plan B = Backup Only use your albuterol as a rescue medication Plan C = Crisis - only use your albuterol nebulizer if you first try Plan B and it fails to help > ok to use the nebulizer up to every 4 hours but if start needing it regularly call for immediate appointment     04/17/2018  f/u ov/Lori Black re: chronic mild asthma and rhintis with tendence to freq exac Chief Complaint  Patient presents with  . Follow-up    Breathing has improved some. She is c/o nasal congestion and "sinus stuff".  She is using her albuterol inhaler 2 x per wk and neb with albuterol once per wk on average.   Dyspnea:  MMRC1 = can walk  nl pace, flat grade, can't hurry or go uphills or steps s sob   Cough: daytime attributes to pnds  SABA use: as above, way too much  02: none   No obvious day to day or daytime variability or assoc excess/ purulent sputum or mucus plugs or hemoptysis or cp or chest tightness, subjective wheeze or overt sinus or hb symptoms.   Sleeping: 1 pillow, rarely wakens with resp symptoms without nocturnal  or early am exacerbation  of respiratory  c/o's or need for noct saba. Also denies any obvious fluctuation of symptoms with weather or environmental changes or other aggravating or alleviating factors except as outlined above   No unusual exposure hx or h/o childhood pna/ asthma or knowledge of premature birth.  Current Allergies, Complete Past Medical History, Past Surgical History, Family History, and Social History were reviewed in [facility] electronic medical record.  ROS  The following are not  active complaints unless bolded Hoarseness, sore throat, dysphagia, dental problems, itching, sneezing,  nasal congestion or discharge of excess mucus or purulent secretions, ear ache,   fever, chills, sweats, unintended wt loss or wt gain, classically pleuritic or exertional cp,  orthopnea pnd or arm/hand swelling  or leg swelling, presyncope, palpitations, abdominal pain, anorexia, nausea, vomiting, diarrhea  or change in bowel habits or change in bladder habits, change in stools or change in urine, dysuria, hematuria,  rash, arthralgias, visual complaints, headache, numbness, weakness or ataxia or problems with walking or coordination,  change in mood or  memory.        Current Meds  Medication Sig  . albuterol (PROVENTIL HFA;VENTOLIN HFA) 108 (90 Base) MCG/ACT inhaler Inhale 2 puffs into the lungs every 4 (four) hours as needed for wheezing or shortness of breath.  Marland Kitchen. albuterol (PROVENTIL) (2.5 MG/3ML) 0.083% nebulizer solution Take 3 mLs (2.5 mg total) by nebulization every 6 (six) hours as needed for wheezing or shortness of breath.  . budesonide-formoterol (SYMBICORT) 160-4.5 MCG/ACT inhaler Inhale 2 puffs into the lungs 2 (two) times daily.  . montelukast (SINGULAIR) 10 MG tablet Take 1 tablet (10 mg total) by mouth at bedtime.  . pseudoephedrine-acetaminophen (TYLENOL SINUS) 30-500 MG TABS tablet Take 1 tablet by mouth every 4 (four) hours as needed.                         Objective:   Physical Exam   amb wf nasal tone to voice   04/17/2018          145  01/16/2018          150  04/17/2017          150    03/17/17 150 lb (68 kg)  03/07/17 150 lb 4 oz (68.2 kg)  10/09/16 162 lb 1.6 oz (73.5 kg)      Vital signs reviewed - Note on arrival 02 sats  99% on RA        HEENT: nl dentition,  and oropharynx. Nl external ear canals without cough reflex- moderate bilateral non-specific turbinate edema     NECK :  without JVD/Nodes/TM/ nl carotid upstrokes bilaterally   LUNGS:  no acc muscle use,  Nl contour chest which is clear to A and P bilaterally without cough on insp or exp maneuvers   CV:  RRR  no s3 or murmur or increase in P2, and no edema   ABD:  soft and nontender with nl inspiratory excursion in the supine position. No  bruits or organomegaly appreciated, bowel sounds nl  MS:  Nl gait/ ext warm without deformities, calf tenderness, cyanosis or clubbing No obvious joint restrictions   SKIN: warm and dry without lesions    NEURO:  alert, approp, nl sensorium with  no motor or cerebellar deficits apparent.         Labs ordered 04/17/2018   Allergy profile         Assessment:

## 2018-04-18 ENCOUNTER — Encounter: Payer: Self-pay | Admitting: Internal Medicine

## 2018-04-18 LAB — RESPIRATORY ALLERGY PROFILE REGION II ~~LOC~~
ALLERGEN, D PTERNOYSSINUS, D1: 12.5 kU/L — AB
ALLERGEN, MOUSE U PROTEIN, E72: 0.8 kU/L — AB
ALLERGEN, OAK, T7: 4.88 kU/L — AB
Allergen, A. alternata, m6: <0.1 kU/L
Allergen, Comm Silver Birch, t9: <0.1 kU/L
Allergen, Cottonwood, t14: <0.1 kU/L
Bermuda Grass: <0.1 kU/L
Box Elder IgE: <0.1 kU/L
CAT DANDER: 38.4 kU/L — AB
CLADOSPORIUM HERBARUM (M2) IGE: <0.1 kU/L
CLASS: 0
CLASS: 0
CLASS: 0
CLASS: 0
CLASS: 0
CLASS: 3
CLASS: 3
COMMON RAGWEED (SHORT) (W1) IGE: <0.1 kU/L
Class: 0
Class: 0
Class: 0
Class: 0
Class: 0
Class: 0
Class: 0
Class: 0
Class: 0
Class: 0
Class: 0
Class: 0
Class: 0
Class: 2
Class: 2
Class: 3
Class: 4
D. FARINAE: 12.1 kU/L — AB
Dog Dander: 2.55 kU/L — ABNORMAL HIGH
Elm IgE: <0.1 kU/L
IgE (Immunoglobulin E), Serum: 158 kU/L — ABNORMAL HIGH (ref <=114)
Pecan/Hickory Tree IgE: <0.1 kU/L
Sheep Sorrel IgE: <0.1 kU/L

## 2018-04-18 LAB — INTERPRETATION:

## 2018-04-18 NOTE — Assessment & Plan Note (Signed)
FENO 03/17/2017  =   30 on final day of prednisone - Spirometry 03/17/2017  wnl s curvature on last day of pred p saba 5 h prior  - 03/17/2017   Start symb 160 2bid - 01/16/2018  After extensive coaching inhaler device  effectiveness =    75% (short Ti)   - FENO 01/16/2018  =   62 on symb 160 2bid  - Spirometry 01/16/2018  wnl with  Min curvature  - 01/16/2018 added singulair  - Allergy profile 04/17/2018 >  Eos 0.2 /  IgE   - 04/17/2018  After extensive coaching inhaler device  effectiveness =    75% (short Ti)  - FENO 04/17/2018  =  30 p  symb 160/ singulair    Continues to overuse Saba indicating ifficult airways management ddx: almost all start with A and  include Adherence, Ace Inhibitors, Acid Reflux, Active Sinus Disease, Alpha 1 Antitripsin deficiency, Anxiety masquerading as Airways dz,  ABPA,  Allergy(esp in young), Aspiration (esp in elderly), Adverse effects of meds,  Active smokers, A bunch of PE's (a small clot burden can't cause this syndrome unless there is already severe underlying pulm or vascular dz with poor reserve) plus two Bs  = Bronchiectasis and Beta blocker use..and one C= CHF    In this case Adherence is the biggest issue and starts with  inability to use HFA effectively and also  understand that SABA treats the symptoms but doesn't get to the underlying problem (inflammation).  I used  the analogy of putting steroid cream on a rash to help explain the meaning of topical therapy and the need to get the drug to the target tissue.   - see hfa teaching - return with all meds in hand using a trust but verify approach to confirm accurate Medication  Reconciliation The principal here is that until we are certain that the  patients are doing what we've asked, it makes no sense to ask them to do more.   ? Allergies > send profile/ Prednisone 10 mg take  4 each am x 2 days,   2 each am x 2 days,  1 each am x 2 days and stop   ? Active sinus dz/ polyposis?  > consider sinus CT/ try zyrtec and  continue singulair  ? ABPA > check IgE    I had an extended discussion with the patient reviewing all relevant studies completed to date and  lasting 15 to 20 minutes of a 25 minute visit    See device teaching which extended face to face time for this visit.  Each maintenance medication was reviewed in detail including emphasizing most importantly the difference between maintenance and prns and under what circumstances the prns are to be triggered using an action plan format that is not reflected in the computer generated alphabetically organized AVS which I have not found useful in most complex patients, especially with respiratory illnesses  Please see AVS for specific instructions unique to this visit that I personally wrote and verbalized to the the pt in detail and then reviewed with pt  by my nurse highlighting any  changes in therapy recommended at today's visit to their plan of care.

## 2018-04-18 NOTE — Progress Notes (Signed)
LMTCB

## 2018-04-20 ENCOUNTER — Telehealth: Payer: Self-pay | Admitting: Internal Medicine

## 2018-04-20 MED ORDER — ALBUTEROL SULFATE HFA 108 (90 BASE) MCG/ACT IN AERS: 2.0000 | INHALATION_SPRAY | RESPIRATORY_TRACT | 1 refills | Status: DC | PRN

## 2018-04-20 MED ORDER — ALBUTEROL SULFATE (2.5 MG/3ML) 0.083% IN NEBU: 2.5000 mg | INHALATION_SOLUTION | Freq: Four times a day (QID) | RESPIRATORY_TRACT | 1 refills | Status: DC | PRN

## 2018-04-20 NOTE — Telephone Encounter (Signed)
Refill of albuterol neb sol and albuterol inhaler sent to pt's preferred pharmacy.  Called pt letting her know this was done and also stated to pt the results of the labwork and recommendations per MW with regards to that. Pt expressed understanding and stated she does have a f/u appt scheduled.  Nothing further needed.

## 2018-04-24 DIAGNOSIS — M25471 Effusion, right ankle: Secondary | ICD-10-CM | POA: Diagnosis not present

## 2018-04-24 DIAGNOSIS — M25571 Pain in right ankle and joints of right foot: Secondary | ICD-10-CM | POA: Diagnosis not present

## 2018-04-24 DIAGNOSIS — J45909 Unspecified asthma, uncomplicated: Secondary | ICD-10-CM | POA: Insufficient documentation

## 2018-04-25 ENCOUNTER — Encounter (HOSPITAL_COMMUNITY): Payer: Self-pay | Admitting: *Deleted

## 2018-04-25 ENCOUNTER — Other Ambulatory Visit: Payer: Self-pay

## 2018-04-25 ENCOUNTER — Emergency Department (HOSPITAL_COMMUNITY): Payer: Medicare Other

## 2018-04-25 ENCOUNTER — Emergency Department (HOSPITAL_COMMUNITY)
Admission: EM | Admit: 2018-04-25 | Discharge: 2018-04-25 | Disposition: A | Discharge status: Home / Self Care | Payer: Medicare Other | Attending: Emergency Medicine | Admitting: Emergency Medicine

## 2018-04-25 DIAGNOSIS — M25471 Effusion, right ankle: Secondary | ICD-10-CM

## 2018-04-25 DIAGNOSIS — M25571 Pain in right ankle and joints of right foot: Secondary | ICD-10-CM | POA: Diagnosis not present

## 2018-04-25 MED ORDER — HYDROCODONE-ACETAMINOPHEN 5-325 MG PO TABS: 1.0000 | ORAL_TABLET | Freq: Four times a day (QID) | ORAL | 0 refills | Status: DC | PRN

## 2018-04-25 MED ORDER — PREDNISONE 20 MG PO TABS
60.0000 mg | ORAL_TABLET | Freq: Once | ORAL | Status: AC
  Administered 2018-04-25: 60 mg via ORAL
  Filled 2018-04-25: qty 3

## 2018-04-25 MED ORDER — PREDNISONE 50 MG PO TABS: ORAL_TABLET | ORAL | 0 refills | Status: DC

## 2018-04-25 MED ORDER — HYDROCODONE-ACETAMINOPHEN 5-325 MG PO TABS
1.0000 | ORAL_TABLET | Freq: Once | ORAL | Status: AC
  Administered 2018-04-25: 1 via ORAL
  Filled 2018-04-25: qty 1

## 2018-04-25 NOTE — ED Triage Notes (Signed)
Pt says that since yesterday she has been having right foot pain/ankle/leg, pain concentrates around the right ankle with some swelling. No injury that she knows of, has been taking tylenol for the pain.

## 2018-04-25 NOTE — ED Notes (Signed)
Bed: WA05 Expected date:  Expected time:  Means of arrival:  Comments: 

## 2018-04-25 NOTE — ED Notes (Signed)
Bed: WA07 Expected date:  Expected time:  Means of arrival:  Comments: 

## 2018-04-25 NOTE — ED Provider Notes (Signed)
[facility]-EMERGENCY DEPT Provider Note   CSN: [id number] Arrival date & time: 04/24/18  2354     History   Chief Complaint Chief Complaint  Patient presents with  . Ankle Pain    HPI Lori Black is a 32 y.o. female.  The history is provided by the patient.  Ankle Pain   The incident occurred yesterday. There was no injury mechanism. The pain is present in the right ankle. The pain is moderate. The pain has been constant since onset. Associated symptoms include inability to bear weight. The symptoms are aggravated by bearing weight, palpation and activity. She has tried ice, elevation and heat for the symptoms. The treatment provided mild relief.  She reports isolated right ankle pain.  Denies falls or injuries.  No swelling.  No chest pain shortness of breath.  No fevers.  No history of surgery to her legs.  No history of DVT. No Previous history of joint swelling. No drug abuse reported Past Medical History:  Diagnosis Date  . Asthma     Patient Active Problem List   Diagnosis Date Noted  . Chest pain, musculoskeletal 07/19/2017  . Chronic asthma, mild persistent, uncomplicated 04/17/2017  . Asthma with acute exacerbation 10/11/2016  . Microcytic anemia 10/11/2016  . Influenza A 10/11/2016  . Acute respiratory failure (HCC) 10/09/2016    Past Surgical History:  Procedure Laterality Date  . CESAREAN SECTION       OB History   None      Home Medications    Prior to Admission medications   Medication Sig Start Date End Date Taking? Authorizing Provider  acetaminophen (TYLENOL) 325 MG tablet Take 650 mg by mouth every 6 (six) hours as needed for mild pain.   Yes [provider]  albuterol (PROVENTIL HFA;VENTOLIN HFA) 108 (90 Base) MCG/ACT inhaler Inhale 2 puffs into the lungs every 4 (four) hours as needed for wheezing or shortness of breath. 04/20/18  Yes Nyoka CowdenWert, Michael B, MD  albuterol (PROVENTIL) (2.5 MG/3ML) 0.083% nebulizer  solution Take 3 mLs (2.5 mg total) by nebulization every 6 (six) hours as needed for wheezing or shortness of breath. 04/20/18  Yes Nyoka CowdenWert, Michael B, MD  budesonide-formoterol (SYMBICORT) 160-4.5 MCG/ACT inhaler Inhale 2 puffs into the lungs 2 (two) times daily. 04/17/18  Yes Nyoka CowdenWert, Michael B, MD  pseudoephedrine-acetaminophen (TYLENOL SINUS) 30-500 MG TABS tablet Take 1 tablet by mouth every 4 (four) hours as needed (congestion).    Yes [provider]  budesonide-formoterol (SYMBICORT) 160-4.5 MCG/ACT inhaler Inhale 2 puffs into the lungs 2 (two) times daily. Patient not taking: Reported on 04/25/2018 01/16/18   Nyoka CowdenWert, Michael B, MD  HYDROcodone-acetaminophen (NORCO/VICODIN) 5-325 MG tablet Take 1 tablet by mouth every 6 (six) hours as needed for severe pain. 04/25/18   Zadie RhineWickline, Shalaine Payson, MD  montelukast (SINGULAIR) 10 MG tablet Take 1 tablet (10 mg total) by mouth at bedtime. Patient not taking: Reported on 04/25/2018 01/16/18   Nyoka CowdenWert, Michael B, MD  predniSONE (DELTASONE) 50 MG tablet 1 tablet PO QD X7 days 04/25/18   Zadie RhineWickline, Sarafina Puthoff, MD    Family History No family history on file.  Social History Social History   Tobacco Use  . Smoking status: Never Smoker  . Smokeless tobacco: Never Used  Substance Use Topics  . Alcohol use: Yes    Comment: occassionally  . Drug use: No     Allergies   Patient has no known allergies.   Review of Systems Review of Systems  Constitutional:  Negative for fever.  Respiratory: Negative for shortness of breath.   Cardiovascular: Negative for chest pain.  Gastrointestinal: Negative for vomiting.  Musculoskeletal: Positive for arthralgias.  Psychiatric/Behavioral: The patient is nervous/anxious.   All other systems reviewed and are negative.    Physical Exam Updated Vital Signs BP 111/70 (BP Location: Right Arm)   Pulse 90   Temp 98.9 F (37.2 C) (Oral)   Resp 15   Ht 1.676 m (5\' 6" )   Wt 65.8 kg (145 lb)   LMP 04/13/2018   SpO2 100%   BMI  23.40 kg/m   Physical Exam CONSTITUTIONAL: Well developed/well nourished HEAD: Normocephalic/atraumatic EYES: EOMI/PERRL ENMT: Mucous membranes moist NECK: supple no meningeal signs SPINE/BACK:entire spine nontender CV: S1/S2 noted, no murmurs/rubs/gallops noted LUNGS: Lungs are clear to auscultation bilaterally, no apparent distress ABDOMEN: soft, nontender, no rebound or guarding, bowel sounds noted throughout abdomen GU:no cva tenderness NEURO: Pt is awake/alert/appropriate, moves all extremitiesx4.  No facial droop.   EXTREMITIES: pulses normal/equal, full ROM Mild tenderness to right lateral malleolus, minimal warmth noted, mild edema to joint, distal pulses intact, no foot tenderness, no calf tenderness or edema, she can range the joint SKIN: warm, color normal PSYCH: no abnormalities of mood noted, alert and oriented to situation   ED Treatments / Results  Labs (all labs ordered are listed, but only abnormal results are displayed) Labs Reviewed - No data to display  EKG None  Radiology Dg Ankle Complete Right  Result Date: 04/25/2018 CLINICAL DATA:  Right ankle pain for 2 days. Swelling. No known injury. EXAM: RIGHT ANKLE - COMPLETE 3+ VIEW COMPARISON:  None. FINDINGS: There is no evidence of fracture or dislocation. There is a tibial talar joint effusion. There is no evidence of arthropathy or other focal bone abnormality. Lateral soft tissue edema. IMPRESSION: Nonspecific ankle joint effusion and lateral soft tissue edema. No osseous abnormalities. Electronically Signed   By: Rubye Oaks M.D.   On: 04/25/2018 01:04    Procedures Procedures  Narcotic database reviewed and considered in decision making  Medications Ordered in ED Medications  predniSONE (DELTASONE) tablet 60 mg (60 mg Oral Given 04/25/18 0210)  HYDROcodone-acetaminophen (NORCO/VICODIN) 5-325 MG per tablet 1 tablet (1 tablet Oral Given 04/25/18 0211)     Initial Impression / Assessment and Plan /  ED Course  I have reviewed the triage vital signs and the nursing notes.  Pertinent imaging results that were available during my care of the patient were reviewed by me and considered in my medical decision making (see chart for details).     Patient with isolated right ankle pain and mild swelling.  She does have evidence of small effusion on x-ray.  Low suspicion/low risk for septic joint.  Favor gouty arthritis or other noninfectious etiology.  Will place on course of prednisone and pain control.  Refer to orthopedics.  We discussed strict ER return precautions  Final Clinical Impressions(s) / ED Diagnoses   Final diagnoses:  Right ankle effusion    ED Discharge Orders        Ordered    predniSONE (DELTASONE) 50 MG tablet     04/25/18 0207    HYDROcodone-acetaminophen (NORCO/VICODIN) 5-325 MG tablet  Every 6 hours PRN     04/25/18 0207       Zadie Rhine, MD 04/25/18 0310

## 2018-07-19 ENCOUNTER — Ambulatory Visit: Payer: Medicare Other | Admitting: Internal Medicine

## 2018-07-30 ENCOUNTER — Ambulatory Visit (INDEPENDENT_AMBULATORY_CARE_PROVIDER_SITE_OTHER): Payer: Medicare Other | Admitting: Internal Medicine

## 2018-07-30 ENCOUNTER — Encounter: Payer: Self-pay | Admitting: Internal Medicine

## 2018-07-30 VITALS — BP 118/76 | HR 106 | Ht 66.0 in | Wt 143.6 lb

## 2018-07-30 DIAGNOSIS — J310 Chronic rhinitis: Secondary | ICD-10-CM | POA: Diagnosis not present

## 2018-07-30 DIAGNOSIS — R05 Cough: Secondary | ICD-10-CM

## 2018-07-30 DIAGNOSIS — J4530 Mild persistent asthma, uncomplicated: Secondary | ICD-10-CM

## 2018-07-30 DIAGNOSIS — R059 Cough, unspecified: Secondary | ICD-10-CM

## 2018-07-30 NOTE — Patient Instructions (Signed)
No change in medications   Work on inhaler technique:  relax and gently blow all the way out then take a nice smooth deep breath back in, triggering the inhaler at same time you start breathing in.  Hold for up to 5 seconds if you can. Blow out symbicort thru nose. Rinse and gargle with water when done    Please see patient coordinator before you leave today  to schedule sinus CT    Please schedule a follow up visit in 3 months but call sooner if needed

## 2018-07-30 NOTE — Progress Notes (Signed)
Subjective:     Patient ID: Lori Black, female   DOB: [birth year]    MRN: [id number]  Brief patient profile:  32 yowf never smoker asthma since childhood never free of symptoms or need for inhaler and frequent prednisone rx x every few months maint by [facility] doctor on symbicort fall 2017 with no change pattern > referred to pulmonary clinic 03/17/2017 by EDP p 3rd ER trip in 6 months with one admit for asthma        History of Present Illness  03/17/2017 1st [place] [facility] office visit/ Lori Black   Chief Complaint  Patient presents with  . Pulmonary Consult    Self referral. Pt states that she was dxed with Asthma at birth. She recently moved here from [facility]. She has been out of her Symbicort for the past month. She has been having increased SOB and cough with thick, yellow sputum recently.  She has been using albuterol neb and albuterol inhaler at least once per day.   just took last prednisone on day of ov Out of symb x sev weeks   But when on symb and pred only needs neb once a day  Has assoc nasal congestions and rattling cough worse at hs and in AM Baseline hfa very poor - see hfa  rec  Plan A = Automatic = symbicort 160 Take 2 puffs first thing in am and then another 2 puffs about 12 hours later.  Work on inhaler technique:   Plan B = Backup Only use your albuterol as a rescue medication             01/16/2018  f/u ov/Lori Black re: chronic asthma since childhood/ on disability due to asthma  maint on symb 160 Chief Complaint  Patient presents with  . Follow-up    f/u asthma, allergies, sinus problems causing breathing issues, using nebulizers more  baseline Dyspnea:  Not limited by breathing from desired activities   Cough: worse with pollen Sleep: 30 degrees, occ neb noct. Lots of noct nasal congestion SABA use:  Prior to 2-3 weeks prior to OV  Was not needing any saba now even needing neb rec Please remember to go to the lab department downstairs in the basement  for your tests - we  will call you with the results when they are available. Singulair(montelukast)  10 mg one daily  If not better prednisone x 6 days:   Prednisone 10 mg take  4 each am x 2 days,   2 each am x 2 days,  1 each am x 2 days and stop  Plan A = Automatic = symbicort 160 Take 2 puffs first thing in am and then another 2 puffs about 12 hours later.  Work on Set designer:   Plan B = Backup Only use your albuterol as a rescue medication Plan C = Crisis - only use your albuterol nebulizer if you first try Plan B and it fails to help > ok to use the nebulizer up to every 4 hours but if start needing it regularly call for immediate appointment     04/17/2018  f/u ov/Lori Black re: chronic mild asthma and rhintis with tendence to freq exac Chief Complaint  Patient presents with  . Follow-up    Breathing has improved some. She is c/o nasal congestion and "sinus stuff".  She is using her albuterol inhaler 2 x per wk and neb with albuterol once per wk on average.   Dyspnea:  MMRC1 = can walk  nl pace, flat grade, can't hurry or go uphills or steps s sob   Cough: daytime attributes to pnds SABA use: as above, way too much  02: none Sleeping: 1 pillow rec Work on perfecting your inhaler technique:  relax and gently blow all the way out then take a nice smooth deep breath back in, triggering the inhaler at same time you start breathing in.  Hold for up to 5 seconds if you can. Blow out thru nose. Rinse and gargle with water when done [facility] to take prednisone for your nasal symptoms Zyrtec is the best over the counter for nasal congestion as needed Please remember to go to the lab department downstairs in the basement  for your tests - we will call you with the results when they are available. Please schedule a follow up visit in 3 months but call sooner if needed  - consider sinus ct now    07/30/2018  f/u ov/Lori Black re: chronic asthma/ no better on singulair and neither were the nasal symptoms so  d/c'd singulair  Chief Complaint  Patient presents with  . Follow-up    Breathing is overall doing well. She does c/o sinus and nasal congestion.  She uses her albuterol inhaler 1-2 x per wk and neb once per wk on average.   Dyspnea:  Not limited by breathing from desired activities   Cough: none Sleeping: ok flat  SABA use: a couple time a week     No obvious day to day or daytime variability or assoc excess/ purulent sputum or mucus plugs or hemoptysis or cp or chest tightness, subjective wheeze or overt   hb symptoms.   Sleeping as above without nocturnal  or early am exacerbation  of respiratory  c/o's or need for noct saba. Also denies any obvious fluctuation of symptoms with weather or environmental changes or other aggravating or alleviating factors except as outlined above   No unusual exposure hx or h/o childhood pna/ asthma or knowledge of premature birth.  Current Allergies, Complete Past Medical History, Past Surgical History, Family History, and Social History were reviewed in [employer] record.  ROS  The following are not active complaints unless bolded Hoarseness, sore throat, dysphagia, dental problems, itching, sneezing,  nasal congestion or discharge of excess mucus or purulent secretions, ear ache,   fever, chills, sweats, unintended wt loss or wt gain, classically pleuritic or exertional cp,  orthopnea pnd or arm/hand swelling  or leg swelling, presyncope, palpitations, abdominal pain, anorexia, nausea, vomiting, diarrhea  or change in bowel habits or change in bladder habits, change in stools or change in urine, dysuria, hematuria,  rash, arthralgias, visual complaints, headache, numbness, weakness or ataxia or problems with walking or coordination,  change in mood or  memory.        Current Meds  Medication Sig  . albuterol (PROVENTIL HFA;VENTOLIN HFA) 108 (90 Base) MCG/ACT inhaler Inhale 2 puffs into the lungs every 4 (four) hours as needed for  wheezing or shortness of breath.  Marland Kitchen albuterol (PROVENTIL) (2.5 MG/3ML) 0.083% nebulizer solution Take 3 mLs (2.5 mg total) by nebulization every 6 (six) hours as needed for wheezing or shortness of breath.  . budesonide-formoterol (SYMBICORT) 160-4.5 MCG/ACT inhaler Inhale 2 puffs into the lungs 2 (two) times daily.  . pseudoephedrine-acetaminophen (TYLENOL SINUS) 30-500 MG TABS tablet Take 1 tablet by mouth every 4 (four) hours as needed (congestion).              Objective:  Physical Exam   amb wf nad    07/30/2018      143  04/17/2018          145  01/16/2018          150  04/17/2017          150    03/17/17 150 lb (68 kg)  03/07/17 150 lb 4 oz (68.2 kg)  10/09/16 162 lb 1.6 oz (73.5 kg)      Vital signs reviewed - Note on arrival 02 sats  98% on RA         HEENT: nl dentition,  and oropharynx. Nl external ear canals without cough reflex - moderate bilateral non-specific turbinate edema     NECK :  without JVD/Nodes/TM/ nl carotid upstrokes bilaterally   LUNGS: no acc muscle use,  Nl contour chest which is clear to A and P bilaterally without cough on insp or exp maneuvers   CV:  RRR  no s3 or murmur or increase in P2, and no edema   ABD:  soft and nontender with nl inspiratory excursion in the supine position. No bruits or organomegaly appreciated, bowel sounds nl  MS:  Nl gait/ ext warm without deformities, calf tenderness, cyanosis or clubbing No obvious joint restrictions   SKIN: warm and dry without lesions    NEURO:  alert, approp, nl sensorium with  no motor or cerebellar deficits apparent.        Assessment:

## 2018-07-31 ENCOUNTER — Encounter: Payer: Self-pay | Admitting: Internal Medicine

## 2018-07-31 DIAGNOSIS — J310 Chronic rhinitis: Secondary | ICD-10-CM | POA: Insufficient documentation

## 2018-07-31 NOTE — Assessment & Plan Note (Signed)
FENO 03/17/2017  =   30 on final day of prednisone - Spirometry 03/17/2017  wnl s curvature on last day of pred p saba 5 h prior  - 03/17/2017   Start symb 160 2bid - 01/16/2018  After extensive coaching inhaler device  effectiveness =    75% (short Ti)   - FENO 01/16/2018  =   62 on symb 160 2bid  - Spirometry 01/16/2018  wnl with  Min curvature  - 01/16/2018 added singulair - Allergy profile 04/17/2018 >  Eos 0.2 /  IgE  128 RAST pos cat > dog, dust and oak trees - 04/17/2018  After extensive coaching inhaler device  effectiveness =    75% (short Ti)  - FENO 04/17/2018  =  28 p  symb 160/ singulair  - 06/2018 did not refill singulair as no better     Despite continued exp to cats and no response to singualir > on symb 160 2bid >> All goals of chronic asthma control met including optimal function and elimination of symptoms with minimal need for rescue therapy.  Contingencies discussed in full including contacting this office immediately if not controlling the symptoms using the rule of two's.     No change in asthma rx needed   - The proper method of use, as well as anticipated side effects, of a metered-dose inhaler are discussed and demonstrated to the patient. Improved effectiveness after extensive coaching during this visit to a level of approximately 75 % from a baseline of 90 %

## 2018-07-31 NOTE — Assessment & Plan Note (Addendum)
-   Allergy profile 04/17/2018 >  Eos 0.2 /  IgE  128 RAST pos cat > dog, dust and oak trees No response to singulair as of 06/2018 so d/c'd  - Sinus CT ordered 07/30/2018   If sinus ct neg consider formal allergy eval/ in meantime try as much as possible to avoid cat exp esp in bedroom     I had an extended discussion with the patient reviewing all relevant studies completed to date and  lasting 15 to 20 minutes of a 25 minute visit    See device teaching which extended face to face time for this visit.  Each maintenance medication was reviewed in detail including emphasizing most importantly the difference between maintenance and prns and under what circumstances the prns are to be triggered using an action plan format that is not reflected in the computer generated alphabetically organized AVS which I have not found useful in most complex patients, especially with respiratory illnesses  Please see AVS for specific instructions unique to this visit that I personally wrote and verbalized to the the pt in detail and then reviewed with pt  by my nurse highlighting any  changes in therapy recommended at today's visit to their plan of care.

## 2018-08-07 ENCOUNTER — Ambulatory Visit (INDEPENDENT_AMBULATORY_CARE_PROVIDER_SITE_OTHER)
Admission: RE | Admit: 2018-08-07 | Discharge: 2018-08-07 | Disposition: A | Discharge status: Home / Self Care | Payer: Medicare Other | Source: Ambulatory Visit | Attending: Internal Medicine | Admitting: Internal Medicine

## 2018-08-07 DIAGNOSIS — R05 Cough: Secondary | ICD-10-CM | POA: Diagnosis not present

## 2018-08-07 DIAGNOSIS — J45909 Unspecified asthma, uncomplicated: Secondary | ICD-10-CM | POA: Diagnosis not present

## 2018-08-07 DIAGNOSIS — J4530 Mild persistent asthma, uncomplicated: Secondary | ICD-10-CM

## 2018-08-07 DIAGNOSIS — R059 Cough, unspecified: Secondary | ICD-10-CM

## 2018-08-07 NOTE — Progress Notes (Signed)
Left detailed msg on machine with results ok per DPR

## 2018-10-08 ENCOUNTER — Other Ambulatory Visit: Payer: Self-pay | Admitting: Internal Medicine

## 2018-10-08 ENCOUNTER — Telehealth: Payer: Self-pay | Admitting: Internal Medicine

## 2018-10-08 MED ORDER — ALBUTEROL SULFATE (2.5 MG/3ML) 0.083% IN NEBU: 2.5000 mg | INHALATION_SOLUTION | Freq: Four times a day (QID) | RESPIRATORY_TRACT | 3 refills | Status: DC | PRN

## 2018-10-08 MED ORDER — BUDESONIDE-FORMOTEROL FUMARATE 160-4.5 MCG/ACT IN AERO: 2.0000 | INHALATION_SPRAY | Freq: Two times a day (BID) | RESPIRATORY_TRACT | 0 refills | Status: DC

## 2018-10-08 NOTE — Telephone Encounter (Signed)
Left message informing patient her refill has been sent to [facility] on [address] and she has a sample upfront waiting on her to pick up. Informed patient to call if anything else was needed. Nothing further needed at this time.

## 2018-10-30 ENCOUNTER — Ambulatory Visit: Payer: Medicare Other | Admitting: Internal Medicine

## 2018-11-06 ENCOUNTER — Encounter: Payer: Self-pay | Admitting: Internal Medicine

## 2018-11-06 ENCOUNTER — Ambulatory Visit (INDEPENDENT_AMBULATORY_CARE_PROVIDER_SITE_OTHER): Payer: Medicare Other | Admitting: Internal Medicine

## 2018-11-06 VITALS — BP 106/72 | HR 86 | Ht 65.0 in | Wt 148.0 lb

## 2018-11-06 DIAGNOSIS — J310 Chronic rhinitis: Secondary | ICD-10-CM | POA: Diagnosis not present

## 2018-11-06 DIAGNOSIS — J4530 Mild persistent asthma, uncomplicated: Secondary | ICD-10-CM

## 2018-11-06 NOTE — Patient Instructions (Signed)
Work on inhaler technique:  relax and gently blow all the way out then take a nice smooth deep breath back in, triggering the inhaler at same time you start breathing in.  Hold for up to 5 seconds if you can. Blow symbicort out thru nose. Rinse and gargle with water when done   Please see patient coordinator before you leave today  to schedule ent referral   Please schedule a follow up visit in 6  months but call sooner if needed

## 2018-11-06 NOTE — Progress Notes (Signed)
Subjective:     Patient ID: Lori Black, female   DOB: February 13, 1986    MRN: [id number]  Brief patient profile:  50 yowf never smoker asthma since childhood never free of symptoms or need for inhaler and frequent prednisone rx x every few months maint by [facility] doctor on symbicort fall 2017 with no change pattern > referred to pulmonary clinic 03/17/2017 by EDP p 3rd ER trip in 6 months with one admit for asthma        History of Present Illness  03/17/2017 1st [place] [facility] office visit/ Lori Black   Chief Complaint  Patient presents with  . Pulmonary Consult    Self referral. Pt states that she was dxed with Asthma at birth. She recently moved here from [facility]. She has been out of her Symbicort for the past month. She has been having increased SOB and cough with thick, yellow sputum recently.  She has been using albuterol neb and albuterol inhaler at least once per day.   just took last prednisone on day of ov Out of symb x sev weeks   But when on symb and pred only needs neb once a day  Has assoc nasal congestions and rattling cough worse at hs and in AM Baseline hfa very poor - see hfa  rec  Plan A = Automatic = symbicort 160 Take 2 puffs first thing in am and then another 2 puffs about 12 hours later.  Work on inhaler technique:   Plan B = Backup Only use your albuterol as a rescue medication       01/16/2018  f/u ov/Lori Black re: chronic asthma since childhood/ on disability due to asthma  maint on symb 160 Chief Complaint  Patient presents with  . Follow-up    f/u asthma, allergies, sinus problems causing breathing issues, using nebulizers more  baseline Dyspnea:  Not limited by breathing from desired activities   Cough: worse with pollen Sleep: 30 degrees, occ neb noct. Lots of noct nasal congestion SABA use:  Prior to 2-3 weeks prior to OV  Was not needing any saba now even needing neb rec Please remember to go to the lab department downstairs in the basement  for your tests - we will call you  with the results when they are available. Singulair(montelukast)  10 mg one daily  If not better prednisone x 6 days:   Prednisone 10 mg take  4 each am x 2 days,   2 each am x 2 days,  1 each am x 2 days and stop  Plan A = Automatic = symbicort 160 Take 2 puffs first thing in am and then another 2 puffs about 12 hours later.  Work on Clinical biochemist:   Plan B = Backup Only use your albuterol as a rescue medication Plan C = Crisis - only use your albuterol nebulizer if you first try Plan B and it fails to help > ok to use the nebulizer up to every 4 hours but if start needing it regularly call for immediate appointment     04/17/2018  f/u ov/Lori Black re: chronic mild asthma and rhintis with tendence to [place] Chief Complaint  Patient presents with  . Follow-up    Breathing has improved some. She is c/o nasal congestion and "sinus stuff".  She is using her albuterol inhaler 2 x per wk and neb with albuterol once per wk on average.   Dyspnea:  MMRC1 = can walk nl pace, flat grade, can't hurry  or go uphills or steps s sob   Cough: daytime attributes to pnds SABA use: as above, way too much  02: none Sleeping: 1 pillow rec Work on perfecting your inhaler technique:  relax and gently blow all the way out then take a nice smooth deep breath back in, triggering the inhaler at same time you start breathing in.  Hold for up to 5 seconds if you can. Blow out thru nose. Rinse and gargle with water when done [facility] to take prednisone for your nasal symptoms Zyrtec is the best over the counter for nasal congestion as needed Please remember to go to the lab department downstairs in the basement  for your tests - we will call you with the results when they are available. Please schedule a follow up visit in 3 months but call sooner if needed  - consider sinus ct now    07/30/2018  f/u ov/Lori Black re: chronic asthma/ no better on singulair and neither were the nasal symptoms so d/c'd  singulair  Chief Complaint  Patient presents with  . Follow-up    Breathing is overall doing well. She does c/o sinus and nasal congestion.  She uses her albuterol inhaler 1-2 x per wk and neb once per wk on average.   Dyspnea:  Not limited by breathing from desired activities   Cough: none Sleeping: ok flat  SABA use: a couple time a week  rec No change in medications Work on inhaler technique:   schedule sinus CT > ok x prominent concha bullosa    11/06/2018  f/u ov/Lori Black re:  Chronic asthma no change on vs off singulair, nasal steroids cause bleeding/ lots of nasal congestion Chief Complaint  Patient presents with  . Follow-up    sinus congestion, little cough with clear mucus, SOB with outside activity,  Dyspnea:  Not limited by breathing from desired activities  / worse outdoor Cough: minimal / outdoors mostly  Sleeping:  Flat bed / a bunch of pillows SABA use: once or twice a week 02: none    No obvious day to day or daytime variability or assoc excess/ purulent sputum or mucus plugs or hemoptysis or cp or chest tightness, subjective wheeze or overt sinus or hb symptoms.   sleeping ok  without nocturnal  or early am exacerbation  of respiratory  c/o's or need for noct saba. Also denies any obvious fluctuation of symptoms with weather or environmental changes or other aggravating or alleviating factors except as outlined above   No unusual exposure hx or h/o childhood pna  or knowledge of premature birth.  Current Allergies, Complete Past Medical History, Past Surgical History, Family History, and Social History were reviewed in [employer] record.  ROS  The following are not active complaints unless bolded Hoarseness, sore throat, dysphagia, dental problems, itching, sneezing,  nasal congestion or discharge of excess mucus or purulent secretions, ear ache,   fever, chills, sweats, unintended wt loss or wt gain, classically pleuritic or exertional cp,   orthopnea pnd or arm/hand swelling  or leg swelling, presyncope, palpitations, abdominal pain, anorexia, nausea, vomiting, diarrhea  or change in bowel habits or change in bladder habits, change in stools or change in urine, dysuria, hematuria,  rash, arthralgias, visual complaints, headache, numbness, weakness or ataxia or problems with walking or coordination,  change in mood or  memory.        Current Meds  Medication Sig  . albuterol (PROVENTIL HFA;VENTOLIN HFA) 108 (90 Base) MCG/ACT inhaler  Inhale 2 puffs into the lungs every 4 (four) hours as needed for wheezing or shortness of breath.  Marland Kitchen albuterol (PROVENTIL) (2.5 MG/3ML) 0.083% nebulizer solution Take 3 mLs (2.5 mg total) by nebulization every 6 (six) hours as needed for wheezing or shortness of breath.  . budesonide-formoterol (SYMBICORT) 160-4.5 MCG/ACT inhaler Inhale 2 puffs into the lungs 2 (two) times daily.  . pseudoephedrine-acetaminophen (TYLENOL SINUS) 30-500 MG TABS tablet Take 1 tablet by mouth every 4 (four) hours as needed (congestion).                           Objective:   Physical Exam   amb wf   11/06/2018        148  07/30/2018      143  04/17/2018          145  01/16/2018          150  04/17/2017          150    03/17/17 150 lb (68 kg)  03/07/17 150 lb 4 oz (68.2 kg)  10/09/16 162 lb 1.6 oz (73.5 kg)      Vital signs reviewed - Note on arrival 02 sats  98% on RA        HEENT: nl dentition  and oropharynx. Nl external ear canals without cough reflex - moderate bilateral non-specific turbinate edema     NECK :  without JVD/Nodes/TM/ nl carotid upstrokes bilaterally   LUNGS: no acc muscle use,  Nl contour chest which is clear to A and P bilaterally without cough on insp or exp maneuvers   CV:  RRR  no s3 or murmur or increase in P2, and no edema   ABD:  soft and nontender with nl inspiratory excursion in the supine position. No bruits or organomegaly appreciated, bowel sounds nl  MS:  Nl gait/  ext warm without deformities, calf tenderness, cyanosis or clubbing No obvious joint restrictions   SKIN: warm and dry without lesions    NEURO:  alert, approp, nl sensorium with  no motor or cerebellar deficits apparent.              Assessment:

## 2018-11-07 ENCOUNTER — Encounter: Payer: Self-pay | Admitting: Internal Medicine

## 2018-11-07 NOTE — Assessment & Plan Note (Addendum)
Onset in childhood - Allergy profile 04/17/2018 >  Eos 0.2 /  IgE  128 RAST pos cat > dog, dust and oak trees No response to singulair as of 06/2018 so d/c'd - Sinus CT  08/07/2018 1. Mild patchy mucosal thickening. 2. Prominent bilateral concha bullosa.> referred to ent as no cat/dog and symptoms are year round /worse outside and not suggestive of an allergic mech   Her main problem at this point is that she cannot breathe out through her nose and cannot tolerate nasal steroids.  It seems reasonable to get an opinion from ENT on this based on the CT scan findings.   I had an extended discussion with the patient reviewing all relevant studies completed to date and  lasting 15 to 20 minutes of a 25 minute visit    Each maintenance medication was reviewed in detail including most importantly the difference between maintenance and prns and under what circumstances the prns are to be triggered using an action plan format that is not reflected in the computer generated alphabetically organized AVS.    See device teaching which extended face to face time for this visit   Please see AVS for specific instructions unique to this visit that I personally wrote and verbalized to the the pt in detail and then reviewed with pt  by my nurse highlighting any  changes in therapy recommended at today's visit to their plan of care.

## 2018-11-07 NOTE — Assessment & Plan Note (Addendum)
Onset in childhood FENO 03/17/2017  =   30 on final day of prednisone - Spirometry 03/17/2017  wnl s curvature on last day of pred p saba 5 h prior  - 03/17/2017   Start symb 160 2bid  - FENO 01/16/2018  =   62 on symb 160 2bid  - Spirometry 01/16/2018  wnl with  Min curvature  - 01/16/2018 added singulair - Allergy profile 04/17/2018 >  Eos 0.2 /  IgE  128 RAST pos cat > dog, dust and oak trees - FENO 04/17/2018  =  28 p  symb 160/ singulair  - 06/2018 did not refill singulair as no better     11/06/2018  After extensive coaching inhaler device,  effectiveness =    75% (short ti)    All goals of chronic asthma control met including optimal function and elimination of symptoms with minimal need for rescue therapy.  Contingencies discussed in full including contacting this office immediately if not controlling the symptoms using the rule of two's.

## 2018-11-12 ENCOUNTER — Encounter: Payer: Self-pay | Admitting: *Deleted

## 2018-11-12 ENCOUNTER — Telehealth: Payer: Self-pay | Admitting: Internal Medicine

## 2018-11-12 NOTE — Telephone Encounter (Signed)
Letter has been signed by Dr. Sherene Sires. Pt is aware that this letter is ready to be picked up. Letter has been placed up front. Nothing further was needed.

## 2018-11-12 NOTE — Telephone Encounter (Signed)
Spoke with patient-she needs letter from [facility] stating she still has pulmonary dx's and all medications that she is currently on for this. Pt needs this before Wednesday afternoon.   This is for disability appeal process. Pt only has a 5-10 day window to get all documents sent it.

## 2018-11-12 NOTE — Telephone Encounter (Signed)
Letter should state she has chronic persistent asthma assoc with poorly controlled rhinitis for which she has been referred to ENT.  She is at risk of severe asthma exacerbations and needs to stay on her maintenance asthma program to control her symptoms and prevent flares.  List active meds 

## 2018-11-12 NOTE — Telephone Encounter (Signed)
Letter has been written and taken to Dr. Sherene Sires to be signed.

## 2018-11-12 NOTE — Telephone Encounter (Signed)
Letter should state she has chronic persistent asthma assoc with poorly controlled rhinitis for which she has been referred to ENT.  She is at risk of severe asthma exacerbations and needs to stay on her maintenance asthma program to control her symptoms and prevent flares.  List active meds

## 2018-11-13 ENCOUNTER — Telehealth: Payer: Self-pay | Admitting: Internal Medicine

## 2018-11-13 NOTE — Telephone Encounter (Signed)
Will forward to [place] for when she checks mailbox for MW.

## 2018-11-13 NOTE — Telephone Encounter (Signed)
Form done and faxed to [facility] AND [facility] Spoke with the pt and notified that this was done  Nothing further needed

## 2018-11-20 DIAGNOSIS — J320 Chronic maxillary sinusitis: Secondary | ICD-10-CM | POA: Diagnosis not present

## 2018-11-20 DIAGNOSIS — J310 Chronic rhinitis: Secondary | ICD-10-CM | POA: Diagnosis not present

## 2018-12-26 ENCOUNTER — Telehealth: Payer: Self-pay | Admitting: Internal Medicine

## 2018-12-26 MED ORDER — BUDESONIDE-FORMOTEROL FUMARATE 160-4.5 MCG/ACT IN AERO: 2.0000 | INHALATION_SPRAY | Freq: Two times a day (BID) | RESPIRATORY_TRACT | 0 refills | Status: DC

## 2018-12-26 NOTE — Telephone Encounter (Signed)
Called and spoke with Patient. Patient requested Symbicort 160 samples.  Patient stated that Symbicort works really well for her.  Symbicort samples placed at front desk for pick up. Nothing further at this time.      Instructions   Work on inhaler technique:  relax and gently blow all the way out then take a nice smooth deep breath back in, triggering the inhaler at same time you start breathing in.  Hold for up to 5 seconds if you can. Blow symbicort out thru nose. Rinse and gargle with water when done   Please see patient coordinator before you leave today  to schedule ent referral   Please schedule a follow up visit in 6  months but call sooner if needed

## 2019-01-31 ENCOUNTER — Telehealth: Payer: Self-pay

## 2019-01-31 NOTE — Telephone Encounter (Signed)
Called and spoke with pt stating to her the info per MW. Pt stated she would call her insurance company to see if they could be able to provide her a formulary list and after she called them, if they were able to provide her a list she would call us back to let us know.  At that point, we will need to get pt scheduled for an office visit with MW so he can be able to further look at the formulary. Will await a return call from pt.

## 2019-01-31 NOTE — Telephone Encounter (Signed)
If she is using her insurance to pay for any of her meds, then the insurance should be able to provide her with a list of cheaper alternatives  Called a drug formulary but doesn't profide up with this info but will provide it to her on request.  She can make appt with this list in hand and we can pick pest alternative and give samples is possible

## 2019-01-31 NOTE — Telephone Encounter (Signed)
Pt calling back about her medication list from the insurance [phone]

## 2019-01-31 NOTE — Telephone Encounter (Signed)
Called and spoke with pt. Pt stated that she does not have prescription insurance and had been receiving her Symbicort through [employer] but stated that [employer] has lost her paperwork so she is having to redo all of the paperwork again due to this.  Pt stated that she is beginning to run low on her Symbicort and due to not having prescription insurance, the Symbicort will cost her too much. Pt is wanting to know if there is a different med that she could be prescribed instead of Symbicort. Dr. Sherene Sires, please advise on this for pt. Thanks!

## 2019-02-01 NOTE — Telephone Encounter (Signed)
Attempted to call pt but unable to reach. Left message for pt to return call. 

## 2019-02-04 ENCOUNTER — Telehealth: Payer: Self-pay | Admitting: Internal Medicine

## 2019-02-04 NOTE — Telephone Encounter (Signed)
Attempted to call pt but unable to reach. Left message for pt to return call. 

## 2019-02-04 NOTE — Telephone Encounter (Signed)
Returned call to patient. She has downloaded formulary on her cell phone. She has scheduled appt for Wed and will bring formulary. Nothing further needed.

## 2019-02-05 NOTE — Telephone Encounter (Signed)
Pt returned call as a separate encounter was done. This has been taken care of for pt. Nothing further needed.

## 2019-02-06 ENCOUNTER — Other Ambulatory Visit: Payer: Self-pay

## 2019-02-06 ENCOUNTER — Ambulatory Visit (INDEPENDENT_AMBULATORY_CARE_PROVIDER_SITE_OTHER): Payer: Medicare Other | Admitting: Internal Medicine

## 2019-02-06 ENCOUNTER — Encounter: Payer: Self-pay | Admitting: Internal Medicine

## 2019-02-06 VITALS — BP 98/62 | HR 102 | Ht 65.0 in | Wt 145.6 lb

## 2019-02-06 DIAGNOSIS — J4530 Mild persistent asthma, uncomplicated: Secondary | ICD-10-CM

## 2019-02-06 DIAGNOSIS — J310 Chronic rhinitis: Secondary | ICD-10-CM | POA: Diagnosis not present

## 2019-02-06 MED ORDER — BUDESONIDE-FORMOTEROL FUMARATE 160-4.5 MCG/ACT IN AERO: 2.0000 | INHALATION_SPRAY | Freq: Two times a day (BID) | RESPIRATORY_TRACT | 0 refills | Status: DC

## 2019-02-06 NOTE — Progress Notes (Signed)
Subjective:     Patient ID: Lori Black, female   DOB: February 13, 1986    MRN: [id number]  Brief patient profile:  50 yowf never smoker asthma since childhood never free of symptoms or need for inhaler and frequent prednisone rx x every few months maint by [facility] doctor on symbicort fall 2017 with no change pattern > referred to pulmonary clinic 03/17/2017 by EDP p 3rd ER trip in 6 months with one admit for asthma        History of Present Illness  03/17/2017 1st [facility] office visit/    Chief Complaint  Patient presents with  . Pulmonary Consult    Self referral. Pt states that she was dxed with Asthma at birth. She recently moved here from [facility]. She has been out of her Symbicort for the past month. She has been having increased SOB and cough with thick, yellow sputum recently.  She has been using albuterol neb and albuterol inhaler at least once per day.   just took last prednisone on day of ov Out of symb x sev weeks   But when on symb and pred only needs neb once a day  Has assoc nasal congestions and rattling cough worse at hs and in AM Baseline hfa very poor - see hfa  rec  Plan A = Automatic = symbicort 160 Take 2 puffs first thing in am and then another 2 puffs about 12 hours later.  Work on inhaler technique:   Plan B = Backup Only use your albuterol as a rescue medication       01/16/2018  f/u ov/ re: chronic asthma since childhood/ on disability due to asthma  maint on symb 160 Chief Complaint  Patient presents with  . Follow-up    f/u asthma, allergies, sinus problems causing breathing issues, using nebulizers more  baseline Dyspnea:  Not limited by breathing from desired activities   Cough: worse with pollen Sleep: 30 degrees, occ neb noct. Lots of noct nasal congestion SABA use:  Prior to 2-3 weeks prior to OV  Was not needing any saba now even needing neb rec Please remember to go to the lab department downstairs in the basement  for your tests - we will call you  with the results when they are available. Singulair(montelukast)  10 mg one daily  If not better prednisone x 6 days:   Prednisone 10 mg take  4 each am x 2 days,   2 each am x 2 days,  1 each am x 2 days and stop  Plan A = Automatic = symbicort 160 Take 2 puffs first thing in am and then another 2 puffs about 12 hours later.  Work on Clinical biochemist:   Plan B = Backup Only use your albuterol as a rescue medication Plan C = Crisis - only use your albuterol nebulizer if you first try Plan B and it fails to help > ok to use the nebulizer up to every 4 hours but if start needing it regularly call for immediate appointment     04/17/2018  f/u ov/ re: chronic mild asthma and rhintis with tendence to [place] Chief Complaint  Patient presents with  . Follow-up    Breathing has improved some. She is c/o nasal congestion and "sinus stuff".  She is using her albuterol inhaler 2 x per wk and neb with albuterol once per wk on average.   Dyspnea:  MMRC1 = can walk nl pace, flat grade, can't hurry  or go uphills or steps s sob   Cough: daytime attributes to pnds SABA use: as above, way too much  02: none Sleeping: 1 pillow rec Work on perfecting your inhaler technique:  relax and gently blow all the way out then take a nice smooth deep breath back in, triggering the inhaler at same time you start breathing in.  Hold for up to 5 seconds if you can. Blow out thru nose. Rinse and gargle with water when done [facility] your nasal symptoms Zyrtec is the best over the counter for nasal congestion as needed Please remember to go to the lab department downstairs in the basement  for your tests - we will call you with the results when they are available. Please schedule a follow up visit in 3 months but call sooner if needed  - consider sinus ct now    07/30/2018  f/u ov/ re: chronic asthma/ no better on singulair and neither were the nasal symptoms so d/c'd  singulair  Chief Complaint  Patient presents with  . Follow-up    Breathing is overall doing well. She does c/o sinus and nasal congestion.  She uses her albuterol inhaler 1-2 x per wk and neb once per wk on average.   Dyspnea:  Not limited by breathing from desired activities   Cough: none Sleeping: ok flat  SABA use: a couple time a week  rec No change in medications Work on inhaler technique:   schedule sinus CT > ok x prominent concha bullosa    11/06/2018  f/u ov/ re:  Chronic asthma no change on vs off singulair, nasal steroids cause bleeding/ lots of nasal congestion Chief Complaint  Patient presents with  . Follow-up    sinus congestion, little cough with clear mucus, SOB with outside activity,  Dyspnea:  Not limited by breathing from desired activities  / worse outdoor Cough: minimal / outdoors mostly  Sleeping:  Flat bed / a bunch of pillows SABA use: once or twice a week rec Work on inhaler technique:    Please see patient coordinator before you leave today  to schedule ent referral > marcellino eval 11/20/18 rec flonase/ns irrigation and allegra > helps some       02/06/2019  f/u ov/ re: chronic asthma Chief Complaint  Patient presents with  . Follow-up    f/u asthma   Dyspnea:  Does ok at nl pace some hills/steps do make her sob   Cough: minimal with pollen exp moslty  Sleeping: bed is flat / 2 pillows  SABA use: while on symbicort 160 2bid needs once a week saba  02: none   No obvious day to day or daytime variability or assoc excess/ purulent sputum or mucus plugs or hemoptysis or cp or chest tightness, subjective wheeze or overt sinus or hb symptoms.   Sleeping as above without nocturnal  or early am exacerbation  of respiratory  c/o's or need for noct saba. Also denies any obvious fluctuation of symptoms with weather or environmental changes or other aggravating or alleviating factors except as outlined above   No unusual exposure hx or h/o  childhood pna/ asthma or knowledge of premature birth.  Current Allergies, Complete Past Medical History, Past Surgical History, Family History, and Social History were reviewed in [facility] electronic medical record.  ROS  The following are not active complaints unless bolded Hoarseness, sore throat, dysphagia, dental problems, itching, sneezing,  nasal congestion or discharge of excess mucus or  purulent secretions, ear ache,   fever, chills, sweats, unintended wt loss or wt gain, classically pleuritic or exertional cp,  orthopnea pnd or arm/hand swelling  or leg swelling, presyncope, palpitations, abdominal pain, anorexia, nausea, vomiting, diarrhea  or change in bowel habits or change in bladder habits, change in stools or change in urine, dysuria, hematuria,  rash, arthralgias, visual complaints, headache, numbness, weakness or ataxia or problems with walking or coordination,  change in mood or  memory.        Current Meds  Medication Sig  . albuterol (PROVENTIL HFA;VENTOLIN HFA) 108 (90 Base) MCG/ACT inhaler Inhale 2 puffs into the lungs every 4 (four) hours as needed for wheezing or shortness of breath.  Marland Kitchen albuterol (PROVENTIL) (2.5 MG/3ML) 0.083% nebulizer solution Take 3 mLs (2.5 mg total) by nebulization every 6 (six) hours as needed for wheezing or shortness of breath.  . budesonide-formoterol (SYMBICORT) 160-4.5 MCG/ACT inhaler Inhale 2 puffs into the lungs 2 (two) times daily.  . budesonide-formoterol (SYMBICORT) 160-4.5 MCG/ACT inhaler Inhale 2 puffs into the lungs 2 (two) times daily.  . pseudoephedrine-acetaminophen (TYLENOL SINUS) 30-500 MG TABS tablet Take 1 tablet by mouth every 4 (four) hours as needed (congestion).                         Objective:   Physical Exam   amb wf nad    02/06/2019        145  11/06/2018        148  07/30/2018      143  04/17/2018          145  01/16/2018          150  04/17/2017          150    03/17/17 150 lb (68 kg)  03/07/17 150  lb 4 oz (68.2 kg)  10/09/16 162 lb 1.6 oz (73.5 kg)      Vital signs reviewed - Note on arrival 02 sats  100% on RA   HEENT: Poor dentition, nl turbinates bilaterally, and oropharynx. Nl external ear canals without cough reflex   NECK :  without JVD/Nodes/TM/ nl carotid upstrokes bilaterally   LUNGS: no acc muscle use,  Nl contour chest which is clear to A and P bilaterally without cough on insp or exp maneuvers   CV:  RRR  no s3 or murmur or increase in P2, and no edema   ABD:  soft and nontender with nl inspiratory excursion in the supine position. No bruits or organomegaly appreciated, bowel sounds nl  MS:  Nl gait/ ext warm without deformities, calf tenderness, cyanosis or clubbing No obvious joint restrictions   SKIN: warm and dry without lesions    NEURO:  alert, approp, nl sensorium with  no motor or cerebellar deficits apparent.                      Assessment:

## 2019-02-06 NOTE — Patient Instructions (Signed)
Plan A = Automatic = symbicort 160 x 2 each am and then the pm dose optional   Plan B = Backup Only use your albuterol inhaler as a rescue medication to be used if you can't catch your breath by resting or doing a relaxed purse lip breathing pattern.  - The less you use it, the better it will work when you need it. - Ok to use the inhaler up to 2 puffs  every 4 hours if you must but call for appointment if use goes up over your usual need - Don't leave home without it !!  (think of it like the spare tire for your car)   Plan C = Crisis - only use your albuterol nebulizer if you first try Plan B and it fails to help > ok to use the nebulizer up to every 4 hours but if start needing it regularly call for immediate appointment   Plan D = Doctor - call me if B and C not adequate  Plan E = ER - go to ER or call 911 if all else fails     Please schedule a follow up visit in 3 months but call sooner if needed

## 2019-02-07 ENCOUNTER — Encounter: Payer: Self-pay | Admitting: Internal Medicine

## 2019-02-07 NOTE — Assessment & Plan Note (Signed)
Onset in childhood FENO 03/17/2017  =   30 on final day of prednisone - Spirometry 03/17/2017  wnl s curvature on last day of pred p saba 5 h prior  - 03/17/2017   Start symb 160 2bid - 01/16/2018  After extensive coaching inhaler device  effectiveness =    75% (short Ti)   - FENO 01/16/2018  =   62 on symb 160 2bid  - Spirometry 01/16/2018  wnl with  Min curvature  - 01/16/2018 added singulair - Allergy profile 04/17/2018 >  Eos 0.2 /  IgE  128 RAST pos cat > dog, dust and oak trees - FENO 04/17/2018  =  30 p  symb 160/ singulair  - 06/2018 did not refill singulair as no better    - 02/06/2019  After extensive coaching inhaler device,  effectiveness =    90% with hfa   As long has access to symb 160 >> All goals of chronic asthma control met including optimal function and elimination of symptoms with minimal need for rescue therapy.  Contingencies discussed in full including contacting this office immediately if not controlling the symptoms using the rule of two's.      I had an extended discussion with the patient reviewing all relevant studies completed to date and  lasting 15 to 20 minutes of a 25 minute visit    See device teaching which extended face to face time for this visit.  Each maintenance medication was reviewed in detail including emphasizing most importantly the difference between maintenance and prns and under what circumstances the prns are to be triggered using an action plan format that is not reflected in the computer generated alphabetically organized AVS which I have not found useful in most complex patients, especially with respiratory illnesses  Please see AVS for specific instructions unique to this visit that I personally wrote and verbalized to the the pt in detail and then reviewed with pt  by my nurse highlighting any  changes in therapy recommended at today's visit to their plan of care.

## 2019-02-07 NOTE — Assessment & Plan Note (Signed)
Onset in childhood - Allergy profile 04/17/2018 >  Eos 0.2 /  IgE  128 RAST pos cat > dog, dust and oak trees No response to singulair as of 06/2018 so d/c'd - Sinus CT  08/07/2018 1. Mild patchy mucosal thickening. 2. Prominent bilateral concha bullosa.> referred to ent as no cat/dog and symptoms are year round /worse outside and not suggestive of an allergic mech   - referred to ent>>>  [name] eval 11/20/18 rec flonase/ns irrigation and allegra > helps some   Adequate control on present rx, reviewed in detail with pt > no change in rx needed

## 2019-03-12 ENCOUNTER — Telehealth: Payer: Self-pay | Admitting: Internal Medicine

## 2019-03-12 NOTE — Telephone Encounter (Signed)
LMTCB

## 2019-03-12 NOTE — Telephone Encounter (Signed)
Pt calling requesting samples of Symbicort. Dr. Sherene Sires, please advise if you are okay with [place] giving pt samples. Thanks!

## 2019-03-13 MED ORDER — BUDESONIDE-FORMOTEROL FUMARATE 160-4.5 MCG/ACT IN AERO: 2.0000 | INHALATION_SPRAY | Freq: Two times a day (BID) | RESPIRATORY_TRACT | 0 refills | Status: DC

## 2019-03-13 NOTE — Telephone Encounter (Signed)
Spoke with patient about signing up with [employer] for Symbicort. She stated that she is in the process of applying. She last sent over paperwork about 2 weeks ago and is waiting on them to approve her.   She is aware that I have placed 2 samples up front for her.   Nothing further needed at time of call.

## 2019-03-13 NOTE — Telephone Encounter (Signed)
Patient is returning phone call.  Patient phone number is [phone].

## 2019-03-13 NOTE — Telephone Encounter (Signed)
Ok x 2 but will need to explore other options like [employer] if problems paying for them thru her insurance (or formulary subsititute ) when returns so she should bring formulary next ov

## 2019-03-13 NOTE — Telephone Encounter (Signed)
Left message for patient to call back  

## 2019-04-01 ENCOUNTER — Emergency Department (HOSPITAL_COMMUNITY): Payer: Medicare Other

## 2019-04-01 ENCOUNTER — Emergency Department (HOSPITAL_COMMUNITY)
Admission: EM | Admit: 2019-04-01 | Discharge: 2019-04-01 | Disposition: A | Discharge status: Home / Self Care | Payer: Medicare Other | Attending: Emergency Medicine | Admitting: Emergency Medicine

## 2019-04-01 ENCOUNTER — Other Ambulatory Visit: Payer: Self-pay

## 2019-04-01 ENCOUNTER — Encounter (HOSPITAL_COMMUNITY): Payer: Self-pay

## 2019-04-01 DIAGNOSIS — S6991XA Unspecified injury of right wrist, hand and finger(s), initial encounter: Secondary | ICD-10-CM | POA: Diagnosis not present

## 2019-04-01 DIAGNOSIS — W208XXA Other cause of strike by thrown, projected or falling object, initial encounter: Secondary | ICD-10-CM | POA: Insufficient documentation

## 2019-04-01 DIAGNOSIS — Y929 Unspecified place or not applicable: Secondary | ICD-10-CM | POA: Diagnosis not present

## 2019-04-01 DIAGNOSIS — S63501A Unspecified sprain of right wrist, initial encounter: Secondary | ICD-10-CM | POA: Diagnosis not present

## 2019-04-01 DIAGNOSIS — J45909 Unspecified asthma, uncomplicated: Secondary | ICD-10-CM | POA: Diagnosis not present

## 2019-04-01 DIAGNOSIS — Y939 Activity, unspecified: Secondary | ICD-10-CM | POA: Diagnosis not present

## 2019-04-01 DIAGNOSIS — Y999 Unspecified external cause status: Secondary | ICD-10-CM | POA: Diagnosis not present

## 2019-04-01 NOTE — ED Triage Notes (Signed)
Pt injured her wrist about three weeks ago and then again tonight when she tried to open a soda

## 2019-04-01 NOTE — ED Provider Notes (Signed)
Emergency Department Provider Note   I have reviewed the triage vital signs and the nursing notes.   HISTORY  Chief Complaint Wrist Pain   HPI Lori Black is a 33 y.o. female presents to the ED with right wrist pain. Patient dropped an iPad several weeks back and caught the edge of the device on the volar surface of the wrist. She notes some mild pain at the time which has become persistent. Pain worse with movement. No additional injury. No fever, chills, or joint redness. No radiation of symptoms. Pain with finger flexion.   Past Medical History:  Diagnosis Date  . Asthma     Patient Active Problem List   Diagnosis Date Noted  . Rhinitis, chronic 07/31/2018  . Chest pain, musculoskeletal 07/19/2017  . Chronic asthma, mild persistent, uncomplicated 96/78/9381  . Asthma with acute exacerbation 10/11/2016  . Microcytic anemia 10/11/2016  . Influenza A 10/11/2016  . Acute respiratory failure ([place]) 10/09/2016    Past Surgical History:  Procedure Laterality Date  . CESAREAN SECTION      Allergies Patient has no known allergies.  History reviewed. No pertinent family history.  Social History Social History   Tobacco Use  . Smoking status: Never Smoker  . Smokeless tobacco: Never Used  Substance Use Topics  . Alcohol use: Yes    Comment: occassionally  . Drug use: No    Review of Systems  Constitutional: No fever/chills Musculoskeletal: Negative for back pain. Positive right wrist pain with focal area of swelling.  Skin: Negative for rash. Neurological: Negative for headaches, focal weakness or numbness.  10-point ROS otherwise negative.  ____________________________________________   PHYSICAL EXAM:  VITAL SIGNS: ED Triage Vitals  Enc Vitals Group     BP 04/01/19 0030 (!) 155/83     Pulse Rate 04/01/19 0030 90     Resp 04/01/19 0030 16     Temp 04/01/19 0030 99.3 F (37.4 C)     Temp Source 04/01/19 0030 Oral     SpO2 04/01/19 0030 100 %   Weight 04/01/19 0030 145 lb (65.8 kg)     Height 04/01/19 0030 5\' 5"  (1.651 m)     Pain Score 04/01/19 0039 7   Constitutional: Alert and oriented. Well appearing and in no acute distress. Eyes: Conjunctivae are normal. Head: Atraumatic. Nose: No congestion/rhinnorhea. Mouth/Throat: Mucous membranes are moist.  Neck: No stridor.  Cardiovascular: Normal rate, regular rhythm. Good peripheral circulation. Grossly normal heart sounds.   Respiratory: Normal respiratory effort.  Gastrointestinal: Soft and nontender. Musculoskeletal: Right rist pain with palpation along the volar surface. Focal/nodular area of swelling palpable with tenderness. No erythema. No warmth. Normal flexion strength with subjective pain. Neurovascular exam normal.  Neurologic:  Normal speech and language. No gross focal neurologic deficits are appreciated.  Skin:  Skin is warm, dry and intact. No rash noted.  ____________________________________________  RADIOLOGY  Dg Wrist Complete Right  Result Date: 04/01/2019 CLINICAL DATA:  Wrist injury 3 weeks ago, re-injury tonight. EXAM: RIGHT WRIST - COMPLETE 3+ VIEW COMPARISON:  None. FINDINGS: Irregular bone density adjacent to the ulnar styloid could reflect small avulsed ulnar styloid fragment or chondrocalcinosis of the triangular fibrocartilage. No additional acute bony abnormality. Joint spaces maintained. IMPRESSION: Small irregular bony density adjacent to the ulnar styloid could reflect small avulsed fragment or chondrocalcinosis of the triangular fibrocartilage (favored). Electronically Signed   By: Rolm Baptise M.D.   On: 04/01/2019 01:34    ____________________________________________   PROCEDURES  Procedure(s) performed:  Procedures  None ____________________________________________   INITIAL IMPRESSION / ASSESSMENT AND PLAN / ED COURSE  Pertinent labs & imaging results that were available during my care of the patient were reviewed by me and  considered in my medical decision making (see chart for details).   Patient with right wrist swelling after minor injury. No fever. Focal area of avulsed bone on x-ray. Volar splint applied. Tylenol/Motrin for pain. Will f/u with hand surgery as an outpatient.    ____________________________________________  FINAL CLINICAL IMPRESSION(S) / ED DIAGNOSES  Final diagnoses:  Sprain of right wrist, initial encounter    Note:  This document was prepared using Dragon voice recognition software and may include unintentional dictation errors.  Alona BeneJoshua Maleka Contino, MD Emergency Medicine    Brighten Orndoff, Arlyss RepressJoshua G, MD 04/01/19 1359

## 2019-04-01 NOTE — Discharge Instructions (Signed)
You were seen in the emergency department today with right wrist pain.  There is no obvious fracture.  There is an area that may be sprain in the wrist.  I have included the name for a local orthopedic/hand surgeon.  Please call tomorrow and set up a follow-up appointment.  We have placed you in a splint for comfort.  He may take Tylenol and/or Motrin for pain.  Return to the emergency department any new or suddenly worsening symptoms.

## 2019-04-01 NOTE — ED Notes (Signed)
Bed: WTR6 Expected date:  Expected time:  Means of arrival:  Comments: 

## 2019-04-23 ENCOUNTER — Telehealth: Payer: Self-pay | Admitting: Internal Medicine

## 2019-04-23 MED ORDER — BUDESONIDE-FORMOTEROL FUMARATE 160-4.5 MCG/ACT IN AERO: 2.0000 | INHALATION_SPRAY | Freq: Two times a day (BID) | RESPIRATORY_TRACT | 0 refills | Status: DC

## 2019-04-23 NOTE — Telephone Encounter (Signed)
Samples of symbicort placed up front for pt. Called and spoke with pt letting her know this had been done and that she can come pick them up. Pt verbalized understanding. Nothing further needed.

## 2019-05-07 ENCOUNTER — Ambulatory Visit (INDEPENDENT_AMBULATORY_CARE_PROVIDER_SITE_OTHER): Payer: Medicare Other | Admitting: Internal Medicine

## 2019-05-07 ENCOUNTER — Encounter: Payer: Self-pay | Admitting: Internal Medicine

## 2019-05-07 ENCOUNTER — Other Ambulatory Visit: Payer: Self-pay

## 2019-05-07 DIAGNOSIS — J4530 Mild persistent asthma, uncomplicated: Secondary | ICD-10-CM | POA: Diagnosis not present

## 2019-05-07 MED ORDER — BUDESONIDE-FORMOTEROL FUMARATE 160-4.5 MCG/ACT IN AERO: 2.0000 | INHALATION_SPRAY | Freq: Two times a day (BID) | RESPIRATORY_TRACT | 0 refills | Status: DC

## 2019-05-07 NOTE — Patient Instructions (Addendum)
Plan A = Automatic = symbicort 160 Take 2 puffs first thing in am and then another 2 puffs about 12 hours later.      Plan B = Backup Only use your albuterol inhaler as a rescue medication to be used if you can't catch your breath by resting or doing a relaxed purse lip breathing pattern.  - The less you use it, the better it will work when you need it. - Ok to use the inhaler up to 2 puffs  every 4 hours if you must but call for appointment if use goes up over your usual need - Don't leave home without it !!  (think of it like the spare tire for your car)   Plan C = Crisis - only use your albuterol nebulizer if you first try Plan B and it fails to help > ok to use the nebulizer up to every 4 hours but if start needing it regularly call for immediate appointment   Please schedule a follow up visit in 6 months but call sooner if needed

## 2019-05-07 NOTE — Progress Notes (Signed)
Subjective:     Patient ID: [name], female   DOB: February 13, 1986    MRN: [id number]  Brief patient profile:  50 yowf never smoker asthma since childhood never free of symptoms or need for inhaler and frequent prednisone rx x every few months maint by [facility] doctor on symbicort fall 2017 with no change pattern > referred to pulmonary clinic 03/17/2017 by EDP p 3rd ER trip in 6 months with one admit for asthma        History of Present Illness  03/17/2017 1st [facility] office visit/    Chief Complaint  Patient presents with  . Pulmonary Consult    Self referral. Pt states that she was dxed with Asthma at birth. She recently moved here from [facility]. She has been out of her Symbicort for the past month. She has been having increased SOB and cough with thick, yellow sputum recently.  She has been using albuterol neb and albuterol inhaler at least once per day.   just took last prednisone on day of ov Out of symb x sev weeks   But when on symb and pred only needs neb once a day  Has assoc nasal congestions and rattling cough worse at hs and in AM Baseline hfa very poor - see hfa  rec  Plan A = Automatic = symbicort 160 Take 2 puffs first thing in am and then another 2 puffs about 12 hours later.  Work on inhaler technique:   Plan B = Backup Only use your albuterol as a rescue medication       01/16/2018  f/u ov/ re: chronic asthma since childhood/ on disability due to asthma  maint on symb 160 Chief Complaint  Patient presents with  . Follow-up    f/u asthma, allergies, sinus problems causing breathing issues, using nebulizers more  baseline Dyspnea:  Not limited by breathing from desired activities   Cough: worse with pollen Sleep: 30 degrees, occ neb noct. Lots of noct nasal congestion SABA use:  Prior to 2-3 weeks prior to OV  Was not needing any saba now even needing neb rec Please remember to go to the lab department downstairs in the basement  for your tests - we will call you  with the results when they are available. Singulair(montelukast)  10 mg one daily  If not better prednisone x 6 days:   Prednisone 10 mg take  4 each am x 2 days,   2 each am x 2 days,  1 each am x 2 days and stop  Plan A = Automatic = symbicort 160 Take 2 puffs first thing in am and then another 2 puffs about 12 hours later.  Work on Clinical biochemist:   Plan B = Backup Only use your albuterol as a rescue medication Plan C = Crisis - only use your albuterol nebulizer if you first try Plan B and it fails to help > ok to use the nebulizer up to every 4 hours but if start needing it regularly call for immediate appointment     04/17/2018  f/u ov/ re: chronic mild asthma and rhintis with tendence to [place] Chief Complaint  Patient presents with  . Follow-up    Breathing has improved some. She is c/o nasal congestion and "sinus stuff".  She is using her albuterol inhaler 2 x per wk and neb with albuterol once per wk on average.   Dyspnea:  MMRC1 = can walk nl pace, flat grade, can't hurry  or go uphills or steps s sob   Cough: daytime attributes to pnds SABA use: as above, way too much  02: none Sleeping: 1 pillow rec Work on perfecting your inhaler technique:  relax and gently blow all the way out then take a nice smooth deep breath back in, triggering the inhaler at same time you start breathing in.  Hold for up to 5 seconds if you can. Blow out thru nose. Rinse and gargle with water [facility] your nasal symptoms Zyrtec is the best over the counter for nasal congestion as needed Please remember to go to the lab department downstairs in the basement  for your tests - we will call you with the results when they are available. Please schedule a follow up visit in 3 months but call sooner if needed  - consider sinus ct now    07/30/2018  f/u ov/ re: chronic asthma/ no better on singulair and neither were the nasal symptoms so d/c'd  singulair  Chief Complaint  Patient presents with  . Follow-up    Breathing is overall doing well. She does c/o sinus and nasal congestion.  She uses her albuterol inhaler 1-2 x per wk and neb once per wk on average.   Dyspnea:  Not limited by breathing from desired activities   Cough: none Sleeping: ok flat  SABA use: a couple time a week  rec No change in medications Work on inhaler technique:   schedule sinus CT > ok x prominent concha bullosa    11/06/2018  f/u ov/ re:  Chronic asthma no change on vs off singulair, nasal steroids cause bleeding/ lots of nasal congestion Chief Complaint  Patient presents with  . Follow-up    sinus congestion, little cough with clear mucus, SOB with outside activity,  Dyspnea:  Not limited by breathing from desired activities  / worse outdoor Cough: minimal / outdoors mostly  Sleeping:  Flat bed / a bunch of pillows SABA use: once or twice a week rec Work on inhaler technique:    Please see patient coordinator before you leave today  to schedule ent referral > marcellino eval 11/20/18 rec flonase/ns irrigation and allegra > helps some       02/06/2019  f/u ov/ re: chronic asthma Chief Complaint  Patient presents with  . Follow-up    f/u asthma   Dyspnea:  Does ok at nl pace some hills/steps do make her sob   Cough: minimal with pollen exp moslty  Sleeping: bed is flat / 2 pillows  SABA use: while on symbicort 160 2bid needs once a week saba  02: none rec Plan A = Automatic = symbicort 160 x 2 each am and then the pm dose optional  Plan B = Backup Only use your albuterol inhaler as a rescue medication   Plan C = Crisis - only use your albuterol nebulizer if you first try Plan B and it fails to help > ok to use the nebulizer up to every 4 hours but if start needing it regularly call for immediate appointment    05/07/2019  f/u ov/ re:  Chief Complaint  Patient presents with  . Follow-up    dry cough for the past 2 days.  She is having increased SOB on hot days. She is using the albuterol inhaler at least every other day and neb about 3 x per wk.  Dyspnea:  MMRC1 = can walk nl pace, flat grade, can't hurry or  go uphills or steps s sob   Cough: some mostly daytime, min prod Sleeping: bed is flat, 2 pillows  SABA use: as above - overusing when "over does it"  02: none    No obvious day to day or daytime variability or assoc excess/ purulent sputum or mucus plugs or hemoptysis or cp or chest tightness, subjective wheeze or overt sinus or hb symptoms.   sleeping  without nocturnal  or early am exacerbation  of respiratory  c/o's or need for noct saba. Also denies any obvious fluctuation of symptoms with weather or environmental changes or other aggravating or alleviating factors except as outlined above   No unusual exposure hx or h/o childhood pna  or knowledge of premature birth.  Current Allergies, Complete Past Medical History, Past Surgical History, Family History, and Social History were reviewed in [facility] electronic medical record.  ROS  The following are not active complaints unless bolded Hoarseness, sore throat, dysphagia, dental problems, itching, sneezing,  nasal congestion or discharge of excess mucus or purulent secretions, ear ache,   fever, chills, sweats, unintended wt loss or wt gain, classically pleuritic or exertional cp,  orthopnea pnd or arm/hand swelling  or leg swelling, presyncope, palpitations, abdominal pain, anorexia, nausea, vomiting, diarrhea  or change in bowel habits or change in bladder habits, change in stools or change in urine, dysuria, hematuria,  rash, arthralgias, visual complaints, headache, numbness, weakness or ataxia or problems with walking or coordination,  change in mood or  memory.        Current Meds  Medication Sig  . albuterol (PROVENTIL HFA;VENTOLIN HFA) 108 (90 Base) MCG/ACT inhaler Inhale 2 puffs into the lungs every 4 (four) hours as needed for wheezing  or shortness of breath.  Marland Kitchen. albuterol (PROVENTIL) (2.5 MG/3ML) 0.083% nebulizer solution Take 3 mLs (2.5 mg total) by nebulization every 6 (six) hours as needed for wheezing or shortness of breath.  . budesonide-formoterol (SYMBICORT) 160-4.5 MCG/ACT inhaler Inhale 2 puffs into the lungs 2 (two) times daily.  . pseudoephedrine-acetaminophen (TYLENOL SINUS) 30-500 MG TABS tablet Take 1 tablet by mouth every 4 (four) hours as needed (congestion).                         Objective:   Physical Exam   amb wf nad    05/07/2019         152 02/06/2019        145  11/06/2018        148  07/30/2018      143  04/17/2018          145  01/16/2018          150  04/17/2017          150    03/17/17 150 lb (68 kg)  03/07/17 150 lb 4 oz (68.2 kg)  10/09/16 162 lb 1.6 oz (73.5 kg)      Vital signs reviewed - Note on arrival 02 sats  100% on RA      HEENT: poor dentition,nl  turbinates bilaterally, and oropharynx. Nl external ear canals without cough reflex   NECK :  without JVD/Nodes/TM/ nl carotid upstrokes bilaterally   LUNGS: no acc muscle use,  Nl contour chest which is clear to A and P bilaterally without cough on insp or exp maneuvers   CV:  RRR  no s3 or murmur or increase in P2, and no edema   ABD:  soft and  nontender with nl inspiratory excursion in the supine position. No bruits or organomegaly appreciated, bowel sounds nl  MS:  Nl gait/ ext warm without deformities, calf tenderness, cyanosis or clubbing No obvious joint restrictions   SKIN: warm and dry without lesions    NEURO:  alert, approp, nl sensorium with  no motor or cerebellar deficits apparent.          Assessment:

## 2019-05-07 NOTE — Assessment & Plan Note (Signed)
Onset in childhood FENO 03/17/2017  =   30 on final day of prednisone - Spirometry 03/17/2017  wnl s curvature on last day of pred p saba 5 h prior  - 03/17/2017   Start symb 160 2bid - 01/16/2018  After extensive coaching inhaler device  effectiveness =    75% (short Ti)   - FENO 01/16/2018  =   62 on symb 160 2bid  - Spirometry 01/16/2018  wnl with  Min curvature  - 01/16/2018 added singulair - Allergy profile 04/17/2018 >  Eos 0.2 /  IgE  128 RAST pos cat > dog, dust and oak trees - FENO 04/17/2018  =  30 p  symb 160/ singulair  - 06/2018 did not refill singulair as no better   - 02/06/2019  After extensive coaching inhaler device,  effectiveness =    90% with hfa   Overusing saba at this point o/w good control of her asthma   I spent extra time with pt today reviewing appropriate use of albuterol for prn use on exertion with the following points: 1) saba is for relief of sob that does not improve by walking a slower pace or resting but rather if the pt does not improve after trying this first. 2) If the pt is convinced, as many are, that saba helps recover from activity faster then it's easy to tell if this is the case by re-challenging : ie stop, take the inhaler, then p 5 minutes try the exact same activity (intensity of workload) that just caused the symptoms and see if they are substantially diminished or not after saba 3) if there is an activity that reproducibly causes the symptoms, try the saba 15 min before the activity on alternate days   If in fact the saba really does help, then fine to continue to use it prn but advised may need to look closer at the maintenance regimen being used to achieve better control of airways disease with exertion.    No change rx needed o/w    F/u q 6 m reasonable   I had an extended discussion with the patient reviewing all relevant studies completed to date and  lasting 15 to 20 minutes of a 25 minute visit    Each maintenance medication was reviewed in detail  including most importantly the difference between maintenance and prns and under what circumstances the prns are to be triggered using an action plan format that is not reflected in the computer generated alphabetically organized AVS.     Please see AVS for specific instructions unique to this visit that I personally wrote and verbalized to the the pt in detail and then reviewed with pt  by my nurse highlighting any  changes in therapy recommended at today's visit to their plan of care.

## 2019-05-29 ENCOUNTER — Other Ambulatory Visit: Payer: Self-pay | Admitting: Internal Medicine

## 2019-07-12 ENCOUNTER — Telehealth: Payer: Self-pay | Admitting: Internal Medicine

## 2019-07-12 DIAGNOSIS — J4530 Mild persistent asthma, uncomplicated: Secondary | ICD-10-CM

## 2019-07-12 NOTE — Telephone Encounter (Signed)
Called spoke with patient, she states Dr. Melvyn Novas has been doing her nebulizer medications but she had a nebulizer from before she moved here. She states she needs a new machine now. She needs to faxed to  [facility] for a new machine.   Patient said order had to be faxed. As it is 5pm Dr. Melvyn Novas is not in office. Will route to him for Monday. Called [employer] for their fax number. . Fax number is [phone].  Dr. Melvyn Novas please advise.

## 2019-07-13 NOTE — Telephone Encounter (Signed)
Fine with me

## 2019-07-15 NOTE — Telephone Encounter (Signed)
Left detailed message for patient advising that I would place and fax the order today.   Nothing further needed.

## 2019-11-07 ENCOUNTER — Ambulatory Visit: Payer: Medicare Other | Admitting: Internal Medicine

## 2019-11-08 ENCOUNTER — Ambulatory Visit: Payer: Medicare Other | Admitting: Internal Medicine

## 2019-11-16 ENCOUNTER — Other Ambulatory Visit: Payer: Self-pay | Admitting: Internal Medicine

## 2019-11-21 ENCOUNTER — Ambulatory Visit (INDEPENDENT_AMBULATORY_CARE_PROVIDER_SITE_OTHER): Payer: Medicare Other | Admitting: Internal Medicine

## 2019-11-21 ENCOUNTER — Encounter: Payer: Self-pay | Admitting: Internal Medicine

## 2019-11-21 ENCOUNTER — Other Ambulatory Visit: Payer: Self-pay

## 2019-11-21 DIAGNOSIS — J4530 Mild persistent asthma, uncomplicated: Secondary | ICD-10-CM

## 2019-11-21 DIAGNOSIS — J310 Chronic rhinitis: Secondary | ICD-10-CM

## 2019-11-21 MED ORDER — AZELASTINE-FLUTICASONE 137-50 MCG/ACT NA SUSP: 1.0000 | Freq: Two times a day (BID) | NASAL | 11 refills | Status: DC

## 2019-11-21 MED ORDER — ALBUTEROL SULFATE (2.5 MG/3ML) 0.083% IN NEBU: 2.5000 mg | INHALATION_SOLUTION | RESPIRATORY_TRACT | 1 refills | Status: DC | PRN

## 2019-11-21 MED ORDER — ALBUTEROL SULFATE HFA 108 (90 BASE) MCG/ACT IN AERS: 2.0000 | INHALATION_SPRAY | RESPIRATORY_TRACT | 2 refills | Status: DC | PRN

## 2019-11-21 NOTE — Patient Instructions (Addendum)
Try dymista one twice daily aimed toward ear on same side and if improve reduce to one at bedtime   No other changes. If nose not better let me refer you to an allergist    Please schedule a follow up visit in 6 months but call sooner if needed

## 2019-11-21 NOTE — Progress Notes (Signed)
Subjective:     Patient ID: Lori Black, female   DOB: [birth year]    MRN: [id number]  Brief patient profile:  1 yowf never smoker asthma since childhood never free of symptoms or need for inhaler and frequent prednisone rx x every few months maint by [facility] doctor on symbicort fall 2017 with no change pattern > referred to pulmonary clinic 03/17/2017 by EDP p 3rd ER trip in 6 months with one admit for asthma        History of Present Illness  03/17/2017 1st [facility] office visit/ Lori Black   Chief Complaint  Patient presents with  . Pulmonary Consult    Self referral. Pt states that she was dxed with Asthma at birth. She recently moved here from [facility]. She has been out of her Symbicort for the past month. She has been having increased SOB and cough with thick, yellow sputum recently.  She has been using albuterol neb and albuterol inhaler at least once per day.   just took last prednisone on day of ov Out of symb x sev weeks   But when on symb and pred only needs neb once a day  Has assoc nasal congestions and rattling cough worse at hs and in AM Baseline hfa very poor - see hfa  rec  Plan A = Automatic = symbicort 160 Take 2 puffs first thing in am and then another 2 puffs about 12 hours later.  Work on inhaler technique:   Plan B = Backup Only use your albuterol as a rescue medication       01/16/2018  f/u ov/Lori Black re: chronic asthma since childhood/ on disability due to asthma  maint on symb 160 Chief Complaint  Patient presents with  . Follow-up    f/u asthma, allergies, sinus problems causing breathing issues, using nebulizers more  baseline Dyspnea:  Not limited by breathing from desired activities   Cough: worse with pollen Sleep: 30 degrees, occ neb noct. Lots of noct nasal congestion SABA use:  Prior to 2-3 weeks prior to OV  Was not needing any saba now even needing neb rec Please remember to go to the lab department downstairs in the basement  for your tests - we will call you  with the results when they are available. Singulair(montelukast)  10 mg one daily  If not better prednisone x 6 days:   Prednisone 10 mg take  4 each am x 2 days,   2 each am x 2 days,  1 each am x 2 days and stop  Plan A = Automatic = symbicort 160 Take 2 puffs first thing in am and then another 2 puffs about 12 hours later.  Work on Clinical biochemist:   Plan B = Backup Only use your albuterol as a rescue medication Plan C = Crisis - only use your albuterol nebulizer if you first try Plan B and it fails to help > ok to use the nebulizer up to every 4 hours but if start needing it regularly call for immediate appointment     04/17/2018  f/u ov/Lori Black re: chronic mild asthma and rhintis with tendence to [place] Chief Complaint  Patient presents with  . Follow-up    Breathing has improved some. She is c/o nasal congestion and "sinus stuff".  She is using her albuterol inhaler 2 x per wk and neb with albuterol once per wk on average.   Dyspnea:  MMRC1 = can walk nl pace, flat grade, can't hurry  or go uphills or steps s sob   Cough: daytime attributes to pnds SABA use: as above, way too much  02: none Sleeping: 1 pillow rec Work on perfecting your inhaler technique:  relax and gently blow all the way out then take a nice smooth deep breath back in, triggering the inhaler at same time you start breathing in.  Hold for up to 5 seconds if you can. Blow out thru nose. Rinse and gargle with water when done [facility] to take prednisone for your nasal symptoms Zyrtec is the best over the counter for nasal congestion as needed Please remember to go to the lab department downstairs in the basement  for your tests - we will call you with the results when they are available. Please schedule a follow up visit in 3 months but call sooner if needed  - consider sinus ct now    07/30/2018  f/u ov/Lori Black re: chronic asthma/ no better on singulair and neither were the nasal symptoms so d/c'd  singulair  Chief Complaint  Patient presents with  . Follow-up    Breathing is overall doing well. She does c/o sinus and nasal congestion.  She uses her albuterol inhaler 1-2 x per wk and neb once per wk on average.   Dyspnea:  Not limited by breathing from desired activities   Cough: none Sleeping: ok flat  SABA use: a couple time a week  rec No change in medications Work on inhaler technique:   schedule sinus CT > ok x prominent concha bullosa    11/06/2018  f/u ov/Lori Black re:  Chronic asthma no change on vs off singulair, nasal steroids cause bleeding/ lots of nasal congestion Chief Complaint  Patient presents with  . Follow-up    sinus congestion, little cough with clear mucus, SOB with outside activity,  Dyspnea:  Not limited by breathing from desired activities  / worse outdoor Cough: minimal / outdoors mostly  Sleeping:  Flat bed / a bunch of pillows SABA use: once or twice a week rec Work on inhaler technique:    Please see patient coordinator before you leave today  to schedule ent referral > marcellino eval 11/20/18 rec flonase/ns irrigation and allegra > helps some       02/06/2019  f/u ov/Lori Black re: chronic asthma Chief Complaint  Patient presents with  . Follow-up    f/u asthma   Dyspnea:  Does ok at nl pace some hills/steps do make her sob   Cough: minimal with pollen exp moslty  Sleeping: bed is flat / 2 pillows  SABA use: while on symbicort 160 2bid needs once a week saba  02: none rec Plan A = Automatic = symbicort 160 x 2 each am and then the pm dose optional  Plan B = Backup Only use your albuterol inhaler as a rescue medication   Plan C = Crisis - only use your albuterol nebulizer if you first try Plan B and it fails to help > ok to use the nebulizer up to every 4 hours but if start needing it regularly call for immediate appointment    05/07/2019  f/u ov/Lori Black re:  Chronic asthma  Chief Complaint  Patient presents with  . Follow-up    dry cough for  the past 2 days. She is having increased SOB on hot days. She is using the albuterol inhaler at least every other day and neb about 3 x per wk.  Dyspnea:  MMRC1 = can walk nl pace, flat grade,  can't hurry or go uphills or steps s sob   Cough: some mostly daytime, min prod Sleeping: bed is flat, 2 pillows  SABA use: as above - overusing when "over does it"  02: none  rec Plan A = Automatic = symbicort 160 Take 2 puffs first thing in am and then another 2 puffs about 12 hours later.  Plan B = Backup Only use your albuterol inhaler as a rescue medication   Plan C = Crisis - only use your albuterol nebulizer if you first try Plan B      11/21/2019  f/u ov/Lori Black re: chronic asthma  / rhinitis with ent eval rec flonase but not using due to nose bleeds Chief Complaint  Patient presents with  . Follow-up    Breathing is overall doing well. She states continues to have issues with sinus congestion. She is using her albuterol inhaler and neb both a few x per month.   Dyspnea:  MMRC1 = can walk nl pace, flat grade, can't hurry or go uphills or steps s sob   Cough: minimal Sleeping: bed is flat  2 pllows  SABA use: much less now maint on symb 160 2bid consistently  02: none    No obvious day to day or daytime variability or assoc excess/ purulent sputum or mucus plugs or hemoptysis or cp or chest tightness, subjective wheeze or overt   hb symptoms.   Sleeping as above  without nocturnal  or early am exacerbation  of respiratory  c/o's or need for noct saba. Also denies any obvious fluctuation of symptoms with weather or environmental changes or other aggravating or alleviating factors except as outlined above   No unusual exposure hx or h/o childhood pna  or knowledge of premature birth.  Current Allergies, Complete Past Medical History, Past Surgical History, Family History, and Social History were reviewed in [employer] record.  ROS  The following are not active  complaints unless bolded Hoarseness, sore throat, dysphagia, dental problems, itching, sneezing,  nasal congestion or discharge of excess mucus or purulent secretions, ear ache,   fever, chills, sweats, unintended wt loss or wt gain, classically pleuritic or exertional cp,  orthopnea pnd or arm/hand swelling  or leg swelling, presyncope, palpitations, abdominal pain, anorexia, nausea, vomiting, diarrhea  or change in bowel habits or change in bladder habits, change in stools or change in urine, dysuria, hematuria,  rash, arthralgias, visual complaints, headache, numbness, weakness or ataxia or problems with walking or coordination,  change in mood or  memory.        Current Meds  Medication Sig  . albuterol (PROVENTIL) (2.5 MG/3ML) 0.083% nebulizer solution USE 1 VIAL IN NEBULIZER EVERY 6 HOURS AS NEEDED FOR WHEEZING OR SHORTNESS OF BREATH  . albuterol (VENTOLIN HFA) 108 (90 Base) MCG/ACT inhaler INHALE 2 PUFFS BY MOUTH EVERY 4 HOURS AS NEEDED FOR WHEEZING FOR SHORTNESS OF BREATH  . budesonide-formoterol (SYMBICORT) 160-4.5 MCG/ACT inhaler Inhale 2 puffs into the lungs 2 (two) times daily.  . pseudoephedrine-acetaminophen (TYLENOL SINUS) 30-500 MG TABS tablet Take 1 tablet by mouth every 4 (four) hours as needed (congestion).                     Objective:   Physical Exam   amb wf nad     11/21/2019            153  05/07/2019         152 02/06/2019  145  11/06/2018        148  07/30/2018      143  04/17/2018          145  01/16/2018          150  04/17/2017          150    03/17/17 150 lb (68 kg)  03/07/17 150 lb 4 oz (68.2 kg)  10/09/16 162 lb 1.6 oz (73.5 kg)    Vital signs reviewed  11/21/2019  - Note at rest 02 sats  99% on RA     HEENT : pt wearing mask not removed for exam due to covid -19 concerns.    NECK :  without JVD/Nodes/TM/ nl carotid upstrokes bilaterally   LUNGS: no acc muscle use,  Nl contour chest which is clear to A and P bilaterally without cough on insp or  exp maneuvers   CV:  RRR  no s3 or murmur or increase in P2, and no edema   ABD:  soft and nontender with nl inspiratory excursion in the supine position. No bruits or organomegaly appreciated, bowel sounds nl  MS:  Nl gait/ ext warm without deformities, calf tenderness, cyanosis or clubbing No obvious joint restrictions   SKIN: warm and dry without lesions    NEURO:  alert, approp, nl sensorium with  no motor or cerebellar deficits apparent.             Assessment:

## 2019-11-22 ENCOUNTER — Encounter: Payer: Self-pay | Admitting: Internal Medicine

## 2019-11-22 NOTE — Assessment & Plan Note (Signed)
Onset in childhood - Allergy profile 04/17/2018 >  Eos 0.2 /  IgE  128 RAST pos cat > dog, dust and oak trees No response to singulair as of 06/2018 so d/c'd - Sinus CT  08/07/2018 1. Mild patchy mucosal thickening. 2. Prominent bilateral concha bullosa.> referred to ent as no cat/dog and symptoms are year round /worse outside and not suggestive of an allergic mech   - referred to ent>>>  [name] eval 11/20/18 rec flonase/ns irrigation and allegra > helps some  - 11/21/2019 rec trial of dymista one bid   Advised on approp technique for application of nasal sprays using a teach back technique.  If she does not respond the neck step would be referred to allergy, advised..    Medical decision making was a moderate level of complexity in this case because of  two chronic conditions /diagnoses requiring extra time for  H and P, chart review, counseling, performed device teaching  using a teach back technique which also  extended face to face time for this visit (see above)  and generating customized AVS unique to this office visit and charting.   Each maintenance medication was reviewed in detail including emphasizing most importantly the difference between maintenance and prns and under what circumstances the prns are to be triggered using an action plan format where appropriate. Please see avs for details which were reviewed in writing by both me and my nurse and patient given a written copy highlighted where appropriate with yellow highlighter for the patient's continued care at home along with an updated version of their medications.  Patient was asked to maintain medication reconciliation by comparing this list to the actual medications being used at home and to contact this office right away if there is a conflict or discrepancy.

## 2019-11-22 NOTE — Assessment & Plan Note (Signed)
Onset in childhood FENO 03/17/2017  =   30 on final day of prednisone - Spirometry 03/17/2017  wnl s curvature on last day of pred p saba 5 h prior  - 03/17/2017   Start symb 160 2bid - 01/16/2018  After extensive coaching inhaler device  effectiveness =    75% (short Ti)   - FENO 01/16/2018  =   62 on symb 160 2bid  - Spirometry 01/16/2018  wnl with  Min curvature  - 01/16/2018 added singulair - Allergy profile 04/17/2018 >  Eos 0.2 /  IgE  128 RAST pos cat > dog, dust and oak trees - FENO 04/17/2018  =  30 p  symb 160/ singulair  - 06/2018 did not refill singulair as no better   - 02/06/2019  After extensive coaching inhaler device,  effectiveness =    90% with hfa   All goals of chronic asthma control met including optimal function and elimination of symptoms with minimal need for rescue therapy.  Contingencies discussed in full including contacting this office immediately if not controlling the symptoms using the rule of two's.  '   No change rx needed  Pt informed of the seriousness of COVID 19 infection as a direct risk to lung health  and safey and to close contacts and should continue to wear a facemask in public and minimize exposure to public locations but especially avoid any area or activity where non-close contacts are not observing distancing or wearing an appropriate face mask.  I strongly recommended vaccine when offered.

## 2020-03-22 ENCOUNTER — Encounter (HOSPITAL_COMMUNITY): Payer: Self-pay | Admitting: Emergency Medicine

## 2020-03-22 ENCOUNTER — Other Ambulatory Visit: Payer: Self-pay

## 2020-03-22 ENCOUNTER — Emergency Department (HOSPITAL_COMMUNITY)
Admission: EM | Admit: 2020-03-22 | Discharge: 2020-03-22 | Disposition: A | Discharge status: Home / Self Care | Payer: Medicare Other | Attending: Emergency Medicine | Admitting: Emergency Medicine

## 2020-03-22 ENCOUNTER — Emergency Department (HOSPITAL_COMMUNITY): Payer: Medicare Other

## 2020-03-22 DIAGNOSIS — Y999 Unspecified external cause status: Secondary | ICD-10-CM | POA: Insufficient documentation

## 2020-03-22 DIAGNOSIS — W19XXXA Unspecified fall, initial encounter: Secondary | ICD-10-CM | POA: Diagnosis not present

## 2020-03-22 DIAGNOSIS — Y92012 Bathroom of single-family (private) house as the place of occurrence of the external cause: Secondary | ICD-10-CM | POA: Diagnosis not present

## 2020-03-22 DIAGNOSIS — J45909 Unspecified asthma, uncomplicated: Secondary | ICD-10-CM | POA: Diagnosis not present

## 2020-03-22 DIAGNOSIS — Z7951 Long term (current) use of inhaled steroids: Secondary | ICD-10-CM | POA: Diagnosis not present

## 2020-03-22 DIAGNOSIS — Y93E1 Activity, personal bathing and showering: Secondary | ICD-10-CM | POA: Diagnosis not present

## 2020-03-22 DIAGNOSIS — S63501A Unspecified sprain of right wrist, initial encounter: Secondary | ICD-10-CM | POA: Insufficient documentation

## 2020-03-22 DIAGNOSIS — S6991XA Unspecified injury of right wrist, hand and finger(s), initial encounter: Secondary | ICD-10-CM | POA: Diagnosis not present

## 2020-03-22 MED ORDER — IBUPROFEN 200 MG PO TABS
400.0000 mg | ORAL_TABLET | Freq: Once | ORAL | Status: AC
  Administered 2020-03-22: 400 mg via ORAL
  Filled 2020-03-22: qty 2

## 2020-03-22 NOTE — ED Provider Notes (Signed)
[facility]-EMERGENCY DEPT Provider Note   CSN: [id number] Arrival date & time: 03/22/20  0128     History Chief Complaint  Patient presents with  . Wrist Pain    Lori Black is a 34 y.o. female.  The history is provided by the patient.  Wrist Pain This is a new problem. The problem occurs constantly. The problem has been gradually worsening. Exacerbated by: movement. The symptoms are relieved by rest.  Patient reports several days ago she fell landing on an outstretched hand.  She had pain at that time, but the pain resolved.  Tonight when she was getting out of the bathtub she felt a pop in the right wrist.  No new trauma tonight.  She reports mild tingling in her fingers.  She reports she injured her wrist last year as well      Past Medical History:  Diagnosis Date  . Asthma     Patient Active Problem List   Diagnosis Date Noted  . Rhinitis, chronic 07/31/2018  . Chest pain, musculoskeletal 07/19/2017  . Chronic asthma, mild persistent, uncomplicated 04/17/2017  . Asthma with acute exacerbation 10/11/2016  . Microcytic anemia 10/11/2016  . Influenza A 10/11/2016  . Acute respiratory failure (HCC) 10/09/2016    Past Surgical History:  Procedure Laterality Date  . CESAREAN SECTION       OB History   No obstetric history on file.     No family history on file.  Social History   Tobacco Use  . Smoking status: Never Smoker  . Smokeless tobacco: Never Used  Substance Use Topics  . Alcohol use: Yes    Comment: occassionally  . Drug use: No    Home Medications Prior to Admission medications   Medication Sig Start Date End Date Taking? Authorizing Provider  albuterol (PROVENTIL) (2.5 MG/3ML) 0.083% nebulizer solution Take 3 mLs (2.5 mg total) by nebulization every 4 (four) hours as needed for wheezing or shortness of breath. 11/21/19   Nyoka Cowden, MD  albuterol (VENTOLIN HFA) 108 (90 Base) MCG/ACT inhaler Inhale 2 puffs into the  lungs every 4 (four) hours as needed for wheezing or shortness of breath. 11/21/19   Nyoka Cowden, MD  Azelastine-Fluticasone (DYMISTA) 137-50 MCG/ACT SUSP Place 1 puff into the nose 2 (two) times daily. 11/21/19   Nyoka Cowden, MD  budesonide-formoterol (SYMBICORT) 160-4.5 MCG/ACT inhaler Inhale 2 puffs into the lungs 2 (two) times daily. 05/07/19   Nyoka Cowden, MD  pseudoephedrine-acetaminophen (TYLENOL SINUS) 30-500 MG TABS tablet Take 1 tablet by mouth every 4 (four) hours as needed (congestion).     [provider]    Allergies    Patient has no known allergies.  Review of Systems   Review of Systems  Musculoskeletal: Positive for arthralgias.  Neurological: Positive for numbness.    Physical Exam Updated Vital Signs BP (!) 151/91 (BP Location: Left Arm)   Pulse 94   Temp 98.6 F (37 C) (Oral)   Resp 16   Ht 1.676 m (5\' 6" )   Wt 68 kg   SpO2 100%   BMI 24.21 kg/m   Physical Exam CONSTITUTIONAL: Well developed/well nourished HEAD: Normocephalic/atraumatic EYES: EOMI ENMT: Mucous membranes moist NECK: supple no meningeal signs CV: S1/S2 noted, no murmurs/rubs/gallops noted LUNGS: Lungs are clear to auscultation bilaterally  ABDOMEN: soft NEURO: Pt is awake/alert/appropriate, moves all extremitiesx4.  No facial droop.  Appropriate handgrip noted in right hand EXTREMITIES: pulses normal/equal, full ROM, mild tenderness to dorsal  aspect of right wrist.  No deformity.  No snuffbox tenderness.  Full range of motion of right wrist.  Distal pulses intact. SKIN: warm, color normal PSYCH: no abnormalities of mood noted, alert and oriented to situation  ED Results / Procedures / Treatments   Labs (all labs ordered are listed, but only abnormal results are displayed) Labs Reviewed - No data to display  EKG None  Radiology DG Wrist Complete Right  Result Date: 03/22/2020 CLINICAL DATA:  Wrist injury EXAM: RIGHT WRIST - COMPLETE 3+ VIEW COMPARISON:  Radiograph  04/01/2019 FINDINGS: No acute bony abnormality. Specifically, no fracture, subluxation, or dislocation. Chronic deformity of the ulnar styloid process with adjacent increased attenuation of the triangular fibrocartilage may reflect sequela of prior avulsion or bony erosion such as from an underlying arthropathy (gout or rheumatoid). Remaining osseous structures are unremarkable. Intraosseous ganglion cyst noted about the waist of the scaphoid, similar to comparison. No significant soft tissue swelling. IMPRESSION: 1. No acute bony abnormality. 2. Chronic deformity of the ulnar styloid process with adjacent increased attenuation of the triangular fibrocartilage may reflect sequela of prior avulsion or bony erosion such as from an underlying arthropathy (gout or rheumatoid). Electronically Signed   By: Lovena Le M.D.   On: 03/22/2020 02:53    Procedures Procedures   Medications Ordered in ED Medications  ibuprofen (ADVIL) tablet 400 mg (has no administration in time range)    ED Course  I have reviewed the triage vital signs and the nursing notes.  Pertinent  imaging results that were available during my care of the patient were reviewed by me and considered in my medical decision making (see chart for details).    MDM Rules/Calculators/A&P                      Patient with right wrist pain.  No acute fractures, she may have had previous injury.  Will place in removable splint and referred to hand.  No snuffbox tenderness to suggest occult scaphoid injury Final Clinical Impression(s) / ED Diagnoses Final diagnoses:  Sprain of right wrist, initial encounter    Rx / DC Orders ED Discharge Orders    None       Ripley Fraise, MD 03/22/20 [phone]

## 2020-03-22 NOTE — ED Triage Notes (Signed)
Patient states she lifted herself out of the tub when her wrist popped and gave out. Patient states she is now having tingling in her fingers when her wrist is in a certain position. Patient states she injured her wrist last year also.

## 2020-04-08 DIAGNOSIS — S63501A Unspecified sprain of right wrist, initial encounter: Secondary | ICD-10-CM | POA: Diagnosis not present

## 2020-04-29 DIAGNOSIS — S63501D Unspecified sprain of right wrist, subsequent encounter: Secondary | ICD-10-CM | POA: Diagnosis not present

## 2020-05-20 ENCOUNTER — Ambulatory Visit: Payer: Medicare Other | Admitting: Internal Medicine

## 2020-05-27 DIAGNOSIS — S63093A Other subluxation of unspecified wrist and hand, initial encounter: Secondary | ICD-10-CM | POA: Diagnosis not present

## 2020-06-17 DIAGNOSIS — S56511A Strain of other extensor muscle, fascia and tendon at forearm level, right arm, initial encounter: Secondary | ICD-10-CM | POA: Diagnosis not present

## 2020-07-07 ENCOUNTER — Other Ambulatory Visit: Payer: Self-pay | Admitting: Orthopedic Surgery

## 2020-07-07 DIAGNOSIS — S63093A Other subluxation of unspecified wrist and hand, initial encounter: Secondary | ICD-10-CM | POA: Diagnosis not present

## 2020-07-07 DIAGNOSIS — S63591D Other specified sprain of right wrist, subsequent encounter: Secondary | ICD-10-CM | POA: Diagnosis not present

## 2020-07-09 ENCOUNTER — Encounter (HOSPITAL_BASED_OUTPATIENT_CLINIC_OR_DEPARTMENT_OTHER): Payer: Self-pay | Admitting: Orthopedic Surgery

## 2020-07-09 ENCOUNTER — Other Ambulatory Visit: Payer: Self-pay

## 2020-07-13 ENCOUNTER — Other Ambulatory Visit (HOSPITAL_COMMUNITY)
Admission: RE | Admit: 2020-07-13 | Discharge: 2020-07-13 | Disposition: A | Discharge status: Home / Self Care | Payer: Medicare Other | Source: Ambulatory Visit | Attending: Orthopedic Surgery | Admitting: Orthopedic Surgery

## 2020-07-13 DIAGNOSIS — Z01812 Encounter for preprocedural laboratory examination: Secondary | ICD-10-CM | POA: Insufficient documentation

## 2020-07-13 DIAGNOSIS — Z20822 Contact with and (suspected) exposure to covid-19: Secondary | ICD-10-CM | POA: Insufficient documentation

## 2020-07-13 LAB — SARS CORONAVIRUS 2 (TAT 6-24 HRS): SARS Coronavirus 2: NEGATIVE

## 2020-07-14 ENCOUNTER — Ambulatory Visit (INDEPENDENT_AMBULATORY_CARE_PROVIDER_SITE_OTHER): Payer: Medicare Other | Admitting: Internal Medicine

## 2020-07-14 ENCOUNTER — Other Ambulatory Visit: Payer: Self-pay

## 2020-07-14 ENCOUNTER — Encounter: Payer: Self-pay | Admitting: Internal Medicine

## 2020-07-14 DIAGNOSIS — J4530 Mild persistent asthma, uncomplicated: Secondary | ICD-10-CM | POA: Diagnosis not present

## 2020-07-14 DIAGNOSIS — J310 Chronic rhinitis: Secondary | ICD-10-CM | POA: Diagnosis not present

## 2020-07-14 MED ORDER — BREZTRI AEROSPHERE 160-9-4.8 MCG/ACT IN AERO: 2.0000 | INHALATION_SPRAY | Freq: Two times a day (BID) | RESPIRATORY_TRACT | 0 refills | Status: DC

## 2020-07-14 NOTE — Progress Notes (Signed)

## 2020-07-14 NOTE — Assessment & Plan Note (Addendum)
Onset in childhood FENO 03/17/2017  =   30 on final day of prednisone - Spirometry 03/17/2017  wnl s curvature on last day of pred p saba 5 h prior  - 03/17/2017   Start symb 160 2bid - 01/16/2018  After extensive coaching inhaler device  effectiveness =    75% (short Ti)   - FENO 01/16/2018  =   62 on symb 160 2bid  - Spirometry 01/16/2018  wnl with  Min curvature  - 01/16/2018 added singulair - Allergy profile 04/17/2018 >  Eos 0.2 /  IgE  128 RAST pos cat > dog, dust and oak trees - FENO 04/17/2018  =  30 p  symb 160/ singulair  - 06/2018 did not refill singulair as no better   - 02/06/2019  After extensive coaching inhaler device,  effectiveness =    90% with hfa  - 07/14/2020 referred to allergy for pnds  With asthma well controlled  All goals of chronic asthma control met including optimal function and elimination of symptoms with minimal need for rescue therapy on symbicort 160 2bid - can substitute breztri one bid until new samples arrive from [facility] for symbicort. 160   Contingencies discussed in full including contacting this office immediately if not controlling the symptoms using the rule of two's.     Pulmonary f/u can be prn once establishes with allergy

## 2020-07-14 NOTE — Progress Notes (Signed)
Subjective:     Patient ID: Lori Black, female   DOB: [birth year]    MRN: [id number]  Brief patient profile:  1 yowf never smoker asthma since childhood never free of symptoms or need for inhaler and frequent prednisone rx x every few months maint by [facility] doctor on symbicort fall 2017 with no change pattern > referred to pulmonary clinic 03/17/2017 by EDP p 3rd ER trip in 6 months with one admit for asthma        History of Present Illness  03/17/2017 1st [facility] office visit/ Lori Black   Chief Complaint  Patient presents with  . Pulmonary Consult    Self referral. Pt states that she was dxed with Asthma at birth. She recently moved here from [facility]. She has been out of her Symbicort for the past month. She has been having increased SOB and cough with thick, yellow sputum recently.  She has been using albuterol neb and albuterol inhaler at least once per day.   just took last prednisone on day of ov Out of symb x sev weeks   But when on symb and pred only needs neb once a day  Has assoc nasal congestions and rattling cough worse at hs and in AM Baseline hfa very poor - see hfa  rec  Plan A = Automatic = symbicort 160 Take 2 puffs first thing in am and then another 2 puffs about 12 hours later.  Work on inhaler technique:   Plan B = Backup Only use your albuterol as a rescue medication       01/16/2018  f/u ov/Lori Black re: chronic asthma since childhood/ on disability due to asthma  maint on symb 160 Chief Complaint  Patient presents with  . Follow-up    f/u asthma, allergies, sinus problems causing breathing issues, using nebulizers more  baseline Dyspnea:  Not limited by breathing from desired activities   Cough: worse with pollen Sleep: 30 degrees, occ neb noct. Lots of noct nasal congestion SABA use:  Prior to 2-3 weeks prior to OV  Was not needing any saba now even needing neb rec Please remember to go to the lab department downstairs in the basement  for your tests - we will call you  with the results when they are available. Singulair(montelukast)  10 mg one daily  If not better prednisone x 6 days:   Prednisone 10 mg take  4 each am x 2 days,   2 each am x 2 days,  1 each am x 2 days and stop  Plan A = Automatic = symbicort 160 Take 2 puffs first thing in am and then another 2 puffs about 12 hours later.  Work on Clinical biochemist:   Plan B = Backup Only use your albuterol as a rescue medication Plan C = Crisis - only use your albuterol nebulizer if you first try Plan B and it fails to help > ok to use the nebulizer up to every 4 hours but if start needing it regularly call for immediate appointment     04/17/2018  f/u ov/Lori Black re: chronic mild asthma and rhintis with tendence to [place] Chief Complaint  Patient presents with  . Follow-up    Breathing has improved some. She is c/o nasal congestion and "sinus stuff".  She is using her albuterol inhaler 2 x per wk and neb with albuterol once per wk on average.   Dyspnea:  MMRC1 = can walk nl pace, flat grade, can't hurry  or go uphills or steps s sob   Cough: daytime attributes to pnds SABA use: as above, way too much  02: none Sleeping: 1 pillow rec Work on perfecting your inhaler technique:  relax and gently blow all the way out then take a nice smooth deep breath back in, triggering the inhaler at same time you start breathing in.  Hold for up to 5 seconds if you can. Blow out thru nose. Rinse and gargle with water when done [facility] to take prednisone for your nasal symptoms Zyrtec is the best over the counter for nasal congestion as needed Please remember to go to the lab department downstairs in the basement  for your tests - we will call you with the results when they are available. Please schedule a follow up visit in 3 months but call sooner if needed  - consider sinus ct now    07/30/2018  f/u ov/Lori Black re: chronic asthma/ no better on singulair and neither were the nasal symptoms so d/c'd  singulair  Chief Complaint  Patient presents with  . Follow-up    Breathing is overall doing well. She does c/o sinus and nasal congestion.  She uses her albuterol inhaler 1-2 x per wk and neb once per wk on average.   Dyspnea:  Not limited by breathing from desired activities   Cough: none Sleeping: ok flat  SABA use: a couple time a week  rec No change in medications Work on inhaler technique:   schedule sinus CT > ok x prominent concha bullosa    11/06/2018  f/u ov/Lori Black re:  Chronic asthma no change on vs off singulair, nasal steroids cause bleeding/ lots of nasal congestion Chief Complaint  Patient presents with  . Follow-up    sinus congestion, little cough with clear mucus, SOB with outside activity,  Dyspnea:  Not limited by breathing from desired activities  / worse outdoor Cough: minimal / outdoors mostly  Sleeping:  Flat bed / a bunch of pillows SABA use: once or twice a week rec Work on inhaler technique:    Please see patient coordinator before you leave today  to schedule ent referral > Lori Black eval 11/20/18 rec flonase/ns irrigation and allegra > helps some       02/06/2019  f/u ov/Lori Black re: chronic asthma Chief Complaint  Patient presents with  . Follow-up    f/u asthma   Dyspnea:  Does ok at nl pace some hills/steps do make her sob   Cough: minimal with pollen exp moslty  Sleeping: bed is flat / 2 pillows  SABA use: while on symbicort 160 2bid needs once a week saba  02: none rec Plan A = Automatic = symbicort 160 x 2 each am and then the pm dose optional  Plan B = Backup Only use your albuterol inhaler as a rescue medication   Plan C = Crisis - only use your albuterol nebulizer if you first try Plan B and it fails to help > ok to use the nebulizer up to every 4 hours but if start needing it regularly call for immediate appointment    05/07/2019  f/u ov/Lori Black re:  Chronic asthma  Chief Complaint  Patient presents with  . Follow-up    dry cough for  the past 2 days. She is having increased SOB on hot days. She is using the albuterol inhaler at least every other day and neb about 3 x per wk.  Dyspnea:  MMRC1 = can walk nl pace, flat grade,  can't hurry or go uphills or steps s sob   Cough: some mostly daytime, min prod Sleeping: bed is flat, 2 pillows  SABA use: as above - overusing when "over does it"  02: none  rec Plan A = Automatic = symbicort 160 Take 2 puffs first thing in am and then another 2 puffs about 12 hours later.  Plan B = Backup Only use your albuterol inhaler as a rescue medication   Plan C = Crisis - only use your albuterol nebulizer if you first try Plan B      11/21/2019  f/u ov/Broderick Fonseca re: chronic asthma  / rhinitis with ent eval rec flonase but not using due to nose bleeds Chief Complaint  Patient presents with  . Follow-up    Breathing is overall doing well. She states continues to have issues with sinus congestion. She is using her albuterol inhaler and neb both a few x per month.   Dyspnea:  MMRC1 = can walk nl pace, flat grade, can't hurry or go uphills or steps s sob   Cough: minimal Sleeping: bed is flat  2 pllows  SABA use: much less now maint on symb 160 2bid consistently  02: none rec Try dymista one twice daily aimed toward ear on same side and if improve reduce to one at bedtime No other changes. If nose not better let me refer you to an allergist    07/14/2020  f/u ov/Kary Sugrue re: could not afford dymista / on disability due to asthma  Chief Complaint  Patient presents with  . Follow-up    Patient is feeling good overall, no concerns  Dyspnea:   Not limited by breathing from desired activities   Cough: some assoc with pnds  Sleeping: flat bed 2 pillows  SABA use:  Once a week 02:  None    No obvious day to day or daytime variability or assoc excess/ purulent sputum or mucus plugs or hemoptysis or cp or chest tightness, subjective wheeze or overt sinus or hb symptoms.   Sleeping  without  nocturnal  or early am exacerbation  of respiratory  c/o's or need for noct saba. Also denies any obvious fluctuation of symptoms with weather or environmental changes or other aggravating or alleviating factors except as outlined above   No unusual exposure hx or h/o childhood pna  or knowledge of premature birth.  Current Allergies, Complete Past Medical History, Past Surgical History, Family History, and Social History were reviewed in [employer] record.  ROS  The following are not active complaints unless bolded Hoarseness, sore throat, dysphagia, dental problems, itching, sneezing,  nasal congestion or discharge of excess mucus or purulent secretions, ear ache,   fever, chills, sweats, unintended wt loss or wt gain, classically pleuritic or exertional cp,  orthopnea pnd or arm/hand swelling  or leg swelling, presyncope, palpitations, abdominal pain, anorexia, nausea, vomiting, diarrhea  or change in bowel habits or change in bladder habits, change in stools or change in urine, dysuria, hematuria,  rash, arthralgias, visual complaints, headache, numbness, weakness or ataxia or problems with walking or coordination,  change in mood or  memory.        Current Meds  Medication Sig  . albuterol (PROVENTIL) (2.5 MG/3ML) 0.083% nebulizer solution Take 3 mLs (2.5 mg total) by nebulization every 4 (four) hours as needed for wheezing or shortness of breath.  Marland Kitchen albuterol (VENTOLIN HFA) 108 (90 Base) MCG/ACT inhaler Inhale 2 puffs into the lungs every 4 (four)  hours as needed for wheezing or shortness of breath.  . budesonide-formoterol (SYMBICORT) 160-4.5 MCG/ACT inhaler Inhale 2 puffs into the lungs 2 (two) times daily.  . pseudoephedrine-acetaminophen (TYLENOL SINUS) 30-500 MG TABS tablet Take 1 tablet by mouth every 4 (four) hours as needed (congestion).                       Objective:   Physical Exam       07/14/2020         150  11/21/2019           153   05/07/2019         152 02/06/2019        145  11/06/2018        148  07/30/2018      143  04/17/2018          145  01/16/2018          150  04/17/2017          150    03/17/17 150 lb (68 kg)  03/07/17 150 lb 4 oz (68.2 kg)  10/09/16 162 lb 1.6 oz (73.5 kg)     Vital signs reviewed  07/14/2020  - Note at rest 02 sats  95% on RA    HEENT : pt wearing mask not removed for exam due to covid -19 concerns.    NECK :  without JVD/Nodes/TM/ nl carotid upstrokes bilaterally   LUNGS: no acc muscle use,  Nl contour chest which is clear to A and P bilaterally without cough on insp or exp maneuvers   CV:  RRR  no s3 or murmur or increase in P2, and no edema   ABD:  soft and nontender with nl inspiratory excursion in the supine position. No bruits or organomegaly appreciated, bowel sounds nl  MS:  Nl gait/ ext warm without deformities, calf tenderness, cyanosis or clubbing No obvious joint restrictions   SKIN: warm and dry without lesions    NEURO:  alert, approp, nl sensorium with  no motor or cerebellar deficits apparent.           Assessment:

## 2020-07-14 NOTE — Patient Instructions (Signed)
We will be referring you to allergy  - once established they can refill your medication  Ok to subsitute breztri one twice daily for symbicort until your shipment comes in  Pulmonary follow up is as needed

## 2020-07-14 NOTE — Assessment & Plan Note (Addendum)
Onset in childhood - Allergy profile 04/17/2018 >  Eos 0.2 /  IgE  128 RAST pos cat > dog, dust and oak trees No response to singulair as of 06/2018 so d/c'd - Sinus CT  08/07/2018 1. Mild patchy mucosal thickening. 2. Prominent bilateral concha bullosa.> referred to ent as no cat/dog and symptoms are year round /worse outside and not suggestive of an allergic mech   - referred to ent>>>  [name] eval 11/20/18 rec flonase/ns irrigation and allegra > helps some  - 11/21/2019 rec trial of dymista one bid > could not afford it  - 07/14/2020 referred to allergy           Each maintenance medication was reviewed in detail including emphasizing most importantly the difference between maintenance and prns and under what circumstances the prns are to be triggered using an action plan format where appropriate.  Total time for H and P, chart review, counseling, teaching device and generating customized AVS unique to this office visit / charting = 30 min

## 2020-07-16 ENCOUNTER — Ambulatory Visit (HOSPITAL_BASED_OUTPATIENT_CLINIC_OR_DEPARTMENT_OTHER)
Admission: RE | Admit: 2020-07-16 | Discharge: 2020-07-16 | Disposition: A | Discharge status: Home / Self Care | Payer: Medicare Other | Attending: Orthopedic Surgery | Admitting: Orthopedic Surgery

## 2020-07-16 ENCOUNTER — Ambulatory Visit (HOSPITAL_BASED_OUTPATIENT_CLINIC_OR_DEPARTMENT_OTHER): Payer: Medicare Other | Admitting: Anesthesiology

## 2020-07-16 ENCOUNTER — Other Ambulatory Visit: Payer: Self-pay

## 2020-07-16 ENCOUNTER — Encounter (HOSPITAL_BASED_OUTPATIENT_CLINIC_OR_DEPARTMENT_OTHER): Payer: Self-pay | Admitting: Orthopedic Surgery

## 2020-07-16 ENCOUNTER — Encounter (HOSPITAL_BASED_OUTPATIENT_CLINIC_OR_DEPARTMENT_OTHER)
Admission: RE | Disposition: A | Discharge status: Home / Self Care | Payer: Self-pay | Source: Home / Self Care | Attending: Orthopedic Surgery

## 2020-07-16 DIAGNOSIS — M659 Synovitis and tenosynovitis, unspecified: Secondary | ICD-10-CM | POA: Insufficient documentation

## 2020-07-16 DIAGNOSIS — J45901 Unspecified asthma with (acute) exacerbation: Secondary | ICD-10-CM | POA: Diagnosis not present

## 2020-07-16 DIAGNOSIS — Z7951 Long term (current) use of inhaled steroids: Secondary | ICD-10-CM | POA: Diagnosis not present

## 2020-07-16 DIAGNOSIS — J9600 Acute respiratory failure, unspecified whether with hypoxia or hypercapnia: Secondary | ICD-10-CM | POA: Diagnosis not present

## 2020-07-16 DIAGNOSIS — X58XXXA Exposure to other specified factors, initial encounter: Secondary | ICD-10-CM | POA: Insufficient documentation

## 2020-07-16 DIAGNOSIS — Z79899 Other long term (current) drug therapy: Secondary | ICD-10-CM | POA: Insufficient documentation

## 2020-07-16 DIAGNOSIS — J45909 Unspecified asthma, uncomplicated: Secondary | ICD-10-CM | POA: Insufficient documentation

## 2020-07-16 DIAGNOSIS — S63591A Other specified sprain of right wrist, initial encounter: Secondary | ICD-10-CM | POA: Insufficient documentation

## 2020-07-16 DIAGNOSIS — M25331 Other instability, right wrist: Secondary | ICD-10-CM | POA: Insufficient documentation

## 2020-07-16 HISTORY — PX: WRIST ARTHROSCOPY WITH DEBRIDEMENT: SHX6194

## 2020-07-16 HISTORY — PX: WRIST ARTHROSCOPY WITH FOVEAL TRIANGULAR FIBROCARTILAGE COMPLEX REPAIR: SHX6403

## 2020-07-16 LAB — POCT PREGNANCY, URINE: Preg Test, Ur: NEGATIVE

## 2020-07-16 SURGERY — WRIST ARTHROSCOPY WITH DEBRIDEMENT
Anesthesia: Monitor Anesthesia Care | Site: Wrist | Laterality: Right

## 2020-07-16 MED ORDER — ONDANSETRON HCL 4 MG/2ML IJ SOLN
INTRAMUSCULAR | Status: DC | PRN
  Administered 2020-07-16: 4 mg via INTRAVENOUS

## 2020-07-16 MED ORDER — FENTANYL CITRATE (PF) 100 MCG/2ML IJ SOLN
INTRAMUSCULAR | Status: AC
  Filled 2020-07-16: qty 2

## 2020-07-16 MED ORDER — PROMETHAZINE HCL 25 MG/ML IJ SOLN: 6.2500 mg | INTRAMUSCULAR | Status: DC | PRN

## 2020-07-16 MED ORDER — HYDROMORPHONE HCL 1 MG/ML IJ SOLN: 0.2500 mg | INTRAMUSCULAR | Status: DC | PRN

## 2020-07-16 MED ORDER — DEXAMETHASONE SODIUM PHOSPHATE 4 MG/ML IJ SOLN
INTRAMUSCULAR | Status: DC | PRN
  Administered 2020-07-16: 10 mg via INTRAVENOUS

## 2020-07-16 MED ORDER — ROPIVACAINE HCL 5 MG/ML IJ SOLN
INTRAMUSCULAR | Status: DC | PRN
  Administered 2020-07-16: 30 mL via PERINEURAL

## 2020-07-16 MED ORDER — LACTATED RINGERS IV SOLN: INTRAVENOUS | Status: DC

## 2020-07-16 MED ORDER — FENTANYL CITRATE (PF) 100 MCG/2ML IJ SOLN
INTRAMUSCULAR | Status: DC | PRN
Start: 2020-07-16 — End: 2020-07-16
  Administered 2020-07-16: 50 ug via INTRAVENOUS

## 2020-07-16 MED ORDER — PROPOFOL 500 MG/50ML IV EMUL
INTRAVENOUS | Status: DC | PRN
  Administered 2020-07-16: 50 ug/kg/min via INTRAVENOUS

## 2020-07-16 MED ORDER — MIDAZOLAM HCL 2 MG/2ML IJ SOLN
INTRAMUSCULAR | Status: AC
  Filled 2020-07-16: qty 2

## 2020-07-16 MED ORDER — CEFAZOLIN SODIUM-DEXTROSE 2-4 GM/100ML-% IV SOLN
INTRAVENOUS | Status: AC
  Filled 2020-07-16: qty 100

## 2020-07-16 MED ORDER — CEFAZOLIN SODIUM-DEXTROSE 2-4 GM/100ML-% IV SOLN
2.0000 g | INTRAVENOUS | Status: AC
  Administered 2020-07-16: 2 g via INTRAVENOUS

## 2020-07-16 MED ORDER — MIDAZOLAM HCL 5 MG/5ML IJ SOLN
INTRAMUSCULAR | Status: DC | PRN
  Administered 2020-07-16: 1 mg via INTRAVENOUS
  Administered 2020-07-16 (×2): .5 mg via INTRAVENOUS

## 2020-07-16 MED ORDER — MIDAZOLAM HCL 2 MG/2ML IJ SOLN
2.0000 mg | Freq: Once | INTRAMUSCULAR | Status: AC
  Administered 2020-07-16: 2 mg via INTRAVENOUS

## 2020-07-16 MED ORDER — FENTANYL CITRATE (PF) 100 MCG/2ML IJ SOLN
100.0000 ug | Freq: Once | INTRAMUSCULAR | Status: AC
  Administered 2020-07-16: 100 ug via INTRAVENOUS

## 2020-07-16 MED ORDER — HYDROCODONE-ACETAMINOPHEN 5-325 MG PO TABS
ORAL_TABLET | ORAL | 0 refills | Status: DC
Start: 2020-07-16 — End: 2021-04-30

## 2020-07-16 MED ORDER — OXYCODONE HCL 5 MG/5ML PO SOLN: 5.0000 mg | Freq: Once | ORAL | Status: DC | PRN

## 2020-07-16 MED ORDER — OXYCODONE HCL 5 MG PO TABS: 5.0000 mg | ORAL_TABLET | Freq: Once | ORAL | Status: DC | PRN

## 2020-07-16 SURGICAL SUPPLY — 80 items
APL PRP STRL LF DISP 70% ISPRP (MISCELLANEOUS) ×1
BLADE EAR TYMPAN 2.5 60D BEAV (BLADE) IMPLANT
BLADE MINI RND TIP GREEN BEAV (BLADE) IMPLANT
BLADE SURG 15 STRL LF DISP TIS (BLADE) ×2 IMPLANT
BLADE SURG 15 STRL SS (BLADE) ×6
BNDG CMPR 9X4 STRL LF SNTH (GAUZE/BANDAGES/DRESSINGS) ×1
BNDG ELASTIC 3X5.8 VLCR STR LF (GAUZE/BANDAGES/DRESSINGS) ×3 IMPLANT
BNDG ELASTIC 4X5.8 VLCR STR LF (GAUZE/BANDAGES/DRESSINGS) ×3 IMPLANT
BNDG ESMARK 4X9 LF (GAUZE/BANDAGES/DRESSINGS) ×3 IMPLANT
BNDG GAUZE ELAST 4 BULKY (GAUZE/BANDAGES/DRESSINGS) ×3 IMPLANT
CANISTER SUCT 1200ML W/VALVE (MISCELLANEOUS) IMPLANT
CHLORAPREP W/TINT 26 (MISCELLANEOUS) ×3 IMPLANT
CLOSURE WOUND 1/2 X4 (GAUZE/BANDAGES/DRESSINGS)
CLOSURE WOUND 1/4X4 (GAUZE/BANDAGES/DRESSINGS)
CORD BIPOLAR FORCEPS 12FT (ELECTRODE) IMPLANT
COVER BACK TABLE 60X90IN (DRAPES) ×3 IMPLANT
COVER MAYO STAND REUSABLE (DRAPES) ×3 IMPLANT
COVER WAND RF STERILE (DRAPES) IMPLANT
CUFF TOURN SGL QUICK 18X4 (TOURNIQUET CUFF) ×3 IMPLANT
DRAPE EXTREMITY T 121X128X90 (DISPOSABLE) ×3 IMPLANT
DRAPE IMP U-DRAPE 54X76 (DRAPES) ×3 IMPLANT
DRAPE OEC MINIVIEW 54X84 (DRAPES) IMPLANT
DRAPE SURG 17X23 STRL (DRAPES) ×3 IMPLANT
ELECT SMALL JOINT 90D BASC (ELECTRODE) IMPLANT
GAUZE SPONGE 4X4 12PLY STRL (GAUZE/BANDAGES/DRESSINGS) ×3 IMPLANT
GAUZE XEROFORM 1X8 LF (GAUZE/BANDAGES/DRESSINGS) ×3 IMPLANT
GLOVE BIO SURGEON STRL SZ7 (GLOVE) ×3 IMPLANT
GLOVE BIO SURGEON STRL SZ7.5 (GLOVE) ×3 IMPLANT
GLOVE BIOGEL PI IND STRL 6.5 (GLOVE) ×1 IMPLANT
GLOVE BIOGEL PI IND STRL 8.5 (GLOVE) ×1 IMPLANT
GLOVE BIOGEL PI INDICATOR 6.5 (GLOVE) ×2
GLOVE BIOGEL PI INDICATOR 8.5 (GLOVE) ×2
GLOVE ECLIPSE 6.5 STRL STRAW (GLOVE) ×3 IMPLANT
GLOVE SURG ORTHO 8.0 STRL STRW (GLOVE) ×6 IMPLANT
GOWN STRL REUS W/ TWL LRG LVL3 (GOWN DISPOSABLE) ×1 IMPLANT
GOWN STRL REUS W/TWL LRG LVL3 (GOWN DISPOSABLE) ×3
GOWN STRL REUS W/TWL XL LVL3 (GOWN DISPOSABLE) ×6 IMPLANT
IV SET EXTENSION GRAVITY 40 LF (IV SETS) ×3 IMPLANT
MANIFOLD NEPTUNE II (INSTRUMENTS) IMPLANT
NDL SAFETY ECLIPSE 18X1.5 (NEEDLE) ×1 IMPLANT
NEEDLE HYPO 18GX1.5 SHARP (NEEDLE) ×3
NEEDLE HYPO 22GX1.5 SAFETY (NEEDLE) ×3 IMPLANT
NEEDLE SPNL 18GX3.5 QUINCKE PK (NEEDLE) IMPLANT
NEEDLE TUOHY 20GX3.5 (NEEDLE) IMPLANT
PACK BASIN DAY SURGERY FS (CUSTOM PROCEDURE TRAY) ×3 IMPLANT
PAD CAST 3X4 CTTN HI CHSV (CAST SUPPLIES) ×1 IMPLANT
PADDING CAST ABS 3INX4YD NS (CAST SUPPLIES) ×2
PADDING CAST ABS 4INX4YD NS (CAST SUPPLIES) ×2
PADDING CAST ABS COTTON 3X4 (CAST SUPPLIES) ×1 IMPLANT
PADDING CAST ABS COTTON 4X4 ST (CAST SUPPLIES) ×1 IMPLANT
PADDING CAST COTTON 3X4 STRL (CAST SUPPLIES) ×3
SET SM JOINT TUBING/CANN (CANNULA) IMPLANT
SHAVER DISSECTOR 3.0 (BURR) IMPLANT
SHAVER SABRE 2.0 (BURR) ×3 IMPLANT
SLEEVE SCD COMPRESS KNEE MED (MISCELLANEOUS) ×3 IMPLANT
SPLINT PLASTER CAST XFAST 3X15 (CAST SUPPLIES) ×30 IMPLANT
SPLINT PLASTER XTRA FASTSET 3X (CAST SUPPLIES) ×60
STOCKINETTE 4X48 STRL (DRAPES) ×3 IMPLANT
STRIP CLOSURE SKIN 1/2X4 (GAUZE/BANDAGES/DRESSINGS) IMPLANT
STRIP CLOSURE SKIN 1/4X4 (GAUZE/BANDAGES/DRESSINGS) IMPLANT
SUCTION FRAZIER HANDLE 10FR (MISCELLANEOUS)
SUCTION TUBE FRAZIER 10FR DISP (MISCELLANEOUS) IMPLANT
SUT ETHILON 4 0 PS 2 18 (SUTURE) ×3 IMPLANT
SUT ETHILON 5 0 P 3 18 (SUTURE)
SUT MERSILENE 4 0 P 3 (SUTURE) ×3 IMPLANT
SUT NYLON ETHILON 5-0 P-3 1X18 (SUTURE) IMPLANT
SUT PDS AB 2-0 CT2 27 (SUTURE) ×3 IMPLANT
SUT STEEL 4 0 (SUTURE) ×3 IMPLANT
SUT VIC AB 2-0 PS2 27 (SUTURE) ×3 IMPLANT
SUT VICRYL 4-0 PS2 18IN ABS (SUTURE) IMPLANT
SYR BULB EAR ULCER 3OZ GRN STR (SYRINGE) ×3 IMPLANT
SYR CONTROL 10ML LL (SYRINGE) ×3 IMPLANT
TRAP DIGIT (INSTRUMENTS) IMPLANT
TRAP FINGER LRG (INSTRUMENTS) ×3 IMPLANT
TUBE CONNECTING 20'X1/4 (TUBING) ×1
TUBE CONNECTING 20X1/4 (TUBING) ×2 IMPLANT
TUBING ARTHROSCOPY IRRIG 16FT (MISCELLANEOUS) IMPLANT
UNDERPAD 30X36 HEAVY ABSORB (UNDERPADS AND DIAPERS) ×3 IMPLANT
WAND 1.5 MICROBLATOR (SURGICAL WAND) ×3 IMPLANT
WATER STERILE IRR 1000ML POUR (IV SOLUTION) ×3 IMPLANT

## 2020-07-16 NOTE — Discharge Instructions (Addendum)

## 2020-07-16 NOTE — H&P (Signed)
Lori Black is an 34 y.o. female.   Chief Complaint: right wrist pain HPI: 34 yo female with right wrist ulnar sided pain.  Has tried non operative treatment without lasting relief of symptoms.  She wishes to proceed with right wrist arthroscopy with debridement and repair of tfcc as necessary with ECU stabilization and possible ulnar groove deepening.  Allergies: No Known Allergies  Past Medical History:  Diagnosis Date  . Asthma     Past Surgical History:  Procedure Laterality Date  . CESAREAN SECTION      Family History: Family History  Problem Relation Age of Onset  . Diabetes Mother     Social History:   reports that she has never smoked. She has never used smokeless tobacco. She reports current alcohol use. She reports that she does not use drugs.  Medications: Medications Prior to Admission  Medication Sig Dispense Refill  . albuterol (PROVENTIL) (2.5 MG/3ML) 0.083% nebulizer solution Take 3 mLs (2.5 mg total) by nebulization every 4 (four) hours as needed for wheezing or shortness of breath. 150 mL 1  . albuterol (VENTOLIN HFA) 108 (90 Base) MCG/ACT inhaler Inhale 2 puffs into the lungs every 4 (four) hours as needed for wheezing or shortness of breath. 18 g 2  . Budeson-Glycopyrrol-Formoterol (BREZTRI AEROSPHERE) 160-9-4.8 MCG/ACT AERO Inhale 2 puffs into the lungs 2 (two) times daily. 10.7 g 0  . budesonide-formoterol (SYMBICORT) 160-4.5 MCG/ACT inhaler Inhale 2 puffs into the lungs 2 (two) times daily. 2 Inhaler 0  . pseudoephedrine-acetaminophen (TYLENOL SINUS) 30-500 MG TABS tablet Take 1 tablet by mouth every 4 (four) hours as needed (congestion).       Results for orders placed or performed during the hospital encounter of 07/16/20 (from the past 48 hour(s))  Pregnancy, urine POC     Status: None   Collection Time: 07/16/20  9:12 AM  Result Value Ref Range   Preg Test, Ur NEGATIVE NEGATIVE    Comment:        THE SENSITIVITY OF THIS METHODOLOGY IS >24 mIU/mL      No results found.   A comprehensive review of systems was negative.  Blood pressure 129/81, pulse 95, temperature 98.1 F (36.7 C), temperature source Oral, resp. rate 13, height 5\' 6"  (1.676 m), weight 68.3 kg, last menstrual period 06/17/2020, SpO2 100 %.  General appearance: alert, cooperative and appears stated age Head: Normocephalic, without obvious abnormality, atraumatic Neck: supple, symmetrical, trachea midline Cardio: regular rate and rhythm Resp: clear to auscultation bilaterally Extremities: Intact sensation and capillary refill all digits.  +epl/fpl/io.  No wounds.  Pulses: 2+ and symmetric Skin: Skin color, texture, turgor normal. No rashes or lesions Neurologic: Grossly normal Incision/Wound: none  Assessment/Plan Right tfcc tear and ECU instability.  Plan right wrist arthroscopy with debridement and repair tfcc with ECU stabilization and ulnar groove deepening as necessary.  Non operative and operative treatment options have been discussed with the patient and patient wishes to proceed with operative treatment. Risks, benefits, and alternatives of surgery have been discussed and the patient agrees with the plan of care.   08/17/2020 07/16/2020, 9:50 AM

## 2020-07-16 NOTE — Anesthesia Postprocedure Evaluation (Signed)
Anesthesia Post Note  Patient: Chief Financial Officer  Procedure(s) Performed: WRIST ARTHROSCOPY WITH DEBRIDEMENT TRIANGULAR FIBRO CARTILAGE REPAIR SOFT TISSUE WITH EXTENSOR CARPAL RADIALIS STABILIZATION  (Right Wrist)     Patient location during evaluation: PACU Anesthesia Type: Regional Level of consciousness: awake and alert Pain management: pain level controlled Vital Signs Assessment: post-procedure vital signs reviewed and stable Respiratory status: spontaneous breathing, nonlabored ventilation and respiratory function stable Cardiovascular status: blood pressure returned to baseline and stable Postop Assessment: no apparent nausea or vomiting Anesthetic complications: no   No complications documented.  Last Vitals:  Vitals:   07/16/20 1245 07/16/20 1322  BP: 128/67 116/74  Pulse: 68 86  Resp: 14 20  Temp:  36.7 C  SpO2: 94% 100%    Last Pain:  Vitals:   07/16/20 1322  TempSrc:   PainSc: 3                  Lowella Curb

## 2020-07-16 NOTE — Anesthesia Procedure Notes (Signed)
Anesthesia Regional Block: Supraclavicular block   Pre-Anesthetic Checklist: ,, timeout performed, Correct Patient, Correct Site, Correct Laterality, Correct Procedure, Correct Position, site marked, Risks and benefits discussed,  Surgical consent,  Pre-op evaluation,  At surgeon's request and post-op pain management  Laterality: Right  Prep: chloraprep       Needles:  Injection technique: Single-shot  Needle Type: Stimiplex     Needle Length: 9cm  Needle Gauge: 21     Additional Needles:   Procedures:,,,, ultrasound used (permanent image in chart),,,,  Narrative:  Start time: 07/16/2020 9:49 AM End time: 07/16/2020 9:54 AM Injection made incrementally with aspirations every 5 mL.  Performed by: Personally  Anesthesiologist: Lowella Curb, MD

## 2020-07-16 NOTE — Op Note (Signed)
I assisted Surgeon(s) and Role:    * Betha Loa, MD - Primary    * Cindee Salt, MD on the Procedure(s): WRIST ARTHROSCOPY WITH DEBRIDEMENT TRIANGULAR FIBRO CARTILAGE REPAIR SOFT TISSUE WITH EXTENSOR CARPAL RADIALIS STABILIZATION  on 07/16/2020.  I provided assistance on this case as follows: set up, arthroscopy portal establishment, debridement and repair of TFCC, open reconstruction of ECU subluxation and stabilization, closure of incisions and application of the dressings and splint. right wrist  Electronically signed by: Cindee Salt, MD Date: 07/16/2020 Time: 12:07 PM

## 2020-07-16 NOTE — Anesthesia Preprocedure Evaluation (Signed)
Anesthesia Evaluation  Patient identified by MRN, date of birth, ID band Patient awake    Reviewed: Allergy & Precautions, NPO status , Patient's Chart, lab work & pertinent test results  Airway Mallampati: II  TM Distance: >3 FB Neck ROM: Full    Dental no notable dental hx.    Pulmonary asthma ,    Pulmonary exam normal breath sounds clear to auscultation       Cardiovascular negative cardio ROS Normal cardiovascular exam Rhythm:Regular Rate:Normal     Neuro/Psych negative neurological ROS  negative psych ROS   GI/Hepatic negative GI ROS, Neg liver ROS,   Endo/Other  negative endocrine ROS  Renal/GU negative Renal ROS  negative genitourinary   Musculoskeletal negative musculoskeletal ROS (+)   Abdominal   Peds negative pediatric ROS (+)  Hematology negative hematology ROS (+)   Anesthesia Other Findings   Reproductive/Obstetrics negative OB ROS                             Anesthesia Physical Anesthesia Plan  ASA: II  Anesthesia Plan: MAC and Regional   Post-op Pain Management:    Induction: Intravenous  PONV Risk Score and Plan: 2 and Ondansetron, Midazolam and Treatment may vary due to age or medical condition  Airway Management Planned: Simple Face Mask  Additional Equipment:   Intra-op Plan:   Post-operative Plan:   Informed Consent: I have reviewed the patients History and Physical, chart, labs and discussed the procedure including the risks, benefits and alternatives for the proposed anesthesia with the patient or authorized representative who has indicated his/her understanding and acceptance.     Dental advisory given  Plan Discussed with: CRNA  Anesthesia Plan Comments:         Anesthesia Quick Evaluation

## 2020-07-16 NOTE — Transfer of Care (Signed)
Immediate Anesthesia Transfer of Care Note  Patient: Lori Black  Procedure(s) Performed: WRIST ARTHROSCOPY WITH DEBRIDEMENT TRIANGULAR FIBRO CARTILAGE REPAIR SOFT TISSUE WITH EXTENSOR CARPAL RADIALIS STABILIZATION  (Right Wrist)  Patient Location: PACU  Anesthesia Type:MAC combined with regional for post-op pain  Level of Consciousness: awake  Airway & Oxygen Therapy: Patient Spontanous Breathing and Patient connected to face mask oxygen  Post-op Assessment: Report given to RN and Post -op Vital signs reviewed and stable  Post vital signs: Reviewed and stable  Last Vitals:  Vitals Value Taken Time  BP    Temp    Pulse 84 07/16/20 1210  Resp    SpO2 98 % 07/16/20 1210  Vitals shown include unvalidated device data.  Last Pain:  Vitals:   07/16/20 0908  TempSrc: Oral  PainSc: 3       Patients Stated Pain Goal: 3 (16/38/46 6599)  Complications: No complications documented.

## 2020-07-16 NOTE — Progress Notes (Signed)
Assisted Dr. Miller with right, ultrasound guided, supraclavicular block. Side rails up, monitors on throughout procedure. See vital signs in flow sheet. Tolerated Procedure well. 

## 2020-07-16 NOTE — Op Note (Addendum)
NAME: Lori Black MEDICAL RECORD NO: [id number] DATE OF BIRTH: January 15, 1986 FACILITY: [facility] LOCATION: [facility] PHYSICIAN: Tami Ribas, MD   OPERATIVE REPORT   DATE OF PROCEDURE: 07/16/20    PREOPERATIVE DIAGNOSIS:   Right wrist triangular fibrocartilage complex tear and extensor carpi ulnaris subluxation   POSTOPERATIVE DIAGNOSIS:   Right wrist triangular fibrocartilage complex tear and extensor carpi ulnaris subluxation   PROCEDURE:   1.  Right wrist arthroscopy with debridement and repair of Triangular fibrocartilage complex 2.  Right wrist extensor carpi ulnaris stabilization through separate incision   SURGEON:  Betha Loa, M.D.   ASSISTANT: Cindee Salt, MD   ANESTHESIA:  Regional with sedation   INTRAVENOUS FLUIDS:  Per anesthesia flow sheet.   ESTIMATED BLOOD LOSS:  Minimal.   COMPLICATIONS:  None.   SPECIMENS:  none   TOURNIQUET TIME:    Total Tourniquet Time Documented: Upper Arm (Right) - 35 minutes Total: Upper Arm (Right) - 35 minutes    DISPOSITION:  Stable to PACU.   INDICATIONS: 34 year old female with right wrist pain.  This has been treated nonoperatively without lasting relief.  MRI shows triangular fibrocartilage complex tear and extensor carpi ulnaris subluxation.   She wishes to proceed with right wrist arthroscopy with triangular fibrocartilage complex repair and extensor carpi ulnaris stabilization.  Risks, benefits and alternatives of surgery were discussed including the risks of blood loss, infection, damage to nerves, vessels, tendons, ligaments, bone for surgery, need for additional surgery, complications with wound healing, continued pain, stiffness.  She voiced understanding of these risks and elected to proceed.  OPERATIVE COURSE:  After being identified preoperatively by myself,  the patient and I agreed on the procedure and site of the procedure.  The surgical site was marked.  Surgical consent had been signed. She was  given IV antibiotics as preoperative antibiotic prophylaxis. She was transferred to the operating room and placed on the operating table in supine position with the Right upper extremity on an arm board.  Sedation was induced by the anesthesiologist. A regional block had been performed by anesthesia in preoperative holding.   Right upper extremity was prepped and draped in normal sterile orthopedic fashion.  A surgical pause was performed between the surgeons, anesthesia, and operating room staff and all were in agreement as to the patient, procedure, and site of procedure.  Tourniquet was not inflated.    The wrist and hand were secured in the arthroscopy tower.  The 3-4 portal was made through the skin only after insufflation of the joint.  The joint was entered with the trocar.  The camera introduced.  The joint was examined.  The SL ligament appeared intact.  The triangular fibrocartilage complex had patulousness and was thin at its peripheral attachment.  There was a hole toward the dorsal aspect.  The shaver was introduced through the 3-4 portal after making a portal through the incision only an inflamed joint with a cannula.  The shaver was used to debride the joint.  The small hole at the dorsal aspect of the triangular fibrocartilage complex was debrided.  The ArthroCare wand was introduced to take down the synovitis at the ulnar side of the joint.  The repair of the peripheral attachment of the triangular fibrocartilage complex was performed.  2 18-gauge needles were introduced from outside through the triangular fibrocartilage complex.  A wire was placed through one needle in the second needle passed through the wire.  A 2-0 PDS suture was then passed into  the needle and retrieved with a wire.  The ends were brought out through the skin.  Traction on the PDS suture the triangular fibrocartilage complex reapproximated to the capsule well.  The wrist was stable to shuck testing in both pronation and  supination.  He can was introduced into the midcarpal joint.  There was no instability noted in the midcarpal joint.  The arthroscopy equipment was removed.  The wrist was taken out of the arthroscopy tower.  Tourniquet the proximal aspect of the extremities inflated to 250 mmHg after exsanguination of the limb with an Esmarch bandage.  An incision was made at the dorsal ulnar aspect of the wrist.  This was carried in subcutaneous tissues by spreading technique.  Dorsal branch of the ulnar nerve was identified and protected throughout the case.  The PDS sutures were retrieved.  The soft tissues were freed up between them so that they can be tied over the capsule only.  The extensor carpi ulnaris tendon was identified.  The retinaculum was released at the far ulnar side of the wrist and released over top of the superior was then brought underneath the extensor carpi ulnarisand sewn back over to itself to bring the extensor carpi ulnaris held into the ulnar groove better.  It was secured with 4-0 Mersilene suture.  Good position of the C was obtained.  The PDS suture was then tied over the capsule.  The wounds were then closed with 4-0 nylon in a horizontal mattress fashion.  They were dressed with sterile Xeroform 4 x 4's and wrapped with a Kerlix bandage.  A sugar tong splint was placed with the wrist in pronation.  This was wrapped with Kerlix and Ace bandage.  The tourniquet was deflated at 35 minutes.  Fingertips were pink with brisk capillary refill after deflation of tourniquet.  The operative  drapes were broken down.  The patient was awoken from anesthesia safely.  She was transferred back to the stretcher and taken to PACU in stable condition.  I will see her back in the office in 1 week for postoperative followup.  I will give her a prescription for Norco 5/325 1-2 tabs PO q6 hours prn pain, dispense # 30.   Betha Loa, MD Electronically signed, 07/16/20

## 2020-07-20 ENCOUNTER — Encounter (HOSPITAL_BASED_OUTPATIENT_CLINIC_OR_DEPARTMENT_OTHER): Payer: Self-pay | Admitting: Orthopedic Surgery

## 2020-07-24 ENCOUNTER — Encounter (HOSPITAL_BASED_OUTPATIENT_CLINIC_OR_DEPARTMENT_OTHER): Payer: Self-pay | Admitting: Orthopedic Surgery

## 2020-07-28 DIAGNOSIS — R202 Paresthesia of skin: Secondary | ICD-10-CM | POA: Diagnosis not present

## 2020-07-28 DIAGNOSIS — R0789 Other chest pain: Secondary | ICD-10-CM | POA: Diagnosis not present

## 2020-07-28 DIAGNOSIS — I959 Hypotension, unspecified: Secondary | ICD-10-CM | POA: Diagnosis not present

## 2020-07-28 DIAGNOSIS — R0689 Other abnormalities of breathing: Secondary | ICD-10-CM | POA: Diagnosis not present

## 2020-07-28 DIAGNOSIS — R079 Chest pain, unspecified: Secondary | ICD-10-CM | POA: Diagnosis not present

## 2020-08-26 DIAGNOSIS — M25531 Pain in right wrist: Secondary | ICD-10-CM | POA: Diagnosis not present

## 2020-08-26 DIAGNOSIS — S63591D Other specified sprain of right wrist, subsequent encounter: Secondary | ICD-10-CM | POA: Diagnosis not present

## 2020-08-26 DIAGNOSIS — J320 Chronic maxillary sinusitis: Secondary | ICD-10-CM | POA: Diagnosis not present

## 2020-08-26 DIAGNOSIS — S63093A Other subluxation of unspecified wrist and hand, initial encounter: Secondary | ICD-10-CM | POA: Diagnosis not present

## 2020-09-08 DIAGNOSIS — M25531 Pain in right wrist: Secondary | ICD-10-CM | POA: Diagnosis not present

## 2020-09-08 DIAGNOSIS — S63591D Other specified sprain of right wrist, subsequent encounter: Secondary | ICD-10-CM | POA: Diagnosis not present

## 2020-09-08 DIAGNOSIS — S63093A Other subluxation of unspecified wrist and hand, initial encounter: Secondary | ICD-10-CM | POA: Diagnosis not present

## 2020-09-08 DIAGNOSIS — M25631 Stiffness of right wrist, not elsewhere classified: Secondary | ICD-10-CM | POA: Diagnosis not present

## 2020-09-21 DIAGNOSIS — S63093A Other subluxation of unspecified wrist and hand, initial encounter: Secondary | ICD-10-CM | POA: Diagnosis not present

## 2020-09-21 DIAGNOSIS — S63591D Other specified sprain of right wrist, subsequent encounter: Secondary | ICD-10-CM | POA: Diagnosis not present

## 2020-09-21 DIAGNOSIS — M25531 Pain in right wrist: Secondary | ICD-10-CM | POA: Diagnosis not present

## 2020-09-21 DIAGNOSIS — M25631 Stiffness of right wrist, not elsewhere classified: Secondary | ICD-10-CM | POA: Diagnosis not present

## 2020-10-05 DIAGNOSIS — J320 Chronic maxillary sinusitis: Secondary | ICD-10-CM | POA: Diagnosis not present

## 2020-10-05 DIAGNOSIS — M25531 Pain in right wrist: Secondary | ICD-10-CM | POA: Diagnosis not present

## 2020-10-05 DIAGNOSIS — S63093A Other subluxation of unspecified wrist and hand, initial encounter: Secondary | ICD-10-CM | POA: Diagnosis not present

## 2020-10-05 DIAGNOSIS — S63591D Other specified sprain of right wrist, subsequent encounter: Secondary | ICD-10-CM | POA: Diagnosis not present

## 2020-10-05 DIAGNOSIS — M25631 Stiffness of right wrist, not elsewhere classified: Secondary | ICD-10-CM | POA: Diagnosis not present

## 2020-11-13 DIAGNOSIS — S63093A Other subluxation of unspecified wrist and hand, initial encounter: Secondary | ICD-10-CM | POA: Diagnosis not present

## 2020-12-04 ENCOUNTER — Telehealth: Payer: Self-pay | Admitting: Internal Medicine

## 2020-12-04 MED ORDER — BUDESONIDE-FORMOTEROL FUMARATE 160-4.5 MCG/ACT IN AERO: 2.0000 | INHALATION_SPRAY | Freq: Two times a day (BID) | RESPIRATORY_TRACT | 0 refills | Status: DC

## 2020-12-04 MED ORDER — ALBUTEROL SULFATE (2.5 MG/3ML) 0.083% IN NEBU: 2.5000 mg | INHALATION_SOLUTION | RESPIRATORY_TRACT | 0 refills | Status: DC | PRN

## 2020-12-04 NOTE — Telephone Encounter (Signed)
Called and spoke with pt and she stated that she has pending appt with MW on 3/4.  Pt was going to be out of meds prior to appt.  She is aware that these have been sent to the pharmacy and nothing further is needed.

## 2020-12-18 ENCOUNTER — Other Ambulatory Visit: Payer: Self-pay

## 2020-12-18 ENCOUNTER — Encounter: Payer: Self-pay | Admitting: Adult Health

## 2020-12-18 ENCOUNTER — Ambulatory Visit (INDEPENDENT_AMBULATORY_CARE_PROVIDER_SITE_OTHER): Payer: Medicare Other | Admitting: Adult Health

## 2020-12-18 ENCOUNTER — Ambulatory Visit: Payer: Medicare Other | Admitting: Internal Medicine

## 2020-12-18 VITALS — BP 124/78 | HR 82 | Temp 98.1°F | Ht 65.0 in | Wt 147.0 lb

## 2020-12-18 DIAGNOSIS — J310 Chronic rhinitis: Secondary | ICD-10-CM | POA: Diagnosis not present

## 2020-12-18 DIAGNOSIS — J4530 Mild persistent asthma, uncomplicated: Secondary | ICD-10-CM | POA: Diagnosis not present

## 2020-12-18 MED ORDER — ALBUTEROL SULFATE (2.5 MG/3ML) 0.083% IN NEBU: 2.5000 mg | INHALATION_SOLUTION | RESPIRATORY_TRACT | 3 refills | Status: DC | PRN

## 2020-12-18 MED ORDER — BUDESONIDE-FORMOTEROL FUMARATE 160-4.5 MCG/ACT IN AERO: 2.0000 | INHALATION_SPRAY | Freq: Two times a day (BID) | RESPIRATORY_TRACT | 3 refills | Status: DC

## 2020-12-18 MED ORDER — ALBUTEROL SULFATE HFA 108 (90 BASE) MCG/ACT IN AERS: 2.0000 | INHALATION_SPRAY | RESPIRATORY_TRACT | 3 refills | Status: DC | PRN

## 2020-12-18 NOTE — Assessment & Plan Note (Signed)
Trigger prevention discussed.  Add in Claritin daily.

## 2020-12-18 NOTE — Progress Notes (Signed)
@Patient  ID: , female    DOB: 04-07-1986, 35 y.o.   MRN: [id number]  Chief Complaint  Patient presents with  . Follow-up    Referring provider: No ref. provider found  HPI: 35 year old female never smoker followed for asthma  TEST/EVENTS :  Allergy profile 04/17/2018 >  Eos 0.2 /  IgE  128 RAST pos cat > dog, dust and oak trees No response to singulair as of 06/2018 so d/c'd - Sinus CT  08/07/2018 1. Mild patchy mucosal thickening. 2. Prominent bilateral concha bullosa.> referred to ent as no cat/dog and symptoms are year round /worse outside and not suggestive of an allergic mech   - referred to ent>>>  [name] eval 11/20/18 rec flonase/ns irrigation and allegra > helps some  - 11/21/2019 rec trial of dymista one bid > could not afford it  - 07/14/2020 referred to allergy  FENO 03/17/2017  =   30 on final day of prednisone - Spirometry 03/17/2017  wnl s curvature on last day of pred p saba 5 h prior  - 03/17/2017   Start symb 160 2bid - 01/16/2018  After extensive coaching inhaler device  effectiveness =    75% (short Ti)   - FENO 01/16/2018  =   64 on symb 160 2bid  - Spirometry 01/16/2018  wnl with  Min curvature  - 01/16/2018 added singulair - Allergy profile 04/17/2018 >  Eos 0.2 /  IgE  128 RAST pos cat > dog, dust and oak trees - FENO 04/17/2018  =  30 p  symb 160/ singulair   12/18/2020 Follow up : Asthma and CR  Patient returns for a 75-month follow-up.  Patient has underlying asthma and chronic rhinitis.  Patient remains on Symbicort twice daily.  She is getting this through the patient assistance program.  Patient does not have prescription coverage. She says overall breathing is doing okay with no flare of cough or wheezing.  Says she does notice it when the weather changes she needs to use her albuterol more. Last visit was referred to the allergist but was unable to go due to surgery .Had to have wrist surgery , and therapy. Wants referral sent again.  Symbicort going through  patient assistance program .  Currently not take anything for her allergies.  Does have some nasal congestion and drainage.  Denies any fever chest pain orthopnea PND or increased leg swelling.   No Known Allergies  Immunization History  Administered Date(s) Administered  . Influenza,inj,Quad PF,6+ Mos 07/18/2017    Past Medical History:  Diagnosis Date  . Asthma     Tobacco History: Social History   Tobacco Use  Smoking Status Never Smoker  Smokeless Tobacco Never Used   Counseling given: Not Answered   Outpatient Medications Prior to Visit  Medication Sig Dispense Refill  . albuterol (PROVENTIL) (2.5 MG/3ML) 0.083% nebulizer solution Take 3 mLs (2.5 mg total) by nebulization every 4 (four) hours as needed for wheezing or shortness of breath. 150 mL 0  . albuterol (VENTOLIN HFA) 108 (90 Base) MCG/ACT inhaler Inhale 2 puffs into the lungs every 4 (four) hours as needed for wheezing or shortness of breath. 18 g 2  . budesonide-formoterol (SYMBICORT) 160-4.5 MCG/ACT inhaler Inhale 2 puffs into the lungs 2 (two) times daily. 1 each 0  . Budeson-Glycopyrrol-Formoterol (BREZTRI AEROSPHERE) 160-9-4.8 MCG/ACT AERO Inhale 2 puffs into the lungs 2 (two) times daily. (Patient not taking: Reported on 12/18/2020) 10.7 g 0  . HYDROcodone-acetaminophen (NORCO) 5-325 MG  tablet 1-2 tabs po q6 hours prn pain (Patient not taking: Reported on 12/18/2020) 30 tablet 0   No facility-administered medications prior to visit.     Review of Systems:   Constitutional:   No  weight loss, night sweats,  Fevers, chills, fatigue, or  lassitude.  HEENT:   No headaches,  Difficulty swallowing,  Tooth/dental problems, or  Sore throat,                No sneezing, itching, ear ache,  +nasal congestion, post nasal drip,   CV:  No chest pain,  Orthopnea, PND, swelling in lower extremities, anasarca, dizziness, palpitations, syncope.   GI  No heartburn, indigestion, abdominal pain, nausea, vomiting, diarrhea,  change in bowel habits, loss of appetite, bloody stools.   Resp:    No excess mucus, no productive cough,  No non-productive cough,  No coughing up of blood.  No change in color of mucus.  No wheezing.  No chest wall deformity  Skin: no rash or lesions.  GU: no dysuria, change in color of urine, no urgency or frequency.  No flank pain, no hematuria   MS:  No joint pain or swelling.  No decreased range of motion.  No back pain.    Physical Exam  BP 124/78 (BP Location: Left Arm, Cuff Size: Normal)   Pulse 82   Temp 98.1 F (36.7 C)   Ht 5\' 5"  (1.651 m)   Wt 147 lb (66.7 kg)   SpO2 100%   BMI 24.46 kg/m   GEN: A/Ox3; pleasant , NAD, well nourished    HEENT:  Wright/AT,   NOSE-clear, THROAT-clear, no lesions, no postnasal drip or exudate noted.   NECK:  Supple w/ fair ROM; no JVD; normal carotid impulses w/o bruits; no thyromegaly or nodules palpated; no lymphadenopathy.    RESP  Clear  P & A; w/o, wheezes/ rales/ or rhonchi. no accessory muscle use, no dullness to percussion  CARD:  RRR, no m/r/g, no peripheral edema, pulses intact, no cyanosis or clubbing.  GI:   Soft & nt; nml bowel sounds; no organomegaly or masses detected.   Musco: Warm bil, no deformities or joint swelling noted.   Neuro: alert, no focal deficits noted.    Skin: Warm, no lesions or rashes    Lab Results:  CBC  BNP No results found for: BNP  ProBNP No results found for: PROBNP  Imaging: No results found.    No flowsheet data found.  Lab Results  Component Value Date   NITRICOXIDE 30 04/17/2018        Assessment & Plan:   Chronic asthma, mild persistent, uncomplicated Currently well controlled.  Continue on current regimen. Allergy referral placed as patient missed last referral due to surgery  Plan  . Patient Instructions  Continue on Symbicort 2 puffs Twice daily, rinse after use.  Begin Claritin 10mg  daily  Saline saline spray As needed   Albuterol inhaler As needed    Refer to allergist .  Follow up with Dr. 06/18/2018  In 6 months and As needed        Rhinitis, chronic Trigger prevention discussed.  Add in Claritin daily.     , NP 12/18/2020

## 2020-12-18 NOTE — Patient Instructions (Signed)
Continue on Symbicort 2 puffs Twice daily, rinse after use.  Begin Claritin 10mg  daily  Saline saline spray As needed   Albuterol inhaler As needed   Refer to allergist .  Follow up with Dr.  In 6 months and As needed

## 2020-12-18 NOTE — Assessment & Plan Note (Signed)
Currently well controlled.  Continue on current regimen. Allergy referral placed as patient missed last referral due to surgery  Plan  . Patient Instructions  Continue on Symbicort 2 puffs Twice daily, rinse after use.  Begin Claritin 10mg  daily  Saline saline spray As needed   Albuterol inhaler As needed   Refer to allergist .  Follow up with Dr.  In 6 months and As needed

## 2021-03-01 ENCOUNTER — Ambulatory Visit: Payer: Self-pay | Admitting: Allergy

## 2021-03-01 ENCOUNTER — Other Ambulatory Visit: Payer: Self-pay

## 2021-03-01 NOTE — Progress Notes (Deleted)
New Patient Note  RE: Lori Black MRN: [id number] DOB: 11-25-1985 Date of Office Visit: 03/01/2021  Consult requested by: Julio Sicks, NP Primary care provider: Patient, No Pcp Per (Inactive)  Chief Complaint: No chief complaint on file.  History of Present Illness: I had the pleasure of seeing Lori Black for initial evaluation at the [facility] on 03/01/2021. She is a 35 y.o. female, who is referred here by Patient, No Pcp Per (Inactive) for the evaluation of allergic rhinitis.  She reports symptoms of ***. Symptoms have been going on for *** years. The symptoms are present *** all year around with worsening in ***. Other triggers include exposure to ***. Anosmia: ***. Headache: ***. She has used *** with ***fair improvement in symptoms. Sinus infections: ***. Previous work up includes: ***. Previous ENT evaluation: ***. Previous sinus imaging: ***. History of nasal polyps: ***. Last eye exam: ***. History of reflux: ***.  Component     Latest Ref Rng & Units 04/17/2018        11:11 AM  Allergen, D pternoyssinus,d7     kU/L 12.50 (H)  Class      3  D. farinae     kU/L 12.10 (H)  Allergen, P. notatum, m1     kU/L <0.10  CLADOSPORIUM HERBARUM (M2) IGE     kU/L <0.10  Aspergillus fumigatus, m3     kU/L <0.10  Allergen, A. alternata, m6     kU/L <0.10  Cat Dander     kU/L 38.40 (H)  Dog Dander     kU/L 2.55 (H)  Cockroach     kU/L <0.10  Box Elder IgE     kU/L <0.10  Allergen, Comm Silver Charletta Cousin, t9     kU/L <0.10  Allergen, Cedar tree, t12     kU/L <0.10  Allergen, Cottonwood, t14     kU/L <0.10  Allergen, Oak,t7     kU/L 4.88 (H)  Elm IgE     kU/L <0.10  Pecan/Hickory Tree IgE     kU/L <0.10  Allergen, Mulberry, t76     kU/L <0.10  [place] Grass     kU/L <0.10  Timothy Grass     kU/L <0.10  Johnson Grass     kU/L <0.10  COMMON RAGWEED (SHORT) (W1) IGE     kU/L <0.10  Rough Pigweed  IgE     kU/L <0.10  Sheep Sorrel IgE      kU/L <0.10  Allergen, Mouse Urine Protein, e78     kU/L 0.80 (H)  IgE (Immunoglobulin E), Serum     <OR=114 kU/L 158 (H)    Assessment and Plan: Lori Black is a 35 y.o. female with: No problem-specific Assessment & Plan notes found for this encounter.  No follow-ups on file.  No orders of the defined types were placed in this encounter.  Lab Orders  No laboratory test(s) ordered today    Other allergy screening: Asthma: {Blank single:19197::"yes","no"} Rhino conjunctivitis: {Blank single:19197::"yes","no"} Food allergy: {Blank single:19197::"yes","no"} Medication allergy: {Blank single:19197::"yes","no"} Hymenoptera allergy: {Blank single:19197::"yes","no"} Urticaria: {Blank single:19197::"yes","no"} Eczema:{Blank single:19197::"yes","no"} History of recurrent infections suggestive of immunodeficency: {Blank single:19197::"yes","no"}  Diagnostics: Spirometry:  Tracings reviewed. Her effort: {Blank single:19197::"Good reproducible efforts.","It was hard to get consistent efforts and there is a question as to whether this reflects a maximal maneuver.","Poor effort, data can not be interpreted."} FVC: ***L FEV1: ***L, ***% predicted FEV1/FVC ratio: ***% Interpretation: {Blank single:19197::"Spirometry consistent with mild obstructive disease","Spirometry consistent with moderate obstructive disease","Spirometry  consistent with severe obstructive disease","Spirometry consistent with possible restrictive disease","Spirometry consistent with mixed obstructive and restrictive disease","Spirometry uninterpretable due to technique","Spirometry consistent with normal pattern","No overt abnormalities noted given today's efforts"}.  Please see scanned spirometry results for details.  Skin Testing: {Blank single:19197::"Select foods","Environmental allergy panel","Environmental allergy panel and select foods","Food allergy panel","None","Deferred due to recent antihistamines use"}. Positive  test to: ***. Negative test to: ***.  Results discussed with patient/family.   Past Medical History: Patient Active Problem List   Diagnosis Date Noted  . Rhinitis, chronic 07/31/2018  . Chest pain, musculoskeletal 07/19/2017  . Chronic asthma, mild persistent, uncomplicated 04/17/2017  . Asthma with acute exacerbation 10/11/2016  . Microcytic anemia 10/11/2016  . Influenza A 10/11/2016  . Acute respiratory failure (HCC) 10/09/2016   Past Medical History:  Diagnosis Date  . Asthma    Past Surgical History: Past Surgical History:  Procedure Laterality Date  . CESAREAN SECTION    . WRIST ARTHROSCOPY WITH DEBRIDEMENT Right 07/16/2020   Procedure: WRIST ARTHROSCOPY WITH DEBRIDEMENT TRIANGULAR FIBRO CARTILAGE REPAIR SOFT TISSUE WITH EXTENSOR CARPAL RADIALIS STABILIZATION ;  Surgeon: Betha Loa, MD;  Location: [facility];  Service: Orthopedics;  Laterality: Right;  . WRIST ARTHROSCOPY WITH FOVEAL TRIANGULAR FIBROCARTILAGE COMPLEX REPAIR Right 07/16/2020   Procedure: WRIST ARTHROSCOPY WITH FOVEAL TRIANGULAR FIBROCARTILAGE COMPLEX REPAIR;  Surgeon: Betha Loa, MD;  Location: [facility];  Service: Orthopedics;  Laterality: Right;   Medication List:  Current Outpatient Medications  Medication Sig Dispense Refill  . albuterol (PROVENTIL) (2.5 MG/3ML) 0.083% nebulizer solution Take 3 mLs (2.5 mg total) by nebulization every 4 (four) hours as needed for wheezing or shortness of breath. 150 mL 3  . albuterol (VENTOLIN HFA) 108 (90 Base) MCG/ACT inhaler Inhale 2 puffs into the lungs every 4 (four) hours as needed for wheezing or shortness of breath. 18 g 3  . Budeson-Glycopyrrol-Formoterol (BREZTRI AEROSPHERE) 160-9-4.8 MCG/ACT AERO Inhale 2 puffs into the lungs 2 (two) times daily. (Patient not taking: Reported on 12/18/2020) 10.7 g 0  . budesonide-formoterol (SYMBICORT) 160-4.5 MCG/ACT inhaler Inhale 2 puffs into the lungs 2 (two) times daily. 1 each 3  .  HYDROcodone-acetaminophen (NORCO) 5-325 MG tablet 1-2 tabs po q6 hours prn pain (Patient not taking: Reported on 12/18/2020) 30 tablet 0   No current facility-administered medications for this visit.   Allergies: No Known Allergies Social History: Social History   Socioeconomic History  . Marital status: Married    Spouse name: Not on file  . Number of children: Not on file  . Years of education: Not on file  . Highest education level: Not on file  Occupational History  . Not on file  Tobacco Use  . Smoking status: Never Smoker  . Smokeless tobacco: Never Used  Vaping Use  . Vaping Use: Never used  Substance and Sexual Activity  . Alcohol use: Yes    Comment: occassionally  . Drug use: No  . Sexual activity: Not on file  Other Topics Concern  . Not on file  Social History Narrative  . Not on file   Social Determinants of Health   Financial Resource Strain: Not on file  Food Insecurity: Not on file  Transportation Needs: Not on file  Physical Activity: Not on file  Stress: Not on file  Social Connections: Not on file   Lives in a ***. Smoking: *** Occupation: ***  Environmental History: Water Damage/mildew in the house: Copywriter, advertising in the family room: {Blank single:19197::"yes","no"} Engineer, civil (consulting) in  the bedroom: {Blank single:19197::"yes","no"} Heating: {Blank single:19197::"electric","gas","heat pump"} Cooling: {Blank single:19197::"central","window","heat pump"} Pet: {Blank single:19197::"yes ***","no"}  Family History: Family History  Problem Relation Age of Onset  . Diabetes Mother    Problem                               Relation Asthma                                   *** Eczema                                *** Food allergy                          *** Allergic rhino conjunctivitis     ***  Review of Systems  Constitutional: Negative for appetite change, chills, fever and unexpected weight change.  HENT: Negative for  congestion and rhinorrhea.   Eyes: Negative for itching.  Respiratory: Negative for cough, chest tightness, shortness of breath and wheezing.   Cardiovascular: Negative for chest pain.  Gastrointestinal: Negative for abdominal pain.  Genitourinary: Negative for difficulty urinating.  Skin: Negative for rash.  Neurological: Negative for headaches.   Objective: There were no vitals taken for this visit. There is no height or weight on file to calculate BMI. Physical Exam Vitals and nursing note reviewed.  Constitutional:      Appearance: Normal appearance. She is well-developed.  HENT:     Head: Normocephalic and atraumatic.     Right Ear: External ear normal.     Left Ear: External ear normal.     Nose: Nose normal.     Mouth/Throat:     Mouth: Mucous membranes are moist.     Pharynx: Oropharynx is clear.  Eyes:     Conjunctiva/sclera: Conjunctivae normal.  Cardiovascular:     Rate and Rhythm: Normal rate and regular rhythm.     Heart sounds: Normal heart sounds. No murmur heard. No friction rub. No gallop.   Pulmonary:     Effort: Pulmonary effort is normal.     Breath sounds: Normal breath sounds. No wheezing, rhonchi or rales.  Abdominal:     Palpations: Abdomen is soft.  Musculoskeletal:     Cervical back: Neck supple.  Skin:    General: Skin is warm.     Findings: No rash.  Neurological:     Mental Status: She is alert and oriented to person, place, and time.  Psychiatric:        Behavior: Behavior normal.    The plan was reviewed with the patient/family, and all questions/concerned were addressed.  It was my pleasure to see Konnie today and participate in her care. Please feel free to contact me with any questions or concerns.  Sincerely,  Wyline Mood, DO Allergy & Immunology  [facility] office: [phone] [facility] office: [phone]

## 2021-03-12 ENCOUNTER — Encounter (HOSPITAL_COMMUNITY): Payer: Self-pay

## 2021-03-12 ENCOUNTER — Emergency Department (HOSPITAL_COMMUNITY)
Admission: EM | Admit: 2021-03-12 | Discharge: 2021-03-12 | Disposition: A | Discharge status: Home / Self Care | Payer: Medicare Other | Attending: Emergency Medicine | Admitting: Emergency Medicine

## 2021-03-12 ENCOUNTER — Emergency Department (HOSPITAL_COMMUNITY): Payer: Medicare Other

## 2021-03-12 ENCOUNTER — Other Ambulatory Visit: Payer: Self-pay

## 2021-03-12 DIAGNOSIS — J4521 Mild intermittent asthma with (acute) exacerbation: Secondary | ICD-10-CM | POA: Diagnosis not present

## 2021-03-12 DIAGNOSIS — B9789 Other viral agents as the cause of diseases classified elsewhere: Secondary | ICD-10-CM | POA: Diagnosis not present

## 2021-03-12 DIAGNOSIS — J45901 Unspecified asthma with (acute) exacerbation: Secondary | ICD-10-CM | POA: Diagnosis not present

## 2021-03-12 DIAGNOSIS — J069 Acute upper respiratory infection, unspecified: Secondary | ICD-10-CM | POA: Diagnosis not present

## 2021-03-12 DIAGNOSIS — U071 COVID-19: Secondary | ICD-10-CM | POA: Insufficient documentation

## 2021-03-12 DIAGNOSIS — J029 Acute pharyngitis, unspecified: Secondary | ICD-10-CM | POA: Diagnosis present

## 2021-03-12 MED ORDER — PREDNISONE 20 MG PO TABS: 20.0000 mg | ORAL_TABLET | Freq: Every day | ORAL | 0 refills | Status: AC

## 2021-03-12 MED ORDER — PREDNISONE 20 MG PO TABS
60.0000 mg | ORAL_TABLET | Freq: Once | ORAL | Status: AC
  Administered 2021-03-12: 60 mg via ORAL
  Filled 2021-03-12: qty 3

## 2021-03-12 MED ORDER — IPRATROPIUM BROMIDE HFA 17 MCG/ACT IN AERS
2.0000 | INHALATION_SPRAY | Freq: Once | RESPIRATORY_TRACT | Status: AC
  Administered 2021-03-12: 2 via RESPIRATORY_TRACT
  Filled 2021-03-12: qty 12.9

## 2021-03-12 NOTE — ED Triage Notes (Signed)
Pt reports sore throat a congestion for a few days. Pt reports hx of asthma and states that she has been using more breathing treatments than normal.

## 2021-03-12 NOTE — ED Provider Notes (Signed)
[facility]-EMERGENCY DEPT Provider Note   CSN: [id number] Arrival date & time: 03/12/21  2148     History Chief Complaint  Patient presents with  . Sore Throat    Lori Black is a 35 y.o. female history of asthma, presenting for evaluation of URI that began on Saturday.  Initially had some mild sore throat.  She has now chest congestion with cough, as well as nasal congestion.  Her asthma symptoms are exacerbated, worse at night.  She has some wheezing.  No fevers.  No known COVID exposures.  She has had 2 COVID vaccines.  She has been treating with nebulizers at home, as well as albuterol and her daily Symbicort.  Albuterol gives some relief.  She is also treating with over-the-counter cold and antihistamine medications.    The history is provided by the patient.       Past Medical History:  Diagnosis Date  . Asthma     Patient Active Problem List   Diagnosis Date Noted  . Rhinitis, chronic 07/31/2018  . Chest pain, musculoskeletal 07/19/2017  . Chronic asthma, mild persistent, uncomplicated 04/17/2017  . Asthma with acute exacerbation 10/11/2016  . Microcytic anemia 10/11/2016  . Influenza A 10/11/2016  . Acute respiratory failure (HCC) 10/09/2016    Past Surgical History:  Procedure Laterality Date  . CESAREAN SECTION    . WRIST ARTHROSCOPY WITH DEBRIDEMENT Right 07/16/2020   Procedure: WRIST ARTHROSCOPY WITH DEBRIDEMENT TRIANGULAR FIBRO CARTILAGE REPAIR SOFT TISSUE WITH EXTENSOR CARPAL RADIALIS STABILIZATION ;  Surgeon: Betha Loa, MD;  Location: [facility];  Service: Orthopedics;  Laterality: Right;  . WRIST ARTHROSCOPY WITH FOVEAL TRIANGULAR FIBROCARTILAGE COMPLEX REPAIR Right 07/16/2020   Procedure: WRIST ARTHROSCOPY WITH FOVEAL TRIANGULAR FIBROCARTILAGE COMPLEX REPAIR;  Surgeon: Betha Loa, MD;  Location: [facility];  Service: Orthopedics;  Laterality: Right;     OB History   No obstetric history on file.      Family History  Problem Relation Age of Onset  . Diabetes Mother     Social History   Tobacco Use  . Smoking status: Never Smoker  . Smokeless tobacco: Never Used  Vaping Use  . Vaping Use: Never used  Substance Use Topics  . Alcohol use: Yes    Comment: occassionally  . Drug use: No    Home Medications Prior to Admission medications   Medication Sig Start Date End Date Taking? Authorizing Provider  predniSONE (DELTASONE) 20 MG tablet Take 1 tablet (20 mg total) by mouth daily for 5 days. 03/13/21 03/18/21 Yes Roxan Hockey, [place] N, PA-C  albuterol (PROVENTIL) (2.5 MG/3ML) 0.083% nebulizer solution Take 3 mLs (2.5 mg total) by nebulization every 4 (four) hours as needed for wheezing or shortness of breath. 12/18/20   Parrett, Virgel Bouquet, NP  albuterol (VENTOLIN HFA) 108 (90 Base) MCG/ACT inhaler Inhale 2 puffs into the lungs every 4 (four) hours as needed for wheezing or shortness of breath. 12/18/20   Parrett, Virgel Bouquet, NP  Budeson-Glycopyrrol-Formoterol (BREZTRI AEROSPHERE) 160-9-4.8 MCG/ACT AERO Inhale 2 puffs into the lungs 2 (two) times daily. Patient not taking: Reported on 12/18/2020 07/14/20   Nyoka Cowden, MD  budesonide-formoterol [facility]) 160-4.5 MCG/ACT inhaler Inhale 2 puffs into the lungs 2 (two) times daily. 12/18/20   Parrett, Virgel Bouquet, NP  HYDROcodone-acetaminophen (NORCO) 5-325 MG tablet 1-2 tabs po q6 hours prn pain Patient not taking: Reported on 12/18/2020 07/16/20   Betha Loa, MD    Allergies    Patient has no  known allergies.  Review of Systems   Review of Systems  Constitutional: Negative for fever.  HENT: Positive for congestion.   Respiratory: Positive for cough, shortness of breath and wheezing.   All other systems reviewed and are negative.   Physical Exam Updated Vital Signs BP 132/83 (BP Location: Left Arm)   Pulse 96   Temp 99.5 F (37.5 C) (Oral)   Resp 18   Ht 5\' 5"  (1.651 m)   Wt 68 kg   LMP 02/19/2021   SpO2 95%   BMI 24.96 kg/m    Physical Exam Vitals and nursing note reviewed.  Constitutional:      General: She is not in acute distress.    Appearance: She is well-developed. She is not ill-appearing.  HENT:     Head: Normocephalic and atraumatic.     Mouth/Throat:     Mouth: Mucous membranes are moist.     Pharynx: Oropharynx is clear. No oropharyngeal exudate or posterior oropharyngeal erythema.  Eyes:     Conjunctiva/sclera: Conjunctivae normal.  Cardiovascular:     Rate and Rhythm: Normal rate and regular rhythm.  Pulmonary:     Effort: Pulmonary effort is normal. No respiratory distress.     Breath sounds: Normal breath sounds.  Abdominal:     Palpations: Abdomen is soft.  Musculoskeletal:     Cervical back: Normal range of motion and neck supple.  Skin:    General: Skin is warm.  Neurological:     Mental Status: She is alert.  Psychiatric:        Behavior: Behavior normal.     ED Results / Procedures / Treatments   Labs (all labs ordered are listed, but only abnormal results are displayed) Labs Reviewed  RESP PANEL BY RT-PCR (FLU A&B, COVID) ARPGX2    EKG None  Radiology DG Chest Port 1 View  Result Date: 03/12/2021 CLINICAL DATA:  Asthma and upper respiratory infection. EXAM: PORTABLE CHEST 1 VIEW COMPARISON:  Radiograph 03/07/2017 FINDINGS: Azygos fissure is again seen, incidental.The cardiomediastinal contours are normal. The lungs are clear. Pulmonary vasculature is normal. No consolidation, pleural effusion, or pneumothorax. No acute osseous abnormalities are seen. IMPRESSION: No acute chest findings. Electronically Signed   By: 03/09/2017 M.D.   On: 03/12/2021 23:00    Procedures Procedures   Medications Ordered in ED Medications  ipratropium (ATROVENT HFA) inhaler 2 puff (2 puffs Inhalation Given 03/12/21 2309)  predniSONE (DELTASONE) tablet 60 mg (60 mg Oral Given 03/12/21 2309)    ED Course  I have reviewed the triage vital signs and the nursing notes.  Pertinent  labs & imaging results that were available during my care of the patient were reviewed by me and considered in my medical decision making (see chart for details).    MDM Rules/Calculators/A&P                          Patients symptoms are consistent with URI, likely viral etiology. Afebrile, tolerating secretions.  Lungs clear to auscultation bilaterally. CXR negative for acute infiltrate.  URI is exacerbating her asthma, worse at night.  She is given prednisone here with ipratropium.  She has her albuterol.  Will discharge with prednisone burst, continue albuterol every 4 hours as needed.  Can use ipratropium every 6 as needed.  COVID swab and influenza swab is pending at discharge.  She is breathing at her baseline, O2 saturation is excellent on room air.  Discussed that antibiotics are  not indicated for viral infections. Pt will be discharged with symptomatic treatment.  Verbalizes understanding and is agreeable with plan. Pt is hemodynamically stable & in NAD prior to dc.  Discussed results, findings, treatment and follow up. Patient advised of return precautions. Patient verbalized understanding and agreed with plan.  Lori Black was evaluated in Emergency Department on 03/12/2021 for the symptoms described in the history of present illness. She was evaluated in the context of the global COVID-19 pandemic, which necessitated consideration that the patient might be at risk for infection with the SARS-CoV-2 virus that causes COVID-19. Institutional protocols and algorithms that pertain to the evaluation of patients at risk for COVID-19 are in a state of rapid change based on information released by regulatory bodies including the CDC and federal and state organizations. These policies and algorithms were followed during the patient's care in the ED.   Final Clinical Impression(s) / ED Diagnoses Final diagnoses:  Viral URI with cough  Exacerbation of intermittent asthma, unspecified asthma severity     Rx / DC Orders ED Discharge Orders         Ordered    predniSONE (DELTASONE) 20 MG tablet  Daily        03/12/21 2309           Shakeitha Umbaugh, [place] N, PA-C 03/12/21 2310    Gwyneth Sprout, MD 03/15/21 [phone]

## 2021-03-12 NOTE — Discharge Instructions (Addendum)
Your chest xray is normal. Use your albuterol inhaler every 4-6 hours as needed for shortness of breath or wheezing.  Starting tomorrow, begin taking the prednisone, as prescribed, until it is gone.  You can alternate Tylenol/acetaminophen and Advil/ibuprofen/Motrin every 4 hours for sore throat, body aches, headache or fever.  Drink plenty of water.  Use saline nasal spray for congestion. Wash your hands frequently. You have a COVID test pending. Please isolate at home while awaiting your results.  You can follow your results on MyChart. > If your test is negative, stay home until your fever has resolved/your symptoms are improving. > If your test is positive, isolate at home for at least 5 days after the day your symptoms initially began, and THEN at least 24 hours after you are fever-free without the help of medications AND your symptoms are improving. Only once your symptoms are improving and you are fever-free can you come out out of quarantine. Once, then you should wear a mask in public for another 5 days. Follow up with your primary care provider. Return to the ER for shortness of breath not improved by your inhaler, uncontrollable vomiting, severe chest pain, or other concerning symptoms.

## 2021-03-13 LAB — RESP PANEL BY RT-PCR (FLU A&B, COVID) ARPGX2
Influenza A by PCR: NEGATIVE
Influenza B by PCR: NEGATIVE
SARS Coronavirus 2 by RT PCR: POSITIVE — AB

## 2021-04-29 ENCOUNTER — Inpatient Hospital Stay (HOSPITAL_COMMUNITY)
Admission: EM | Admit: 2021-04-29 | Discharge: 2021-05-05 | DRG: 550 | Disposition: A | Discharge status: Home Health | Payer: Medicare Other | Attending: Internal Medicine | Admitting: Internal Medicine

## 2021-04-29 ENCOUNTER — Encounter (HOSPITAL_COMMUNITY): Payer: Self-pay

## 2021-04-29 ENCOUNTER — Other Ambulatory Visit: Payer: Self-pay

## 2021-04-29 ENCOUNTER — Emergency Department (HOSPITAL_COMMUNITY): Payer: Medicare Other

## 2021-04-29 DIAGNOSIS — K029 Dental caries, unspecified: Secondary | ICD-10-CM | POA: Diagnosis present

## 2021-04-29 DIAGNOSIS — M00062 Staphylococcal arthritis, left knee: Secondary | ICD-10-CM | POA: Diagnosis present

## 2021-04-29 DIAGNOSIS — Z7951 Long term (current) use of inhaled steroids: Secondary | ICD-10-CM | POA: Diagnosis not present

## 2021-04-29 DIAGNOSIS — Z79899 Other long term (current) drug therapy: Secondary | ICD-10-CM | POA: Diagnosis not present

## 2021-04-29 DIAGNOSIS — M009 Pyogenic arthritis, unspecified: Secondary | ICD-10-CM | POA: Diagnosis present

## 2021-04-29 DIAGNOSIS — S0240DA Maxillary fracture, left side, initial encounter for closed fracture: Secondary | ICD-10-CM | POA: Diagnosis not present

## 2021-04-29 DIAGNOSIS — D509 Iron deficiency anemia, unspecified: Secondary | ICD-10-CM | POA: Diagnosis present

## 2021-04-29 DIAGNOSIS — M1711 Unilateral primary osteoarthritis, right knee: Secondary | ICD-10-CM | POA: Diagnosis not present

## 2021-04-29 DIAGNOSIS — A419 Sepsis, unspecified organism: Secondary | ICD-10-CM | POA: Diagnosis not present

## 2021-04-29 DIAGNOSIS — M25462 Effusion, left knee: Secondary | ICD-10-CM | POA: Diagnosis not present

## 2021-04-29 DIAGNOSIS — B957 Other staphylococcus as the cause of diseases classified elsewhere: Secondary | ICD-10-CM | POA: Diagnosis present

## 2021-04-29 DIAGNOSIS — Z20822 Contact with and (suspected) exposure to covid-19: Secondary | ICD-10-CM | POA: Diagnosis present

## 2021-04-29 DIAGNOSIS — M00061 Staphylococcal arthritis, right knee: Secondary | ICD-10-CM | POA: Diagnosis present

## 2021-04-29 DIAGNOSIS — M25461 Effusion, right knee: Secondary | ICD-10-CM | POA: Diagnosis not present

## 2021-04-29 DIAGNOSIS — M00861 Arthritis due to other bacteria, right knee: Secondary | ICD-10-CM | POA: Diagnosis not present

## 2021-04-29 DIAGNOSIS — M00862 Arthritis due to other bacteria, left knee: Secondary | ICD-10-CM | POA: Diagnosis not present

## 2021-04-29 DIAGNOSIS — M7989 Other specified soft tissue disorders: Secondary | ICD-10-CM | POA: Diagnosis not present

## 2021-04-29 DIAGNOSIS — J9600 Acute respiratory failure, unspecified whether with hypoxia or hypercapnia: Secondary | ICD-10-CM | POA: Diagnosis not present

## 2021-04-29 DIAGNOSIS — J4530 Mild persistent asthma, uncomplicated: Secondary | ICD-10-CM | POA: Diagnosis present

## 2021-04-29 DIAGNOSIS — Z833 Family history of diabetes mellitus: Secondary | ICD-10-CM | POA: Diagnosis not present

## 2021-04-29 DIAGNOSIS — E876 Hypokalemia: Secondary | ICD-10-CM | POA: Clinically undetermined

## 2021-04-29 DIAGNOSIS — J341 Cyst and mucocele of nose and nasal sinus: Secondary | ICD-10-CM | POA: Diagnosis not present

## 2021-04-29 LAB — CBC WITH DIFFERENTIAL/PLATELET
Abs Immature Granulocytes: 0.03 10*3/uL (ref 0.00–0.07)
Basophils Absolute: 0 10*3/uL (ref 0.0–0.1)
Basophils Relative: 0 %
Eosinophils Absolute: 0.1 10*3/uL (ref 0.0–0.5)
Eosinophils Relative: 1 %
HCT: 29.1 % — ABNORMAL LOW (ref 36.0–46.0)
Hemoglobin: 8.6 g/dL — ABNORMAL LOW (ref 12.0–15.0)
Immature Granulocytes: 0 %
Lymphocytes Relative: 18 %
Lymphs Abs: 1.6 10*3/uL (ref 0.7–4.0)
MCH: 20.8 pg — ABNORMAL LOW (ref 26.0–34.0)
MCHC: 29.6 g/dL — ABNORMAL LOW (ref 30.0–36.0)
MCV: 70.3 fL — ABNORMAL LOW (ref 80.0–100.0)
Monocytes Absolute: 0.8 10*3/uL (ref 0.1–1.0)
Monocytes Relative: 8 %
Neutro Abs: 6.6 10*3/uL (ref 1.7–7.7)
Neutrophils Relative %: 73 %
Platelets: 259 10*3/uL (ref 150–400)
RBC: 4.14 MIL/uL (ref 3.87–5.11)
RDW: 16.3 % — ABNORMAL HIGH (ref 11.5–15.5)
WBC: 9.1 10*3/uL (ref 4.0–10.5)
nRBC: 0 % (ref 0.0–0.2)

## 2021-04-29 LAB — COMPREHENSIVE METABOLIC PANEL
ALT: 13 U/L (ref 0–44)
AST: 15 U/L (ref 15–41)
Albumin: 4.1 g/dL (ref 3.5–5.0)
Alkaline Phosphatase: 63 U/L (ref 38–126)
Anion gap: 12 (ref 5–15)
BUN: 11 mg/dL (ref 6–20)
CO2: 27 mmol/L (ref 22–32)
Calcium: 8.9 mg/dL (ref 8.9–10.3)
Chloride: 97 mmol/L — ABNORMAL LOW (ref 98–111)
Creatinine, Ser: 0.66 mg/dL (ref 0.44–1.00)
GFR, Estimated: >60 mL/min (ref >60)
Glucose, Bld: 187 mg/dL — ABNORMAL HIGH (ref 70–99)
Potassium: 2.9 mmol/L — ABNORMAL LOW (ref 3.5–5.1)
Sodium: 136 mmol/L (ref 135–145)
Total Bilirubin: 1.2 mg/dL (ref 0.3–1.2)
Total Protein: 7.3 g/dL (ref 6.5–8.1)

## 2021-04-29 NOTE — ED Provider Notes (Signed)
Emergency Medicine Provider Triage Evaluation Note  Lori Black , a 35 y.o. female  was evaluated in triage.  Pt complains of bilateral atraumatic knee pain.  Patient states she had pain and swelling in the right knee that started yesterday.  Today she began experiencing similar symptoms in the left knee.  Pain worsens when bearing weight.  Reports an associated fever.  She states her T-max was 33 F just prior to arrival.  No history of similar symptoms.  No chest pain, shortness of breath, URI symptoms, nausea, vomiting.  Physical Exam  BP 139/90 (BP Location: Left Arm)   Pulse 93   Temp 99.4 F (37.4 C) (Oral)   Resp 18   Ht 5\' 5"  (1.651 m)   Wt 68 kg   SpO2 100%   BMI 24.96 kg/m  Gen:   Awake, no distress   Resp:  Normal effort  MSK:   Moves extremities without difficulty  Other:    Medical Decision Making  Medically screening exam initiated at 10:31 PM.  Appropriate orders placed.  Lori Black was informed that the remainder of the evaluation will be completed by another provider, this initial triage assessment does not replace that evaluation, and the importance of remaining in the ED until their evaluation is complete.   , PA-C 04/29/21 2233    2234, MD 04/29/21 [phone]

## 2021-04-30 ENCOUNTER — Inpatient Hospital Stay (HOSPITAL_COMMUNITY): Payer: Medicare Other | Admitting: Anesthesiology

## 2021-04-30 ENCOUNTER — Encounter (HOSPITAL_COMMUNITY)
Admission: EM | Disposition: A | Discharge status: Home Health | Payer: Self-pay | Source: Home / Self Care | Attending: Internal Medicine

## 2021-04-30 ENCOUNTER — Encounter (HOSPITAL_COMMUNITY): Payer: Self-pay | Admitting: Internal Medicine

## 2021-04-30 DIAGNOSIS — M009 Pyogenic arthritis, unspecified: Secondary | ICD-10-CM | POA: Diagnosis present

## 2021-04-30 DIAGNOSIS — M00061 Staphylococcal arthritis, right knee: Secondary | ICD-10-CM | POA: Diagnosis present

## 2021-04-30 DIAGNOSIS — J4530 Mild persistent asthma, uncomplicated: Secondary | ICD-10-CM | POA: Diagnosis present

## 2021-04-30 DIAGNOSIS — E876 Hypokalemia: Secondary | ICD-10-CM | POA: Diagnosis present

## 2021-04-30 DIAGNOSIS — D509 Iron deficiency anemia, unspecified: Secondary | ICD-10-CM | POA: Diagnosis present

## 2021-04-30 DIAGNOSIS — M00062 Staphylococcal arthritis, left knee: Secondary | ICD-10-CM | POA: Diagnosis present

## 2021-04-30 DIAGNOSIS — Z79899 Other long term (current) drug therapy: Secondary | ICD-10-CM | POA: Diagnosis not present

## 2021-04-30 DIAGNOSIS — B957 Other staphylococcus as the cause of diseases classified elsewhere: Secondary | ICD-10-CM | POA: Diagnosis present

## 2021-04-30 DIAGNOSIS — Z20822 Contact with and (suspected) exposure to covid-19: Secondary | ICD-10-CM | POA: Diagnosis present

## 2021-04-30 DIAGNOSIS — K029 Dental caries, unspecified: Secondary | ICD-10-CM | POA: Diagnosis present

## 2021-04-30 DIAGNOSIS — Z7951 Long term (current) use of inhaled steroids: Secondary | ICD-10-CM | POA: Diagnosis not present

## 2021-04-30 DIAGNOSIS — Z833 Family history of diabetes mellitus: Secondary | ICD-10-CM | POA: Diagnosis not present

## 2021-04-30 HISTORY — PX: KNEE ARTHROSCOPY: SHX127

## 2021-04-30 LAB — URINALYSIS, ROUTINE W REFLEX MICROSCOPIC
Bilirubin Urine: NEGATIVE
Glucose, UA: NEGATIVE mg/dL
Ketones, ur: 5 mg/dL — AB
Leukocytes,Ua: NEGATIVE
Nitrite: NEGATIVE
Protein, ur: NEGATIVE mg/dL
Specific Gravity, Urine: 1.013 (ref 1.005–1.030)
pH: 5 (ref 5.0–8.0)

## 2021-04-30 LAB — RESP PANEL BY RT-PCR (FLU A&B, COVID) ARPGX2
Influenza A by PCR: NEGATIVE
Influenza B by PCR: NEGATIVE
SARS Coronavirus 2 by RT PCR: NEGATIVE

## 2021-04-30 LAB — RAPID URINE DRUG SCREEN, HOSP PERFORMED
Amphetamines: NOT DETECTED
Barbiturates: NOT DETECTED
Benzodiazepines: NOT DETECTED
Cocaine: NOT DETECTED
Opiates: NOT DETECTED
Tetrahydrocannabinol: NOT DETECTED

## 2021-04-30 LAB — SURGICAL PCR SCREEN
MRSA, PCR: NEGATIVE
Staphylococcus aureus: POSITIVE — AB

## 2021-04-30 LAB — SYNOVIAL CELL COUNT + DIFF, W/ CRYSTALS
Crystals, Fluid: NONE SEEN
Lymphocytes-Synovial Fld: 1 % (ref 0–20)
Monocyte-Macrophage-Synovial Fluid: 17 % — ABNORMAL LOW (ref 50–90)
Neutrophil, Synovial: 82 % — ABNORMAL HIGH (ref 0–25)
WBC, Synovial: 1350 /mm3 — ABNORMAL HIGH (ref 0–200)

## 2021-04-30 LAB — CK: Total CK: 43 U/L (ref 38–234)

## 2021-04-30 LAB — SEDIMENTATION RATE: Sed Rate: 29 mm/hr — ABNORMAL HIGH (ref 0–22)

## 2021-04-30 LAB — C-REACTIVE PROTEIN: CRP: 0.8 mg/dL (ref <1.0)

## 2021-04-30 LAB — PREGNANCY, URINE: Preg Test, Ur: NEGATIVE

## 2021-04-30 LAB — URIC ACID: Uric Acid, Serum: 4 mg/dL (ref 2.5–7.1)

## 2021-04-30 SURGERY — ARTHROSCOPY, KNEE, BILATERAL
Anesthesia: General | Site: Knee | Laterality: Bilateral

## 2021-04-30 MED ORDER — SUCCINYLCHOLINE CHLORIDE 200 MG/10ML IV SOSY
PREFILLED_SYRINGE | INTRAVENOUS | Status: AC
  Filled 2021-04-30: qty 10

## 2021-04-30 MED ORDER — HYDROCODONE-ACETAMINOPHEN 5-325 MG PO TABS
1.0000 | ORAL_TABLET | Freq: Once | ORAL | Status: AC
  Administered 2021-04-30: 1 via ORAL
  Filled 2021-04-30: qty 1

## 2021-04-30 MED ORDER — ALBUTEROL SULFATE HFA 108 (90 BASE) MCG/ACT IN AERS: 2.0000 | INHALATION_SPRAY | RESPIRATORY_TRACT | Status: DC | PRN

## 2021-04-30 MED ORDER — ACETAMINOPHEN 10 MG/ML IV SOLN: 1000.0000 mg | Freq: Once | INTRAVENOUS | Status: DC | PRN

## 2021-04-30 MED ORDER — ACETAMINOPHEN 10 MG/ML IV SOLN
INTRAVENOUS | Status: AC
  Filled 2021-04-30: qty 100

## 2021-04-30 MED ORDER — MOMETASONE FURO-FORMOTEROL FUM 200-5 MCG/ACT IN AERO
2.0000 | INHALATION_SPRAY | Freq: Two times a day (BID) | RESPIRATORY_TRACT | Status: DC
  Administered 2021-04-30 – 2021-05-05 (×10): 2 via RESPIRATORY_TRACT
  Filled 2021-04-30: qty 8.8

## 2021-04-30 MED ORDER — DEXAMETHASONE SODIUM PHOSPHATE 10 MG/ML IJ SOLN
INTRAMUSCULAR | Status: DC | PRN
  Administered 2021-04-30: 10 mg via INTRAVENOUS

## 2021-04-30 MED ORDER — SODIUM CHLORIDE 0.9 % IV SOLN
2.0000 g | Freq: Once | INTRAVENOUS | Status: AC
  Administered 2021-04-30: 2 g via INTRAVENOUS
  Filled 2021-04-30: qty 20

## 2021-04-30 MED ORDER — ACETAMINOPHEN 500 MG PO TABS: 1000.0000 mg | ORAL_TABLET | Freq: Once | ORAL | Status: AC

## 2021-04-30 MED ORDER — BUPIVACAINE-EPINEPHRINE 0.25% -1:200000 IJ SOLN
INTRAMUSCULAR | Status: DC | PRN
  Administered 2021-04-30: 30 mL

## 2021-04-30 MED ORDER — PHENYLEPHRINE 40 MCG/ML (10ML) SYRINGE FOR IV PUSH (FOR BLOOD PRESSURE SUPPORT)
PREFILLED_SYRINGE | INTRAVENOUS | Status: DC | PRN
  Administered 2021-04-30 (×2): 40 ug via INTRAVENOUS

## 2021-04-30 MED ORDER — MUPIROCIN 2 % EX OINT
1.0000 "application " | TOPICAL_OINTMENT | Freq: Two times a day (BID) | CUTANEOUS | Status: AC
  Administered 2021-04-30 – 2021-05-04 (×9): 1 via NASAL
  Filled 2021-04-30: qty 22

## 2021-04-30 MED ORDER — MORPHINE SULFATE (PF) 2 MG/ML IV SOLN
1.0000 mg | INTRAVENOUS | Status: DC | PRN
  Administered 2021-04-30: 1 mg via INTRAVENOUS
  Filled 2021-04-30: qty 1

## 2021-04-30 MED ORDER — POTASSIUM CHLORIDE CRYS ER 20 MEQ PO TBCR
40.0000 meq | EXTENDED_RELEASE_TABLET | Freq: Once | ORAL | Status: AC
  Administered 2021-04-30: 40 meq via ORAL
  Filled 2021-04-30: qty 2

## 2021-04-30 MED ORDER — ACETAMINOPHEN 325 MG PO TABS
650.0000 mg | ORAL_TABLET | Freq: Four times a day (QID) | ORAL | Status: DC | PRN
  Administered 2021-04-30 – 2021-05-05 (×6): 650 mg via ORAL
  Filled 2021-04-30 (×6): qty 2

## 2021-04-30 MED ORDER — MIDAZOLAM HCL 2 MG/2ML IJ SOLN
INTRAMUSCULAR | Status: DC | PRN
  Administered 2021-04-30: 2 mg via INTRAVENOUS

## 2021-04-30 MED ORDER — VANCOMYCIN HCL IN DEXTROSE 1-5 GM/200ML-% IV SOLN
1000.0000 mg | Freq: Once | INTRAVENOUS | Status: AC
  Administered 2021-04-30: 1000 mg via INTRAVENOUS
  Filled 2021-04-30: qty 200

## 2021-04-30 MED ORDER — LACTATED RINGERS IV SOLN: INTRAVENOUS | Status: DC | PRN

## 2021-04-30 MED ORDER — LACTATED RINGERS IV SOLN: INTRAVENOUS | Status: DC

## 2021-04-30 MED ORDER — ALBUTEROL SULFATE (2.5 MG/3ML) 0.083% IN NEBU
2.5000 mg | INHALATION_SOLUTION | RESPIRATORY_TRACT | Status: DC | PRN
  Administered 2021-05-05: 2.5 mg via RESPIRATORY_TRACT
  Filled 2021-04-30: qty 3

## 2021-04-30 MED ORDER — AMISULPRIDE (ANTIEMETIC) 5 MG/2ML IV SOLN: 10.0000 mg | Freq: Once | INTRAVENOUS | Status: DC | PRN

## 2021-04-30 MED ORDER — FENTANYL CITRATE (PF) 250 MCG/5ML IJ SOLN
INTRAMUSCULAR | Status: AC
  Filled 2021-04-30: qty 5

## 2021-04-30 MED ORDER — HYDROMORPHONE HCL 1 MG/ML IJ SOLN
INTRAMUSCULAR | Status: AC
  Filled 2021-04-30: qty 1

## 2021-04-30 MED ORDER — SODIUM CHLORIDE 0.9 % IR SOLN
Status: DC | PRN
  Administered 2021-04-30: 9000 mL

## 2021-04-30 MED ORDER — 0.9 % SODIUM CHLORIDE (POUR BTL) OPTIME
TOPICAL | Status: DC | PRN
  Administered 2021-04-30: 1000 mL

## 2021-04-30 MED ORDER — SUGAMMADEX SODIUM 200 MG/2ML IV SOLN
INTRAVENOUS | Status: DC | PRN
  Administered 2021-04-30: 150 mg via INTRAVENOUS

## 2021-04-30 MED ORDER — CEFAZOLIN SODIUM-DEXTROSE 2-4 GM/100ML-% IV SOLN
INTRAVENOUS | Status: AC
  Filled 2021-04-30: qty 100

## 2021-04-30 MED ORDER — ROCURONIUM BROMIDE 10 MG/ML (PF) SYRINGE
PREFILLED_SYRINGE | INTRAVENOUS | Status: DC | PRN
  Administered 2021-04-30: 50 mg via INTRAVENOUS

## 2021-04-30 MED ORDER — MEPERIDINE HCL 25 MG/ML IJ SOLN: 6.2500 mg | INTRAMUSCULAR | Status: DC | PRN

## 2021-04-30 MED ORDER — ACETAMINOPHEN 500 MG PO TABS
ORAL_TABLET | ORAL | Status: AC
  Administered 2021-04-30: 1000 mg via ORAL
  Filled 2021-04-30: qty 2

## 2021-04-30 MED ORDER — MORPHINE SULFATE (PF) 4 MG/ML IV SOLN
INTRAVENOUS | Status: AC
  Filled 2021-04-30: qty 2

## 2021-04-30 MED ORDER — DEXAMETHASONE SODIUM PHOSPHATE 10 MG/ML IJ SOLN
INTRAMUSCULAR | Status: AC
  Filled 2021-04-30: qty 1

## 2021-04-30 MED ORDER — CHLORHEXIDINE GLUCONATE 4 % EX LIQD: 60.0000 mL | Freq: Once | CUTANEOUS | Status: DC

## 2021-04-30 MED ORDER — ACETAMINOPHEN 325 MG PO TABS
325.0000 mg | ORAL_TABLET | Freq: Once | ORAL | Status: DC | PRN
Start: 2021-04-30 — End: 2021-05-01

## 2021-04-30 MED ORDER — VANCOMYCIN HCL IN DEXTROSE 1-5 GM/200ML-% IV SOLN
1000.0000 mg | Freq: Two times a day (BID) | INTRAVENOUS | Status: DC
  Administered 2021-04-30 – 2021-05-05 (×11): 1000 mg via INTRAVENOUS
  Filled 2021-04-30 (×14): qty 200

## 2021-04-30 MED ORDER — CEFAZOLIN SODIUM-DEXTROSE 2-4 GM/100ML-% IV SOLN
2.0000 g | INTRAVENOUS | Status: AC
  Administered 2021-04-30: 2 g via INTRAVENOUS

## 2021-04-30 MED ORDER — BUPIVACAINE-EPINEPHRINE (PF) 0.25% -1:200000 IJ SOLN
INTRAMUSCULAR | Status: AC
  Filled 2021-04-30: qty 60

## 2021-04-30 MED ORDER — ONDANSETRON HCL 4 MG/2ML IJ SOLN
INTRAMUSCULAR | Status: DC | PRN
  Administered 2021-04-30: 4 mg via INTRAVENOUS

## 2021-04-30 MED ORDER — MIDAZOLAM HCL 2 MG/2ML IJ SOLN
INTRAMUSCULAR | Status: AC
  Filled 2021-04-30: qty 2

## 2021-04-30 MED ORDER — PROPOFOL 10 MG/ML IV BOLUS
INTRAVENOUS | Status: AC
  Filled 2021-04-30: qty 20

## 2021-04-30 MED ORDER — LIDOCAINE-EPINEPHRINE 2 %-1:100000 IJ SOLN
20.0000 mL | Freq: Once | INTRAMUSCULAR | Status: AC
  Administered 2021-04-30: 20 mL
  Filled 2021-04-30: qty 1

## 2021-04-30 MED ORDER — PROPOFOL 10 MG/ML IV BOLUS
INTRAVENOUS | Status: DC | PRN
  Administered 2021-04-30: 120 mg via INTRAVENOUS

## 2021-04-30 MED ORDER — LIDOCAINE 2% (20 MG/ML) 5 ML SYRINGE
INTRAMUSCULAR | Status: DC | PRN
  Administered 2021-04-30: 20 mg via INTRAVENOUS

## 2021-04-30 MED ORDER — CHLORHEXIDINE GLUCONATE CLOTH 2 % EX PADS
6.0000 | MEDICATED_PAD | Freq: Every day | CUTANEOUS | Status: AC
  Administered 2021-05-01 – 2021-05-04 (×4): 6 via TOPICAL

## 2021-04-30 MED ORDER — FENTANYL CITRATE (PF) 250 MCG/5ML IJ SOLN
INTRAMUSCULAR | Status: DC | PRN
  Administered 2021-04-30 (×2): 100 ug via INTRAVENOUS

## 2021-04-30 MED ORDER — SODIUM CHLORIDE 0.9 % IV SOLN
2.0000 g | INTRAVENOUS | Status: DC
  Administered 2021-05-01 – 2021-05-05 (×5): 2 g via INTRAVENOUS
  Filled 2021-04-30: qty 20
  Filled 2021-04-30: qty 2
  Filled 2021-04-30 (×2): qty 20
  Filled 2021-04-30: qty 2

## 2021-04-30 MED ORDER — ACETAMINOPHEN 160 MG/5ML PO SOLN: 325.0000 mg | Freq: Once | ORAL | Status: DC | PRN

## 2021-04-30 MED ORDER — HYDROMORPHONE HCL 1 MG/ML IJ SOLN: 0.2500 mg | INTRAMUSCULAR | Status: DC | PRN

## 2021-04-30 MED ORDER — CHLORHEXIDINE GLUCONATE 0.12 % MT SOLN
OROMUCOSAL | Status: AC
  Administered 2021-04-30: 15 mL
  Filled 2021-04-30: qty 15

## 2021-04-30 MED ORDER — LIDOCAINE 2% (20 MG/ML) 5 ML SYRINGE
INTRAMUSCULAR | Status: AC
  Filled 2021-04-30: qty 5

## 2021-04-30 MED ORDER — POVIDONE-IODINE 10 % EX SWAB: 2.0000 "application " | Freq: Once | CUTANEOUS | Status: DC

## 2021-04-30 MED ORDER — ACETAMINOPHEN 650 MG RE SUPP: 650.0000 mg | Freq: Four times a day (QID) | RECTAL | Status: DC | PRN

## 2021-04-30 MED ORDER — ONDANSETRON HCL 4 MG/2ML IJ SOLN
INTRAMUSCULAR | Status: AC
  Filled 2021-04-30: qty 2

## 2021-04-30 MED ORDER — PROMETHAZINE HCL 25 MG/ML IJ SOLN: 6.2500 mg | INTRAMUSCULAR | Status: DC | PRN

## 2021-04-30 SURGICAL SUPPLY — 54 items
BAG COUNTER SPONGE SURGICOUNT (BAG) ×2 IMPLANT
BANDAGE ESMARK 6X9 LF (GAUZE/BANDAGES/DRESSINGS) IMPLANT
BLADE CUDA 5.5 (BLADE) IMPLANT
BLADE EXCALIBUR 4.0X13 (MISCELLANEOUS) ×2 IMPLANT
BLADE SURG 11 STRL SS (BLADE) ×2 IMPLANT
BNDG COHESIVE 6X5 TAN STRL LF (GAUZE/BANDAGES/DRESSINGS) ×2 IMPLANT
BNDG ELASTIC 4X5.8 VLCR STR LF (GAUZE/BANDAGES/DRESSINGS) ×4 IMPLANT
BNDG ELASTIC 6X10 VLCR STRL LF (GAUZE/BANDAGES/DRESSINGS) ×4 IMPLANT
BNDG ELASTIC 6X5.8 VLCR STR LF (GAUZE/BANDAGES/DRESSINGS) IMPLANT
BNDG ESMARK 6X9 LF (GAUZE/BANDAGES/DRESSINGS)
BRUSH SCRUB EZ PLAIN DRY (MISCELLANEOUS) ×4 IMPLANT
COVER SURGICAL LIGHT HANDLE (MISCELLANEOUS) ×4 IMPLANT
CUFF TOURN SGL QUICK 34 (TOURNIQUET CUFF)
CUFF TOURN SGL QUICK 42 (TOURNIQUET CUFF) IMPLANT
CUFF TRNQT CYL 34X4.125X (TOURNIQUET CUFF) IMPLANT
DRAPE ARTHROSCOPY W/POUCH 114 (DRAPES) IMPLANT
DRAPE IMP U-DRAPE 54X76 (DRAPES) ×4 IMPLANT
DRAPE U-SHAPE 47X51 STRL (DRAPES) ×4 IMPLANT
DRSG EMULSION OIL 3X3 NADH (GAUZE/BANDAGES/DRESSINGS) ×2 IMPLANT
DRSG PAD ABDOMINAL 8X10 ST (GAUZE/BANDAGES/DRESSINGS) IMPLANT
DW OUTFLOW CASSETTE/TUBE SET (MISCELLANEOUS) ×2 IMPLANT
GAUZE 4X4 16PLY ~~LOC~~+RFID DBL (SPONGE) ×2 IMPLANT
GAUZE SPONGE 4X4 12PLY STRL (GAUZE/BANDAGES/DRESSINGS) ×2 IMPLANT
GAUZE SPONGE 4X4 12PLY STRL LF (GAUZE/BANDAGES/DRESSINGS) ×4 IMPLANT
GLOVE SRG 8 PF TXTR STRL LF DI (GLOVE) ×1 IMPLANT
GLOVE SURG ENC MOIS LTX SZ7.5 (GLOVE) ×2 IMPLANT
GLOVE SURG ENC MOIS LTX SZ8 (GLOVE) ×2 IMPLANT
GLOVE SURG UNDER POLY LF SZ7.5 (GLOVE) ×2 IMPLANT
GLOVE SURG UNDER POLY LF SZ8 (GLOVE) ×1
GOWN STRL REUS W/ TWL LRG LVL3 (GOWN DISPOSABLE) ×2 IMPLANT
GOWN STRL REUS W/ TWL XL LVL3 (GOWN DISPOSABLE) ×1 IMPLANT
GOWN STRL REUS W/TWL LRG LVL3 (GOWN DISPOSABLE) ×2
GOWN STRL REUS W/TWL XL LVL3 (GOWN DISPOSABLE) ×1
KIT BASIN OR (CUSTOM PROCEDURE TRAY) ×2 IMPLANT
KIT TURNOVER KIT B (KITS) ×2 IMPLANT
MANIFOLD NEPTUNE II (INSTRUMENTS) ×2 IMPLANT
NEEDLE 18GX1X1/2 (RX/OR ONLY) (NEEDLE) ×4 IMPLANT
PACK ARTHROSCOPY DSU (CUSTOM PROCEDURE TRAY) ×2 IMPLANT
PAD ARMBOARD 7.5X6 YLW CONV (MISCELLANEOUS) ×4 IMPLANT
PAD CAST 4YDX4 CTTN HI CHSV (CAST SUPPLIES) ×2 IMPLANT
PADDING CAST ABS 6INX4YD NS (CAST SUPPLIES) ×1
PADDING CAST ABS COTTON 6X4 NS (CAST SUPPLIES) ×1 IMPLANT
PADDING CAST COTTON 4X4 STRL (CAST SUPPLIES) ×2
PADDING CAST COTTON 6X4 STRL (CAST SUPPLIES) ×2 IMPLANT
PORT APPOLLO RF 90DEGREE MULTI (SURGICAL WAND) IMPLANT
SPONGE T-LAP 18X18 ~~LOC~~+RFID (SPONGE) ×2 IMPLANT
STOCKINETTE IMPERVIOUS LG (DRAPES) ×2 IMPLANT
SUT ETHILON 3 0 PS 1 (SUTURE) ×2 IMPLANT
SUT ETHILON 4 0 PS 2 18 (SUTURE) ×2 IMPLANT
SYR 10ML LL (SYRINGE) ×4 IMPLANT
TOWEL GREEN STERILE FF (TOWEL DISPOSABLE) ×4 IMPLANT
TUBE CONNECTING 12X1/4 (SUCTIONS) ×2 IMPLANT
TUBING ARTHROSCOPY IRRIG 16FT (MISCELLANEOUS) ×2 IMPLANT
WATER STERILE IRR 1000ML POUR (IV SOLUTION) ×2 IMPLANT

## 2021-04-30 NOTE — Anesthesia Preprocedure Evaluation (Addendum)
Anesthesia Evaluation  Patient identified by MRN, date of birth, ID band Patient awake    Reviewed: Allergy & Precautions, NPO status , Patient's Chart, lab work & pertinent test results  Airway Mallampati: II  TM Distance: >3 FB Neck ROM: Full    Dental  (+) Teeth Intact, Dental Advisory Given   Pulmonary asthma ,    breath sounds clear to auscultation       Cardiovascular negative cardio ROS   Rhythm:Regular Rate:Normal     Neuro/Psych negative neurological ROS  negative psych ROS   GI/Hepatic negative GI ROS, Neg liver ROS,   Endo/Other  negative endocrine ROS  Renal/GU negative Renal ROS  negative genitourinary   Musculoskeletal negative musculoskeletal ROS (+)   Abdominal Normal abdominal exam  (+)   Peds  Hematology  (+) Blood dyscrasia (Hgb 8.6), anemia ,   Anesthesia Other Findings 35 y.o. female with history of asthma and chronic anemia presents to the ER with complaints of bilateral knee pain found to have b/l septic arthritis  Reproductive/Obstetrics                            Anesthesia Physical Anesthesia Plan  ASA: 2  Anesthesia Plan: General   Post-op Pain Management:    Induction: Intravenous  PONV Risk Score and Plan: 3 and Ondansetron, Dexamethasone and Midazolam  Airway Management Planned: LMA  Additional Equipment:   Intra-op Plan:   Post-operative Plan: Extubation in OR  Informed Consent: I have reviewed the patients History and Physical, chart, labs and discussed the procedure including the risks, benefits and alternatives for the proposed anesthesia with the patient or authorized representative who has indicated his/her understanding and acceptance.     Dental advisory given  Plan Discussed with: CRNA  Anesthesia Plan Comments:         Anesthesia Quick Evaluation

## 2021-04-30 NOTE — H&P (Signed)
History and Physical    Lori Black KGY:[id number] DOB: 04-May-1986 DOA: 04/29/2021  PCP: Nyoka Cowden, MD  Patient coming from: Home.  Chief Complaint: Bilateral knee pain and swelling.  HPI: Lori Black is a 35 y.o. female with history of asthma and chronic anemia presents to the ER with complaints of bilateral knee pain.  Patient states her right knee started hurting and swelling 2 days ago with subjective feeling of fever chills.  Left knee started swelling over the last 24 hours and at this point patient decided to come to the ER.  Denies any trauma or any IV drug abuse.  Denies any insect bites.  Has not had any previous joint swelling skin rash or any GI symptoms.  ED Course: In the ER patient had temperature of 99.4 F and on exam patient's left knee is more swollen than the right knee.  X-rays reveal mild joint effusion.  Labs show hemoglobin of 8.6 potassium of 2.9 CRP of 0.8 and arthrocentesis was done which shows WBC count of 1300 with gram-positive cocci in gram stain.  On-call orthopedic surgeon Dr. Marcello Fennel has been consulted patient has been started on vancomycin and ceftriaxone.  Admitted for further management.  Potassium replacement was ordered COVID test was negative.  Review of Systems: As per HPI, rest all negative.   Past Medical History:  Diagnosis Date   Asthma     Past Surgical History:  Procedure Laterality Date   CESAREAN SECTION     WRIST ARTHROSCOPY WITH DEBRIDEMENT Right 07/16/2020   Procedure: WRIST ARTHROSCOPY WITH DEBRIDEMENT TRIANGULAR FIBRO CARTILAGE REPAIR SOFT TISSUE WITH EXTENSOR CARPAL RADIALIS STABILIZATION ;  Surgeon: Betha Loa, MD;  Location: [facility];  Service: Orthopedics;  Laterality: Right;   WRIST ARTHROSCOPY WITH FOVEAL TRIANGULAR FIBROCARTILAGE COMPLEX REPAIR Right 07/16/2020   Procedure: WRIST ARTHROSCOPY WITH FOVEAL TRIANGULAR FIBROCARTILAGE COMPLEX REPAIR;  Surgeon: Betha Loa, MD;  Location:  SURGERY  CENTER;  Service: Orthopedics;  Laterality: Right;     reports that she has never smoked. She has never used smokeless tobacco. She reports current alcohol use. She reports that she does not use drugs.  No Known Allergies  Family History  Problem Relation Age of Onset   Diabetes Mother     Prior to Admission medications   Medication Sig Start Date End Date Taking? Authorizing Provider  albuterol (PROVENTIL) (2.5 MG/3ML) 0.083% nebulizer solution Take 3 mLs (2.5 mg total) by nebulization every 4 (four) hours as needed for wheezing or shortness of breath. 12/18/20  Yes Parrett, Tammy S, NP  albuterol (VENTOLIN HFA) 108 (90 Base) MCG/ACT inhaler Inhale 2 puffs into the lungs every 4 (four) hours as needed for wheezing or shortness of breath. 12/18/20  Yes Parrett, Tammy S, NP  budesonide-formoterol (SYMBICORT) 160-4.5 MCG/ACT inhaler Inhale 2 puffs into the lungs 2 (two) times daily. 12/18/20  Yes Parrett, Virgel Bouquet, NP    Physical Exam: Constitutional: Moderately built and nourished. Vitals:   04/30/21 0730 04/30/21 0736 04/30/21 0800 04/30/21 0846  BP: 116/69  118/77 118/78  Pulse: 81 87 74 91  Resp:  18 18 18   Temp:  98.5 F (36.9 C)  98 F (36.7 C)  TempSrc:  Oral  Oral  SpO2: 99% 99% 99% 99%  Weight:      Height:       Eyes: Anicteric no pallor. ENMT: No discharge from the ears eyes nose or mouth. Neck: No mass felt.  No neck rigidity.  No JVD appreciated.  Respiratory: No rhonchi or crepitations. Cardiovascular: S1-S2 heard. Abdomen: Soft nontender bowel sounds present. Musculoskeletal: Bilateral knee swelling more on the left side. Skin: No obvious rash. Neurologic: Alert awake oriented to time place and person.  Moves all extremities. Psychiatric: Appears normal.  Normal affect.   Labs on Admission: I have personally reviewed following labs and imaging studies  CBC: Recent Labs  Lab 04/29/21 2247  WBC 9.1  NEUTROABS 6.6  HGB 8.6*  HCT 29.1*  MCV 70.3*  PLT 259    Basic Metabolic Panel: Recent Labs  Lab 04/29/21 2247  NA 136  K 2.9*  CL 97*  CO2 27  GLUCOSE 187*  BUN 11  CREATININE 0.66  CALCIUM 8.9   GFR: Estimated Creatinine Clearance: 87.5 mL/min (by C-G formula based on SCr of 0.66 mg/dL). Liver Function Tests: Recent Labs  Lab 04/29/21 2247  AST 15  ALT 13  ALKPHOS 63  BILITOT 1.2  PROT 7.3  ALBUMIN 4.1   No results for input(s): LIPASE, AMYLASE in the last 168 hours. No results for input(s): AMMONIA in the last 168 hours. Coagulation Profile: No results for input(s): INR, PROTIME in the last 168 hours. Cardiac Enzymes: Recent Labs  Lab 04/30/21 0057  CKTOTAL 43   BNP (last 3 results) No results for input(s): PROBNP in the last 8760 hours. HbA1C: No results for input(s): HGBA1C in the last 72 hours. CBG: No results for input(s): GLUCAP in the last 168 hours. Lipid Profile: No results for input(s): CHOL, HDL, LDLCALC, TRIG, CHOLHDL, LDLDIRECT in the last 72 hours. Thyroid Function Tests: No results for input(s): TSH, T4TOTAL, FREET4, T3FREE, THYROIDAB in the last 72 hours. Anemia Panel: No results for input(s): VITAMINB12, FOLATE, FERRITIN, TIBC, IRON, RETICCTPCT in the last 72 hours. Urine analysis:    Component Value Date/Time   COLORURINE YELLOW 04/30/2021 0230   APPEARANCEUR HAZY (A) 04/30/2021 0230   LABSPEC 1.013 04/30/2021 0230   PHURINE 5.0 04/30/2021 0230   GLUCOSEU NEGATIVE 04/30/2021 0230   HGBUR MODERATE (A) 04/30/2021 0230   BILIRUBINUR NEGATIVE 04/30/2021 0230   KETONESUR 5 (A) 04/30/2021 0230   PROTEINUR NEGATIVE 04/30/2021 0230   NITRITE NEGATIVE 04/30/2021 0230   LEUKOCYTESUR NEGATIVE 04/30/2021 0230   Sepsis Labs: @LABRCNTIP (procalcitonin:4,lacticidven:4) ) Recent Results (from the past 240 hour(s))  Resp Panel by RT-PCR (Flu A&B, Covid) Nasopharyngeal Swab     Status: None   Collection Time: 04/30/21 12:19 AM   Specimen: Nasopharyngeal Swab; Nasopharyngeal(NP) swabs in vial  transport medium  Result Value Ref Range Status   SARS Coronavirus 2 by RT PCR NEGATIVE NEGATIVE Final    Comment: (NOTE) SARS-CoV-2 target nucleic acids are NOT DETECTED.  The SARS-CoV-2 RNA is generally detectable in upper respiratory specimens during the acute phase of infection. The lowest concentration of SARS-CoV-2 viral copies this assay can detect is 138 copies/mL. A negative result does not preclude SARS-Cov-2 infection and should not be used as the sole basis for treatment or other patient management decisions. A negative result may occur with  improper specimen collection/handling, submission of specimen other than nasopharyngeal swab, presence of viral mutation(s) within the areas targeted by this assay, and inadequate number of viral copies(<138 copies/mL). A negative result must be combined with clinical observations, patient history, and epidemiological information. The expected result is Negative.  Fact Sheet for Patients:  05/02/21  Fact Sheet for Healthcare Providers:  BloggerCourse.com  This test is no t yet approved or cleared by the [employer] and  has been authorized  for detection and/or diagnosis of SARS-CoV-2 by FDA under an Emergency Use Authorization (EUA). This EUA will remain  in effect (meaning this test can be used) for the duration of the COVID-19 declaration under Section 564(b)(1) of the Act, 21 U.S.C.section 360bbb-3(b)(1), unless the authorization is terminated  or revoked sooner.       Influenza A by PCR NEGATIVE NEGATIVE Final   Influenza B by PCR NEGATIVE NEGATIVE Final    Comment: (NOTE) The Xpert Xpress SARS-CoV-2/FLU/RSV plus assay is intended as an aid in the diagnosis of influenza from Nasopharyngeal swab specimens and should not be used as a sole basis for treatment. Nasal washings and aspirates are unacceptable for Xpert Xpress SARS-CoV-2/FLU/RSV testing.  Fact  Sheet for Patients: BloggerCourse.com  Fact Sheet for Healthcare Providers: SeriousBroker.it  This test is not yet approved or cleared by the [place] FDA and has been authorized for detection and/or diagnosis of SARS-CoV-2 by FDA under an Emergency Use Authorization (EUA). This EUA will remain in effect (meaning this test can be used) for the duration of the COVID-19 declaration under Section 564(b)(1) of the Act, 21 U.S.C. section 360bbb-3(b)(1), unless the authorization is terminated or revoked.  Performed at [facility], [address], [place], [place] [address]   Body fluid culture w Gram Stain     Status: None (Preliminary result)   Collection Time: 04/30/21  1:10 AM   Specimen: Synovium; Body Fluid  Result Value Ref Range Status   Specimen Description SYNOVIAL  Final   Special Requests NONE  Final   Gram Stain   Final    ABUNDANT WBC PRESENT,BOTH PMN AND MONONUCLEAR RARE GRAM POSITIVE COCCI Gram Stain Report Called to,Read Back By and Verified With: Mickie Bail, RN @ [phone] ON 04/30/21 Riesa Pope Performed at [facility], [address], [place], [place] [address]    Culture PENDING  Incomplete   Report Status PENDING  Incomplete     Radiological Exams on Admission: DG Knee Complete 4 Views Left  Result Date: 04/29/2021 CLINICAL DATA:  Knee pain and swelling, no known injury, initial encounter EXAM: LEFT KNEE - COMPLETE 4+ VIEW COMPARISON:  None. FINDINGS: No acute fracture or dislocation is noted. Minimal joint fluid is seen. Meniscal calcifications are identified. No other focal abnormality is seen. IMPRESSION: Minimal joint fluid. No acute bony abnormality is noted. Electronically Signed   By: Alcide Clever M.D.   On: 04/29/2021 23:48   DG Knee Complete 4 Views Right  Result Date: 04/29/2021 CLINICAL DATA:  Initial evaluation for acute pain and swelling. EXAM: RIGHT KNEE -  COMPLETE 4+ VIEW COMPARISON:  None. FINDINGS: No acute fracture dislocation. Small joint effusion present within the suprapatellar recess. Minimal juxta-articular spurring noted about the knee. Osseous mineralization normal. No discrete osseous lesions. No other visible soft tissue abnormality. IMPRESSION: 1. No acute osseous abnormality about the right knee. 2. Small joint effusion. 3. Minimal degenerative osteoarthrosis.  In Electronically Signed   By: Rise Mu M.D.   On: 04/29/2021 23:46      Assessment/Plan Principal Problem:   Septic arthritis of knee, bilateral (HCC) Active Problems:   Chronic asthma, mild persistent, uncomplicated   Septic arthritis (HCC)    Septic arthritis involving bilateral knee with knee aspirate growing gram-positive cocci final cultures are pending.  Discussed with pharmacologist recently will be continuing with vancomycin and ceftriaxone until final cultures are available.  Dr. Marcello Fennel orthopedic surgeon has been consulted and will keep patient n.p.o. in anticipation  of possible procedure. Hypokalemia cause not clear replace and recheck.  Check magnesium with next blood draw. Chronic anemia follow CBC.  Check anemia panel with next blood draw. History of asthma presently not wheezing.  We will continue home inhalers.  Since patient has septic arthritis will need close monitoring and likely will need procedure will need inpatient status.   DVT prophylaxis: SCDs.  Avoiding anticoagulation in anticipation of procedure. Code Status: Full code. Family Communication: Discussed with patient. Disposition Plan: Home. Consults called: Orthopedics. Admission status: Inpatient.   Eduard ClosArshad N Aleen Marston MD [facility] Pager [phone].  If 7PM-7AM, please contact night-coverage [email or web address] Password [email or web address]  04/30/2021, 8:54 AM

## 2021-04-30 NOTE — Transfer of Care (Signed)
Immediate Anesthesia Transfer of Care Note  Patient: Lori Black  Procedure(s) Performed: ARTHROSCOPY KNEE BILATERAL (Bilateral: Knee)  Patient Location: PACU  Anesthesia Type:General  Level of Consciousness: awake and sedated  Airway & Oxygen Therapy: Patient Spontanous Breathing  Post-op Assessment: Report given to RN and Post -op Vital signs reviewed and stable  Post vital signs: Reviewed and stable  Last Vitals:  Vitals Value Taken Time  BP 127/67 04/30/21 2207  Temp    Pulse 120 04/30/21 2208  Resp 14 04/30/21 2208  SpO2 97 % 04/30/21 2208  Vitals shown include unvalidated device data.  Last Pain:  Vitals:   04/30/21 1639  TempSrc:   PainSc: 10-Worst pain ever         Complications: No notable events documented.

## 2021-04-30 NOTE — Progress Notes (Addendum)
Tammy with [facility] notified.  Pick up scheduled for 2300 at [facility] PACU.

## 2021-04-30 NOTE — ED Notes (Signed)
Spoke with pharmacy and MD G.Shalhoub regarding vancomycin infusion. Per MD, cont infusion at this time at slower rate. Pharmacy made aware of MD decision, rates to be adjusted.

## 2021-04-30 NOTE — ED Notes (Signed)
Pt spo2 down to 84% RA, RN and tech in to check on pt. Pt sleeping, woke easily to verbal stimuli,  pt denies any feeling of SOB or throat itching or closing, but endorses minimal itching to bilateral hands/arms and chest. Denies any other sx. Vancomycin finished upon entering of room. Pt placed on 2L [place] with good effect, but cont to deny any SOB. Does endorse nightly use of inhalers and neb tx at home. MD made aware.

## 2021-04-30 NOTE — Progress Notes (Signed)
Report received from Tu, RN @ [facility]. K 2.9 yesterday 04/29/21, given for replacement as seen in [facility]. Per nurse, no labs done today.

## 2021-04-30 NOTE — Progress Notes (Signed)
Pharmacy Antibiotic Note  Lori Black is a 35 y.o. female admitted on 04/29/2021 with Bilateral atraumatic knee pain with fever at home.  No history of rheumatological issues Pharmacy has been consulted to dose vancomycin for septic joint  Plan: Vancomycin 1gm IV x 1 then 1gm q12h (AUC 530.5, Scr 0.8) Follow renal function and clinical course, levels as needed  Height: 5\' 5"  (165.1 cm) Weight: 68 kg (150 lb) IBW/kg (Calculated) : 57  Temp (24hrs), Avg:99.4 F (37.4 C), Min:99.4 F (37.4 C), Max:99.4 F (37.4 C)  Recent Labs  Lab 04/29/21 2247  WBC 9.1  CREATININE 0.66    Estimated Creatinine Clearance: 87.5 mL/min (by C-G formula based on SCr of 0.66 mg/dL).    No Known Allergies  Antimicrobials this admission: Vanc 7/15 >> CTX 7/15 x 1  Dose adjustments this admission:   Microbiology results: 7/15 BCx:  7/15 UCx:    Thank you for allowing pharmacy to be a part of this patient's care.  8/15 RPh 04/30/2021, 3:46 AM

## 2021-04-30 NOTE — Anesthesia Postprocedure Evaluation (Signed)
Anesthesia Post Note  Patient: Lori Black  Procedure(s) Performed: ARTHROSCOPY KNEE BILATERAL (Bilateral: Knee)     Patient location during evaluation: PACU Anesthesia Type: General Level of consciousness: patient cooperative, oriented and sedated Pain management: pain level controlled Vital Signs Assessment: post-procedure vital signs reviewed and stable Respiratory status: spontaneous breathing, nonlabored ventilation, respiratory function stable and patient connected to nasal cannula oxygen Cardiovascular status: blood pressure returned to baseline and stable Postop Assessment: no apparent nausea or vomiting Anesthetic complications: no   No notable events documented.  Last Vitals:  Vitals:   04/30/21 2230 04/30/21 2245  BP: 123/71   Pulse: 99 84  Resp: 15 14  Temp:    SpO2: 92% 96%    Last Pain:  Vitals:   04/30/21 2245  TempSrc:   PainSc: Asleep                 Silvie Obremski,E. Heavan Francom

## 2021-04-30 NOTE — Consult Note (Signed)
Reason for Consult:Septic knee joint Referring Physician: Midge Minium Time called: 0804 Time at bedside: 1049   Lori Black is an 35 y.o. female.  HPI: Shealeigh was in her usual state of health when her right began hurting and swelling on Wednesday. By Thursday the left knee had started as well and Thursday night she had a fever of 101. The pain continued to worsen with the left knee eclipsing the right and she came to the ED for evaluation. Left arthrocentesis was consistent with septic joint and orthopedic surgery was consulted. She denies chills, sweats, N/v, prior e/o, current wounds, IVDU, or recent illness. She does not work.  Past Medical History:  Diagnosis Date   Asthma     Past Surgical History:  Procedure Laterality Date   CESAREAN SECTION     WRIST ARTHROSCOPY WITH DEBRIDEMENT Right 07/16/2020   Procedure: WRIST ARTHROSCOPY WITH DEBRIDEMENT TRIANGULAR FIBRO CARTILAGE REPAIR SOFT TISSUE WITH EXTENSOR CARPAL RADIALIS STABILIZATION ;  Surgeon: Betha Loa, MD;  Location: [facility];  Service: Orthopedics;  Laterality: Right;   WRIST ARTHROSCOPY WITH FOVEAL TRIANGULAR FIBROCARTILAGE COMPLEX REPAIR Right 07/16/2020   Procedure: WRIST ARTHROSCOPY WITH FOVEAL TRIANGULAR FIBROCARTILAGE COMPLEX REPAIR;  Surgeon: Betha Loa, MD;  Location: [facility];  Service: Orthopedics;  Laterality: Right;    Family History  Problem Relation Age of Onset   Diabetes Mother     Social History:  reports that she has never smoked. She has never used smokeless tobacco. She reports current alcohol use. She reports that she does not use drugs.  Allergies: No Known Allergies  Medications: I have reviewed the patient's current medications.  Results for orders placed or performed during the hospital encounter of 04/29/21 (from the past 48 hour(s))  Comprehensive metabolic panel     Status: Abnormal   Collection Time: 04/29/21 10:47 PM  Result Value Ref Range    Sodium 136 135 - 145 mmol/L   Potassium 2.9 (L) 3.5 - 5.1 mmol/L   Chloride 97 (L) 98 - 111 mmol/L   CO2 27 22 - 32 mmol/L   Glucose, Bld 187 (H) 70 - 99 mg/dL    Comment: Glucose reference range applies only to samples taken after fasting for at least 8 hours.   BUN 11 6 - 20 mg/dL   Creatinine, Ser 0.86 0.44 - 1.00 mg/dL   Calcium 8.9 8.9 - 57.8 mg/dL   Total Protein 7.3 6.5 - 8.1 g/dL   Albumin 4.1 3.5 - 5.0 g/dL   AST 15 15 - 41 U/L   ALT 13 0 - 44 U/L   Alkaline Phosphatase 63 38 - 126 U/L   Total Bilirubin 1.2 0.3 - 1.2 mg/dL   GFR, Estimated >46 >96 mL/min    Comment: (NOTE) Calculated using the CKD-EPI Creatinine Equation (2021)    Anion gap 12 5 - 15    Comment: Performed at [facility], [address], [place], [place] [address]  CBC with Differential     Status: Abnormal   Collection Time: 04/29/21 10:47 PM  Result Value Ref Range   WBC 9.1 4.0 - 10.5 K/uL   RBC 4.14 3.87 - 5.11 MIL/uL   Hemoglobin 8.6 (L) 12.0 - 15.0 g/dL    Comment: Reticulocyte Hemoglobin testing may be clinically indicated, consider ordering this additional test [id number]    HCT 29.1 (L) 36.0 - 46.0 %   MCV 70.3 (L) 80.0 - 100.0 fL   MCH 20.8 (L) 26.0 - 34.0 pg  MCHC 29.6 (L) 30.0 - 36.0 g/dL   RDW 94.7 (H) 65.4 - 65.0 %   Platelets 259 150 - 400 K/uL   nRBC 0.0 0.0 - 0.2 %   Neutrophils Relative % 73 %   Neutro Abs 6.6 1.7 - 7.7 K/uL   Lymphocytes Relative 18 %   Lymphs Abs 1.6 0.7 - 4.0 K/uL   Monocytes Relative 8 %   Monocytes Absolute 0.8 0.1 - 1.0 K/uL   Eosinophils Relative 1 %   Eosinophils Absolute 0.1 0.0 - 0.5 K/uL   Basophils Relative 0 %   Basophils Absolute 0.0 0.0 - 0.1 K/uL   Immature Granulocytes 0 %   Abs Immature Granulocytes 0.03 0.00 - 0.07 K/uL    Comment: Performed at [facility], [address] [place], [place] [address]  Resp Panel by RT-PCR (Flu A&B, Covid) Nasopharyngeal Swab     Status: None   Collection Time:  04/30/21 12:19 AM   Specimen: Nasopharyngeal Swab; Nasopharyngeal(NP) swabs in vial transport medium  Result Value Ref Range   SARS Coronavirus 2 by RT PCR NEGATIVE NEGATIVE    Comment: (NOTE) SARS-CoV-2 target nucleic acids are NOT DETECTED.  The SARS-CoV-2 RNA is generally detectable in upper respiratory specimens during the acute phase of infection. The lowest concentration of SARS-CoV-2 viral copies this assay can detect is 138 copies/mL. A negative result does not preclude SARS-Cov-2 infection and should not be used as the sole basis for treatment or other patient management decisions. A negative result may occur with  improper specimen collection/handling, submission of specimen other than nasopharyngeal swab, presence of viral mutation(s) within the areas targeted by this assay, and inadequate number of viral copies(<138 copies/mL). A negative result must be combined with clinical observations, patient history, and epidemiological information. The expected result is Negative.  Fact Sheet for Patients:  BloggerCourse.com  Fact Sheet for Healthcare Providers:  SeriousBroker.it  This test is no t yet approved or cleared by the [place] FDA and  has been authorized for detection and/or diagnosis of SARS-CoV-2 by FDA under an Emergency Use Authorization (EUA). This EUA will remain  in effect (meaning this test can be used) for the duration of the COVID-19 declaration under Section 564(b)(1) of the Act, 21 U.S.C.section 360bbb-3(b)(1), unless the authorization is terminated  or revoked sooner.       Influenza A by PCR NEGATIVE NEGATIVE   Influenza B by PCR NEGATIVE NEGATIVE    Comment: (NOTE) The Xpert Xpress SARS-CoV-2/FLU/RSV plus assay is intended as an aid in the diagnosis of influenza from Nasopharyngeal swab specimens and should not be used as a sole basis for treatment. Nasal washings and aspirates are  unacceptable for Xpert Xpress SARS-CoV-2/FLU/RSV testing.  Fact Sheet for Patients: BloggerCourse.com  Fact Sheet for Healthcare Providers: SeriousBroker.it  This test is not yet approved or cleared by the [place] FDA and has been authorized for detection and/or diagnosis of SARS-CoV-2 by FDA under an Emergency Use Authorization (EUA). This EUA will remain in effect (meaning this test can be used) for the duration of the COVID-19 declaration under Section 564(b)(1) of the Act, 21 U.S.C. section 360bbb-3(b)(1), unless the authorization is terminated or revoked.  Performed at [facility], [address], [place], [place] [address]   Sedimentation rate     Status: Abnormal   Collection Time: 04/30/21 12:34 AM  Result Value Ref Range   Sed Rate 29 (H) 0 - 22 mm/hr    Comment: Performed at  [facility], [address] [place], [place] [address]  C-reactive protein     Status: None   Collection Time: 04/30/21 12:34 AM  Result Value Ref Range   CRP 0.8 <1.0 mg/dL    Comment: Performed at [facility], [address] [place], [place] [address]  Uric acid     Status: None   Collection Time: 04/30/21 12:34 AM  Result Value Ref Range   Uric Acid, Serum 4.0 2.5 - 7.1 mg/dL    Comment: Performed at [facility], [address] [place], [place] [address]  CK     Status: None   Collection Time: 04/30/21 12:57 AM  Result Value Ref Range   Total CK 43 38 - 234 U/L    Comment: Performed at [facility], [address] [place], [place] [address]  Body fluid culture w Gram Stain     Status: None (Preliminary result)   Collection Time: 04/30/21  1:10 AM   Specimen: Synovium; Body Fluid  Result Value Ref Range   Specimen Description SYNOVIAL    Special Requests NONE    Gram Stain      ABUNDANT WBC PRESENT,BOTH PMN AND MONONUCLEAR RARE  GRAM POSITIVE COCCI Gram Stain Report Called to,Read Back By and Verified With: Mickie BailBRIANNA SAVOIE, RN @ [phone] ON 04/30/21 Riesa Pope VARNER Performed at [facility], [address] [place], [place] [address]    Culture PENDING    Report Status PENDING   Synovial cell count + diff, w/ crystals     Status: Abnormal   Collection Time: 04/30/21  1:10 AM  Result Value Ref Range   Color, Synovial STRAW (A) YELLOW   Appearance-Synovial TURBID (A) CLEAR   Crystals, Fluid NO CRYSTALS SEEN    WBC, Synovial 1,350 (H) 0 - 200 /cu mm   Neutrophil, Synovial 82 (H) 0 - 25 %   Lymphocytes-Synovial Fld 1 0 - 20 %   Monocyte-Macrophage-Synovial Fluid 17 (L) 50 - 90 %    Comment: Performed at [facility], [address] [place], [place] [address]  Urinalysis, Routine w reflex microscopic     Status: Abnormal   Collection Time: 04/30/21  2:30 AM  Result Value Ref Range   Color, Urine YELLOW YELLOW   APPearance HAZY (A) CLEAR   Specific Gravity, Urine 1.013 1.005 - 1.030   pH 5.0 5.0 - 8.0   Glucose, UA NEGATIVE NEGATIVE mg/dL   Hgb urine dipstick MODERATE (A) NEGATIVE   Bilirubin Urine NEGATIVE NEGATIVE   Ketones, ur 5 (A) NEGATIVE mg/dL   Protein, ur NEGATIVE NEGATIVE mg/dL   Nitrite NEGATIVE NEGATIVE   Leukocytes,Ua NEGATIVE NEGATIVE   RBC / HPF 0-5 0 - 5 RBC/hpf   WBC, UA 0-5 0 - 5 WBC/hpf   Bacteria, UA RARE (A) NONE SEEN   Squamous Epithelial / LPF 0-5 0 - 5   Mucus PRESENT     Comment: Performed at [facility], [address] [place], [place] [address]  Pregnancy, urine     Status: None   Collection Time: 04/30/21  2:30 AM  Result Value Ref Range   Preg Test, Ur NEGATIVE NEGATIVE    Comment:        THE SENSITIVITY OF THIS METHODOLOGY IS >20 mIU/mL. Performed at [facility], [address] [place], [place] [address]   Rapid urine drug screen (hospital performed)     Status: None   Collection  Time: 04/30/21  2:30 AM   Result Value Ref Range   Opiates NONE DETECTED NONE DETECTED   Cocaine NONE DETECTED NONE DETECTED   Benzodiazepines NONE DETECTED NONE DETECTED   Amphetamines NONE DETECTED NONE DETECTED   Tetrahydrocannabinol NONE DETECTED NONE DETECTED   Barbiturates NONE DETECTED NONE DETECTED    Comment: (NOTE) DRUG SCREEN FOR MEDICAL PURPOSES ONLY.  IF CONFIRMATION IS NEEDED FOR ANY PURPOSE, NOTIFY LAB WITHIN 5 DAYS.  LOWEST DETECTABLE LIMITS FOR URINE DRUG SCREEN Drug Class                     Cutoff (ng/mL) Amphetamine and metabolites    1000 Barbiturate and metabolites    200 Benzodiazepine                 200 Tricyclics and metabolites     300 Opiates and metabolites        300 Cocaine and metabolites        300 THC                            50 Performed at [facility], [address] [place], [place] [address]     DG Knee Complete 4 Views Left  Result Date: 04/29/2021 CLINICAL DATA:  Knee pain and swelling, no known injury, initial encounter EXAM: LEFT KNEE - COMPLETE 4+ VIEW COMPARISON:  None. FINDINGS: No acute fracture or dislocation is noted. Minimal joint fluid is seen. Meniscal calcifications are identified. No other focal abnormality is seen. IMPRESSION: Minimal joint fluid. No acute bony abnormality is noted. Electronically Signed   By: Alcide Clever M.D.   On: 04/29/2021 23:48   DG Knee Complete 4 Views Right  Result Date: 04/29/2021 CLINICAL DATA:  Initial evaluation for acute pain and swelling. EXAM: RIGHT KNEE - COMPLETE 4+ VIEW COMPARISON:  None. FINDINGS: No acute fracture dislocation. Small joint effusion present within the suprapatellar recess. Minimal juxta-articular spurring noted about the knee. Osseous mineralization normal. No discrete osseous lesions. No other visible soft tissue abnormality. IMPRESSION: 1. No acute osseous abnormality about the right knee. 2. Small joint effusion. 3. Minimal degenerative osteoarthrosis.  In Electronically  Signed   By: Rise Mu M.D.   On: 04/29/2021 23:46    Review of Systems  Constitutional:  Positive for fever. Negative for chills and diaphoresis.  HENT:  Negative for ear discharge, ear pain, hearing loss and tinnitus.   Eyes:  Negative for photophobia and pain.  Respiratory:  Negative for cough and shortness of breath.   Cardiovascular:  Negative for chest pain.  Gastrointestinal:  Negative for abdominal pain, nausea and vomiting.  Genitourinary:  Negative for dysuria, flank pain, frequency and urgency.  Musculoskeletal:  Positive for arthralgias (Bilateral knee pain). Negative for back pain, myalgias and neck pain.  Neurological:  Negative for dizziness and headaches.  Hematological:  Does not bruise/bleed easily.  Psychiatric/Behavioral:  The patient is not nervous/anxious.   Blood pressure 118/78, pulse 91, temperature 98 F (36.7 C), temperature source Oral, resp. rate 18, height 5\' 5"  (1.651 m), weight 68 kg, last menstrual period 04/25/2021, SpO2 99 %. Physical Exam Constitutional:      General: She is not in acute distress.    Appearance: She is well-developed. She is not diaphoretic.  HENT:     Head: Normocephalic and atraumatic.  Eyes:     General: No scleral icterus.       Right  eye: No discharge.        Left eye: No discharge.     Conjunctiva/sclera: Conjunctivae normal.  Cardiovascular:     Rate and Rhythm: Normal rate and regular rhythm.  Pulmonary:     Effort: Pulmonary effort is normal. No respiratory distress.  Musculoskeletal:     Cervical back: Normal range of motion.     Comments: BLE No traumatic wounds, ecchymosis, or rash  Mod TTP bilateral knees, AROM limited to 10 degrees left, 15-20 degrees right, unable to straighten fully  Mild ankle effusions bilaterally  Knee stable to varus/ valgus and anterior/posterior stress but painful  Sens DPN, SPN, TN intact  Motor EHL, ext, flex, evers 5/5  DP 2+, PT 2+, No significant edema  Skin:     General: Skin is warm and dry.  Neurological:     Mental Status: She is alert.  Psychiatric:        Mood and Affect: Mood normal.        Behavior: Behavior normal.    Assessment/Plan: Bilateral septic knee joints -- Plan for I&D by Dr. Carola Frost today at [facility]. Likely transfer back to [facility] after surgery. Please keep NPO. Asthma    Freeman Caldron, PA-C Orthopedic Surgery [phone] 04/30/2021, 10:55 AM

## 2021-04-30 NOTE — Plan of Care (Signed)
  Problem: Nutrition: Goal: Adequate nutrition will be maintained Outcome: Progressing   Problem: Pain Managment: Goal: General experience of comfort will improve Outcome: Progressing   Problem: Safety: Goal: Ability to remain free from injury will improve Outcome: Progressing   

## 2021-04-30 NOTE — Anesthesia Procedure Notes (Signed)
Procedure Name: Intubation Date/Time: 04/30/2021 8:17 PM Performed by: Molli Hazard, CRNA Pre-anesthesia Checklist: Patient identified, Emergency Drugs available, Suction available and Patient being monitored Patient Re-evaluated:Patient Re-evaluated prior to induction Oxygen Delivery Method: Circle system utilized Preoxygenation: Pre-oxygenation with 100% oxygen Induction Type: IV induction Ventilation: Mask ventilation without difficulty Laryngoscope Size: Miller and 2 Grade View: Grade I Tube type: Oral Tube size: 7.5 mm Number of attempts: 1 Airway Equipment and Method: Stylet Placement Confirmation: ETT inserted through vocal cords under direct vision, positive ETCO2 and breath sounds checked- equal and bilateral Secured at: 23 cm Tube secured with: Tape Dental Injury: Teeth and Oropharynx as per pre-operative assessment

## 2021-04-30 NOTE — Progress Notes (Signed)
Called [facility] short stay and OR for transport time, @ 6PM.

## 2021-04-30 NOTE — ED Provider Notes (Signed)
[facility] DEPT Provider Note   CSN: [id number] Arrival date & time: 04/29/21  2210     History Chief Complaint  Patient presents with   Leg Pain   Leg Swelling    Lori Black is a 35 y.o. female.  Atraumatic bilateral knee pain for the past 1 day.  States it started hurting in her right knee yesterday on the medial side and now both knees are hurting equally today.  Reports pain with attempted flexion and extension and reports difficulty walking due to pain.  Denies any fall or injury.  Denies activity where she was kneeling for prolonged period of time.  Denies any other joint issues.  Has had R wrist surgery in past. Denies any history of any other joints or rheumatological issues.  Reports a fever to 101 at home today but did not take anything for it.  Denies any vaginal discharge or recent STDs.  Denies any chest pain or shortness of breath.  Denies any IV drug abuse.  No cough, sore throat, runny nose, pain with urination or blood in the urine.  The history is provided by the patient.      Past Medical History:  Diagnosis Date   Asthma     Patient Active Problem List   Diagnosis Date Noted   Rhinitis, chronic 07/31/2018   Chest pain, musculoskeletal 07/19/2017   Chronic asthma, mild persistent, uncomplicated 17/51/0258   Asthma with acute exacerbation 10/11/2016   Microcytic anemia 10/11/2016   Influenza A 10/11/2016   Acute respiratory failure ([place]) 10/09/2016    Past Surgical History:  Procedure Laterality Date   CESAREAN SECTION     WRIST ARTHROSCOPY WITH DEBRIDEMENT Right 07/16/2020   Procedure: WRIST ARTHROSCOPY WITH DEBRIDEMENT TRIANGULAR FIBRO CARTILAGE REPAIR SOFT TISSUE WITH EXTENSOR CARPAL RADIALIS STABILIZATION ;  Surgeon: Leanora Cover, MD;  Location: [facility];  Service: Orthopedics;  Laterality: Right;   WRIST ARTHROSCOPY WITH FOVEAL TRIANGULAR FIBROCARTILAGE COMPLEX REPAIR Right 07/16/2020   Procedure:  WRIST ARTHROSCOPY WITH FOVEAL TRIANGULAR FIBROCARTILAGE COMPLEX REPAIR;  Surgeon: Leanora Cover, MD;  Location: [facility];  Service: Orthopedics;  Laterality: Right;     OB History   No obstetric history on file.     Family History  Problem Relation Age of Onset   Diabetes Mother     Social History   Tobacco Use   Smoking status: Never   Smokeless tobacco: Never  Vaping Use   Vaping Use: Never used  Substance Use Topics   Alcohol use: Yes    Comment: occassionally   Drug use: No    Home Medications Prior to Admission medications   Medication Sig Start Date End Date Taking? Authorizing Provider  albuterol (PROVENTIL) (2.5 MG/3ML) 0.083% nebulizer solution Take 3 mLs (2.5 mg total) by nebulization every 4 (four) hours as needed for wheezing or shortness of breath. 12/18/20  Yes Parrett, Tammy S, NP  albuterol (VENTOLIN HFA) 108 (90 Base) MCG/ACT inhaler Inhale 2 puffs into the lungs every 4 (four) hours as needed for wheezing or shortness of breath. 12/18/20  Yes Parrett, Tammy S, NP  budesonide-formoterol (SYMBICORT) 160-4.5 MCG/ACT inhaler Inhale 2 puffs into the lungs 2 (two) times daily. 12/18/20  Yes Parrett, Fonnie Mu, NP    Allergies    Patient has no known allergies.  Review of Systems   Review of Systems  Constitutional:  Positive for appetite change and fever. Negative for activity change.  HENT:  Negative for congestion and rhinorrhea.  Respiratory:  Negative for cough, chest tightness and shortness of breath.   Cardiovascular:  Negative for chest pain.  Gastrointestinal:  Negative for abdominal pain, nausea and vomiting.  Genitourinary:  Negative for dysuria and hematuria.  Musculoskeletal:  Positive for arthralgias and myalgias.  Skin:  Negative for rash and wound.  Neurological:  Negative for dizziness, weakness and headaches.   all other systems are negative except as noted in the HPI and PMH.   Physical Exam Updated Vital Signs BP 122/83    Pulse 99   Temp 99.4 F (37.4 C) (Oral)   Resp 16   Ht $R'5\' 5"'CF$  (1.651 m)   Wt 68 kg   LMP 04/25/2021   SpO2 98%   BMI 24.96 kg/m   Physical Exam Vitals and nursing note reviewed.  Constitutional:      General: She is not in acute distress.    Appearance: She is well-developed.  HENT:     Head: Normocephalic and atraumatic.     Mouth/Throat:     Pharynx: No oropharyngeal exudate.  Eyes:     Conjunctiva/sclera: Conjunctivae normal.     Pupils: Pupils are equal, round, and reactive to light.  Neck:     Comments: No meningismus. Cardiovascular:     Rate and Rhythm: Normal rate and regular rhythm.     Heart sounds: Normal heart sounds. No murmur heard. Pulmonary:     Effort: Pulmonary effort is normal. No respiratory distress.     Breath sounds: Normal breath sounds.  Abdominal:     Palpations: Abdomen is soft.     Tenderness: There is no abdominal tenderness. There is no guarding or rebound.  Musculoskeletal:        General: Swelling and tenderness present.     Cervical back: Normal range of motion and neck supple.     Comments: Left knee is diffusely warm and swollen but not erythematous.  Tenderness along the medial joint line with reduced range of motion of flexion and extension.  Right knee is also warm but less so. No obvious effusion.  Tenderness along medial joint line with reduced range of motion of flexion and extension.  Calves are soft.  Intact DP and PT pulses  Skin:    General: Skin is warm.  Neurological:     Mental Status: She is alert and oriented to person, place, and time.     Cranial Nerves: No cranial nerve deficit.     Motor: No abnormal muscle tone.     Coordination: Coordination normal.     Comments:  5/5 strength throughout. CN 2-12 intact.Equal grip strength.   Psychiatric:        Behavior: Behavior normal.    ED Results / Procedures / Treatments   Labs (all labs ordered are listed, but only abnormal results are displayed) Labs Reviewed   COMPREHENSIVE METABOLIC PANEL - Abnormal; Notable for the following components:      Result Value   Potassium 2.9 (*)    Chloride 97 (*)    Glucose, Bld 187 (*)    All other components within normal limits  CBC WITH DIFFERENTIAL/PLATELET - Abnormal; Notable for the following components:   Hemoglobin 8.6 (*)    HCT 29.1 (*)    MCV 70.3 (*)    MCH 20.8 (*)    MCHC 29.6 (*)    RDW 16.3 (*)    All other components within normal limits  SEDIMENTATION RATE - Abnormal; Notable for the following components:   Sed Rate  29 (*)    All other components within normal limits  SYNOVIAL CELL COUNT + DIFF, W/ CRYSTALS - Abnormal; Notable for the following components:   Color, Synovial STRAW (*)    Appearance-Synovial TURBID (*)    WBC, Synovial 1,350 (*)    Neutrophil, Synovial 82 (*)    Monocyte-Macrophage-Synovial Fluid 17 (*)    All other components within normal limits  URINALYSIS, ROUTINE W REFLEX MICROSCOPIC - Abnormal; Notable for the following components:   APPearance HAZY (*)    Hgb urine dipstick MODERATE (*)    Ketones, ur 5 (*)    Bacteria, UA RARE (*)    All other components within normal limits  RESP PANEL BY RT-PCR (FLU A&B, COVID) ARPGX2  BODY FLUID CULTURE W GRAM STAIN  URINE CULTURE  CULTURE, BLOOD (ROUTINE X 2)  CULTURE, BLOOD (ROUTINE X 2)  C-REACTIVE PROTEIN  URIC ACID  CK  PREGNANCY, URINE  GLUCOSE, BODY FLUID OTHER            PROTEIN, BODY FLUID (OTHER)  RAPID URINE DRUG SCREEN, HOSP PERFORMED    EKG None  Radiology DG Knee Complete 4 Views Left  Result Date: 04/29/2021 CLINICAL DATA:  Knee pain and swelling, no known injury, initial encounter EXAM: LEFT KNEE - COMPLETE 4+ VIEW COMPARISON:  None. FINDINGS: No acute fracture or dislocation is noted. Minimal joint fluid is seen. Meniscal calcifications are identified. No other focal abnormality is seen. IMPRESSION: Minimal joint fluid. No acute bony abnormality is noted. Electronically Signed   By: Alcide Clever  M.D.   On: 04/29/2021 23:48   DG Knee Complete 4 Views Right  Result Date: 04/29/2021 CLINICAL DATA:  Initial evaluation for acute pain and swelling. EXAM: RIGHT KNEE - COMPLETE 4+ VIEW COMPARISON:  None. FINDINGS: No acute fracture dislocation. Small joint effusion present within the suprapatellar recess. Minimal juxta-articular spurring noted about the knee. Osseous mineralization normal. No discrete osseous lesions. No other visible soft tissue abnormality. IMPRESSION: 1. No acute osseous abnormality about the right knee. 2. Small joint effusion. 3. Minimal degenerative osteoarthrosis.  In Electronically Signed   By: Rise Mu M.D.   On: 04/29/2021 23:46    Procedures .Joint Aspiration/Arthrocentesis  Date/Time: 04/30/2021 1:55 AM Performed by: Glynn Octave, MD Authorized by: Glynn Octave, MD   Consent:    Consent obtained:  Verbal   Consent given by:  Patient   Risks, benefits, and alternatives were discussed: yes     Risks discussed:  Bleeding, incomplete drainage, nerve damage, infection, pain and poor cosmetic result   Alternatives discussed:  No treatment Universal protocol:    Procedure explained and questions answered to patient or proxy's satisfaction: yes     Relevant documents present and verified: yes     Test results available: yes     Imaging studies available: yes     Required blood products, implants, devices, and special equipment available: yes     Site/side marked: yes     Immediately prior to procedure, a time out was called: yes     Patient identity confirmed:  Arm band Location:    Location:  Knee   Knee:  L knee Anesthesia:    Anesthesia method:  Local infiltration   Local anesthetic:  Lidocaine 2% WITH epi Procedure details:    Preparation: Patient was prepped and draped in usual sterile fashion     Needle gauge:  18 G   Ultrasound guidance: no     Approach:  Medial  Aspirate amount:  8   Aspirate characteristics:  Cloudy and  yellow   Steroid injected: no     Specimen collected: no   Post-procedure details:    Dressing:  Adhesive bandage   Procedure completion:  Tolerated well, no immediate complications .Critical Care  Date/Time: 04/30/2021 4:51 AM Performed by: Ezequiel Essex, MD Authorized by: Ezequiel Essex, MD   Critical care provider statement:    Critical care time (minutes):  35   Critical care was necessary to treat or prevent imminent or life-threatening deterioration of the following conditions: septic joint.   Critical care was time spent personally by me on the following activities:  Discussions with consultants, evaluation of patient's response to treatment, examination of patient, ordering and performing treatments and interventions, ordering and review of laboratory studies, ordering and review of radiographic studies, pulse oximetry, re-evaluation of patient's condition, obtaining history from patient or surrogate and review of old charts   Medications Ordered in ED Medications  potassium chloride SA (KLOR-CON) CR tablet 40 mEq (has no administration in time range)  lidocaine-EPINEPHrine (XYLOCAINE W/EPI) 2 %-1:100000 (with pres) injection 20 mL (has no administration in time range)    ED Course  I have reviewed the triage vital signs and the nursing notes.  Pertinent labs & imaging results that were available during my care of the patient were reviewed by me and considered in my medical decision making (see chart for details).    MDM Rules/Calculators/A&P                         Bilateral atraumatic knee pain with fever at home.  No history of rheumatological issues.  Temp 99.4 on ED arrival.  Small joint effusions bilaterally with reduced range of motion overlying warmth but no erythema. Labs with no leukocytosis.  Unclear etiology of patient's pain and fever.  We will proceed with joint aspiration to rule out septic joint. The left knee is the more painful and swollen of the  two.  WBC normal. Uric acid normal. ESR 29. CRP normal.  Gram stain of synovial fluid. there are abundant white blood cells as well as rare gram-positive cocci.  Cell count is 1350 with 85% neutrophils.  No crystals.  Blood cultures obtained.  Patient started on broad-spectrum antibiotics. Concern for possible DC arthritis.  Discussed with Dr. Marcelino Scot of orthopedics.  Agrees with OR washout later today.  He will likely irrigate both knees.  Unclear etiology of her infection.  Recommends n.p.o. and hospitalist admission. D/w Dr. Cyd Silence Final Clinical Impression(s) / ED Diagnoses Final diagnoses:  Pyogenic arthritis of left knee joint, due to unspecified organism [facility])    Rx / DC Orders ED Discharge Orders     None        Mariesa Grieder, Annie Main, MD 04/30/21 [phone]

## 2021-04-30 NOTE — Progress Notes (Signed)
[facility] called and transportation setup at 1730.

## 2021-05-01 ENCOUNTER — Encounter (HOSPITAL_COMMUNITY): Payer: Self-pay | Admitting: Orthopedic Surgery

## 2021-05-01 LAB — SYNOVIAL CELL COUNT + DIFF, W/ CRYSTALS
Crystals, Fluid: NONE SEEN
Crystals, Fluid: NONE SEEN
Eosinophils-Synovial: 0 % (ref 0–1)
Eosinophils-Synovial: 0 % (ref 0–1)
Lymphocytes-Synovial Fld: 0 % (ref 0–20)
Lymphocytes-Synovial Fld: 2 % (ref 0–20)
Monocyte-Macrophage-Synovial Fluid: 19 % — ABNORMAL LOW (ref 50–90)
Monocyte-Macrophage-Synovial Fluid: 6 % — ABNORMAL LOW (ref 50–90)
Neutrophil, Synovial: 81 % — ABNORMAL HIGH (ref 0–25)
Neutrophil, Synovial: 92 % — ABNORMAL HIGH (ref 0–25)
WBC, Synovial: 23250 /mm3 — ABNORMAL HIGH (ref 0–200)
WBC, Synovial: 5050 /mm3 — ABNORMAL HIGH (ref 0–200)

## 2021-05-01 LAB — CBC WITH DIFFERENTIAL/PLATELET
Abs Immature Granulocytes: 0.07 10*3/uL (ref 0.00–0.07)
Basophils Absolute: 0 10*3/uL (ref 0.0–0.1)
Basophils Relative: 0 %
Eosinophils Absolute: 0 10*3/uL (ref 0.0–0.5)
Eosinophils Relative: 0 %
HCT: 29.8 % — ABNORMAL LOW (ref 36.0–46.0)
Hemoglobin: 8.9 g/dL — ABNORMAL LOW (ref 12.0–15.0)
Immature Granulocytes: 1 %
Lymphocytes Relative: 4 %
Lymphs Abs: 0.5 10*3/uL — ABNORMAL LOW (ref 0.7–4.0)
MCH: 21.1 pg — ABNORMAL LOW (ref 26.0–34.0)
MCHC: 29.9 g/dL — ABNORMAL LOW (ref 30.0–36.0)
MCV: 70.6 fL — ABNORMAL LOW (ref 80.0–100.0)
Monocytes Absolute: 0.8 10*3/uL (ref 0.1–1.0)
Monocytes Relative: 7 %
Neutro Abs: 10.4 10*3/uL — ABNORMAL HIGH (ref 1.7–7.7)
Neutrophils Relative %: 88 %
Platelets: 259 10*3/uL (ref 150–400)
RBC: 4.22 MIL/uL (ref 3.87–5.11)
RDW: 16.5 % — ABNORMAL HIGH (ref 11.5–15.5)
WBC: 11.7 10*3/uL — ABNORMAL HIGH (ref 4.0–10.5)
nRBC: 0 % (ref 0.0–0.2)

## 2021-05-01 LAB — URINE CULTURE

## 2021-05-01 LAB — BASIC METABOLIC PANEL
Anion gap: 11 (ref 5–15)
BUN: 11 mg/dL (ref 6–20)
CO2: 29 mmol/L (ref 22–32)
Calcium: 8.3 mg/dL — ABNORMAL LOW (ref 8.9–10.3)
Chloride: 98 mmol/L (ref 98–111)
Creatinine, Ser: 0.67 mg/dL (ref 0.44–1.00)
GFR, Estimated: >60 mL/min (ref >60)
Glucose, Bld: 168 mg/dL — ABNORMAL HIGH (ref 70–99)
Potassium: 3.3 mmol/L — ABNORMAL LOW (ref 3.5–5.1)
Sodium: 138 mmol/L (ref 135–145)

## 2021-05-01 LAB — MAGNESIUM: Magnesium: 1 mg/dL — ABNORMAL LOW (ref 1.7–2.4)

## 2021-05-01 LAB — HIV ANTIBODY (ROUTINE TESTING W REFLEX): HIV Screen 4th Generation wRfx: NONREACTIVE

## 2021-05-01 MED ORDER — MAGNESIUM SULFATE 4 GM/100ML IV SOLN
4.0000 g | Freq: Once | INTRAVENOUS | Status: AC
  Administered 2021-05-01: 4 g via INTRAVENOUS
  Filled 2021-05-01: qty 100

## 2021-05-01 MED ORDER — POTASSIUM CHLORIDE CRYS ER 20 MEQ PO TBCR
40.0000 meq | EXTENDED_RELEASE_TABLET | Freq: Once | ORAL | Status: AC
  Administered 2021-05-01: 40 meq via ORAL
  Filled 2021-05-01: qty 2

## 2021-05-01 NOTE — Progress Notes (Signed)
PROGRESS NOTE    Lori Black  CHY:[id number] DOB: Dec 06, 1985 DOA: 04/29/2021 PCP: Nyoka Cowden, MD   Brief Narrative:  Lori Black is a 35 y.o. female with history of asthma and chronic anemia presented to the ER with complaints of bilateral knee pain, symptom onset duration of only 1 to 2 days.  No other complaint.  She was admitted under hospitalist service at [facility] long with a diagnosis of bilateral septic knee arthritis but then was transported to [facility] and underwent incision and drainage of bilateral knees by Dr. Carola Frost and now back to [facility].  Assessment & Plan:   Principal Problem:   Septic arthritis of knee, bilateral (HCC) Active Problems:   Chronic asthma, mild persistent, uncomplicated   Septic arthritis (HCC)  Septic arthritis of bilateral knee: S/p I&D bilateral knee by Dr. Carola Frost on 04/30/2021.  Remains on ceftriaxone and vancomycin which I will continue.  Awaiting further recommendations from orthopedics.  Chronic microcytic/possible iron deficiency anemia: Hemoglobin at baseline remains around 9.  Currently stable.  Hypokalemia: Low again.  Will replace.  Recheck in the morning.  Hypomagnesemia: 1.0.  Will replace.  Mild intermittent asthma: Controlled.  She is not hypoxic.  Continue home medications.  DVT prophylaxis: Place and maintain sequential compression device Start: 05/01/21 0022 SCDs Start: 04/30/21 0853   Code Status: Full Code  Family Communication: None present at bedside.  Plan of care discussed with patient in length and he verbalized understanding and agreed with it.  Status is: Inpatient  Remains inpatient appropriate because:Inpatient level of care appropriate due to severity of illness  Dispo: The patient is from: Home              Anticipated d/c is to: Home              Patient currently is not medically stable to d/c.   Difficult to place patient No        Estimated body mass index is 24.96 kg/m as calculated from the  following:   Height as of this encounter: 5\' 5"  (1.651 m).   Weight as of this encounter: 68 kg.      Nutritional status:               Consultants:  Orthopedics  Procedures:  I&D bilateral knees  Antimicrobials:  Anti-infectives (From admission, onward)    Start     Dose/Rate Route Frequency Ordered Stop   05/01/21 0400  cefTRIAXone (ROCEPHIN) 2 g in sodium chloride 0.9 % 100 mL IVPB        2 g 200 mL/hr over 30 Minutes Intravenous Every 24 hours 04/30/21 0858     04/30/21 2015  ceFAZolin (ANCEF) IVPB 2g/100 mL premix        2 g 200 mL/hr over 30 Minutes Intravenous On call to O.R. 04/30/21 1853 04/30/21 2003   04/30/21 1855  ceFAZolin (ANCEF) 2-4 GM/100ML-% IVPB       Note to Pharmacy: 05/02/21   : cabinet override      04/30/21 1855 04/30/21 2040   04/30/21 1600  vancomycin (VANCOCIN) IVPB 1000 mg/200 mL premix        1,000 mg 100 mL/hr over 120 Minutes Intravenous Every 12 hours 04/30/21 0450     04/30/21 0345  vancomycin (VANCOCIN) IVPB 1000 mg/200 mL premix        1,000 mg 200 mL/hr over 60 Minutes Intravenous  Once 04/30/21 0330 04/30/21 0547   04/30/21 0330  cefTRIAXone (ROCEPHIN) 2  g in sodium chloride 0.9 % 100 mL IVPB        2 g 200 mL/hr over 30 Minutes Intravenous  Once 04/30/21 0319 04/30/21 0447          Subjective: Seen and examined.  Pain well controlled.  No other complaint.  Objective: Vitals:   05/01/21 0025 05/01/21 0220 05/01/21 0609 05/01/21 0831  BP: 125/82 119/75 133/85   Pulse: 91 86 93   Resp: 16 16 17    Temp: 98.5 F (36.9 C) 98.3 F (36.8 C) (!) 97.5 F (36.4 C)   TempSrc: Oral Oral Oral   SpO2: 93% 98% 100% 99%  Weight:      Height:        Intake/Output Summary (Last 24 hours) at 05/01/2021 0946 Last data filed at 05/01/2021 0600 Gross per 24 hour  Intake 1766.14 ml  Output 0 ml  Net 1766.14 ml   Filed Weights   04/29/21 2219  Weight: 68 kg    Examination:  General exam: Appears calm and  comfortable  Respiratory system: Clear to auscultation. Respiratory effort normal. Cardiovascular system: S1 & S2 heard, RRR. No JVD, murmurs, rubs, gallops or clicks. No pedal edema. Gastrointestinal system: Abdomen is nondistended, soft and nontender. No organomegaly or masses felt. Normal bowel sounds heard. Central nervous system: Alert and oriented. No focal neurological deficits. Extremities: Limited range of motion in bilateral knees.  Has dressing in the bilateral knees. Skin: No rashes, lesions or ulcers Psychiatry: Judgement and insight appear normal. Mood & affect appropriate.    Data Reviewed: I have personally reviewed following labs and imaging studies  CBC: Recent Labs  Lab 04/29/21 2247 05/01/21 0743  WBC 9.1 11.7*  NEUTROABS 6.6 10.4*  HGB 8.6* 8.9*  HCT 29.1* 29.8*  MCV 70.3* 70.6*  PLT 259 259   Basic Metabolic Panel: Recent Labs  Lab 04/29/21 2247 05/01/21 0743  NA 136 138  K 2.9* 3.3*  CL 97* 98  CO2 27 29  GLUCOSE 187* 168*  BUN 11 11  CREATININE 0.66 0.67  CALCIUM 8.9 8.3*  MG  --  1.0*   GFR: Estimated Creatinine Clearance: 87.5 mL/min (by C-G formula based on SCr of 0.67 mg/dL). Liver Function Tests: Recent Labs  Lab 04/29/21 2247  AST 15  ALT 13  ALKPHOS 63  BILITOT 1.2  PROT 7.3  ALBUMIN 4.1   No results for input(s): LIPASE, AMYLASE in the last 168 hours. No results for input(s): AMMONIA in the last 168 hours. Coagulation Profile: No results for input(s): INR, PROTIME in the last 168 hours. Cardiac Enzymes: Recent Labs  Lab 04/30/21 0057  CKTOTAL 43   BNP (last 3 results) No results for input(s): PROBNP in the last 8760 hours. HbA1C: No results for input(s): HGBA1C in the last 72 hours. CBG: No results for input(s): GLUCAP in the last 168 hours. Lipid Profile: No results for input(s): CHOL, HDL, LDLCALC, TRIG, CHOLHDL, LDLDIRECT in the last 72 hours. Thyroid Function Tests: No results for input(s): TSH, T4TOTAL,  FREET4, T3FREE, THYROIDAB in the last 72 hours. Anemia Panel: No results for input(s): VITAMINB12, FOLATE, FERRITIN, TIBC, IRON, RETICCTPCT in the last 72 hours. Sepsis Labs: No results for input(s): PROCALCITON, LATICACIDVEN in the last 168 hours.  Recent Results (from the past 240 hour(s))  Resp Panel by RT-PCR (Flu A&B, Covid) Nasopharyngeal Swab     Status: None   Collection Time: 04/30/21 12:19 AM   Specimen: Nasopharyngeal Swab; Nasopharyngeal(NP) swabs in vial transport medium  Result  Value Ref Range Status   SARS Coronavirus 2 by RT PCR NEGATIVE NEGATIVE Final    Comment: (NOTE) SARS-CoV-2 target nucleic acids are NOT DETECTED.  The SARS-CoV-2 RNA is generally detectable in upper respiratory specimens during the acute phase of infection. The lowest concentration of SARS-CoV-2 viral copies this assay can detect is 138 copies/mL. A negative result does not preclude SARS-Cov-2 infection and should not be used as the sole basis for treatment or other patient management decisions. A negative result may occur with  improper specimen collection/handling, submission of specimen other than nasopharyngeal swab, presence of viral mutation(s) within the areas targeted by this assay, and inadequate number of viral copies(<138 copies/mL). A negative result must be combined with clinical observations, patient history, and epidemiological information. The expected result is Negative.  Fact Sheet for Patients:  BloggerCourse.com  Fact Sheet for Healthcare Providers:  SeriousBroker.it  This test is no t yet approved or cleared by the [place] FDA and  has been authorized for detection and/or diagnosis of SARS-CoV-2 by FDA under an Emergency Use Authorization (EUA). This EUA will remain  in effect (meaning this test can be used) for the duration of the COVID-19 declaration under Section 564(b)(1) of the Act, 21 U.S.C.section  360bbb-3(b)(1), unless the authorization is terminated  or revoked sooner.       Influenza A by PCR NEGATIVE NEGATIVE Final   Influenza B by PCR NEGATIVE NEGATIVE Final    Comment: (NOTE) The Xpert Xpress SARS-CoV-2/FLU/RSV plus assay is intended as an aid in the diagnosis of influenza from Nasopharyngeal swab specimens and should not be used as a sole basis for treatment. Nasal washings and aspirates are unacceptable for Xpert Xpress SARS-CoV-2/FLU/RSV testing.  Fact Sheet for Patients: BloggerCourse.com  Fact Sheet for Healthcare Providers: SeriousBroker.it  This test is not yet approved or cleared by the [place] FDA and has been authorized for detection and/or diagnosis of SARS-CoV-2 by FDA under an Emergency Use Authorization (EUA). This EUA will remain in effect (meaning this test can be used) for the duration of the COVID-19 declaration under Section 564(b)(1) of the Act, 21 U.S.C. section 360bbb-3(b)(1), unless the authorization is terminated or revoked.  Performed at [facility], [address], [place], [place] [address]   Body fluid culture w Gram Stain     Status: None (Preliminary result)   Collection Time: 04/30/21  1:10 AM   Specimen: Synovium; Body Fluid  Result Value Ref Range Status   Specimen Description   Final    SYNOVIAL Performed at [facility], [address], [place], [place] [address]    Special Requests   Final    NONE Performed at [facility], [address], [place], [place] [address]    Gram Stain   Final    ABUNDANT WBC PRESENT,BOTH PMN AND MONONUCLEAR RARE GRAM POSITIVE COCCI Gram Stain Report Called to,Read Back By and Verified With: Mickie Bail, RN @ [phone] ON 04/30/21 Riesa Pope Performed at [facility], [address] [place], [place] [address]    Culture   Final    NO GROWTH 1 DAY Performed at [facility], [address], [place], [place] [address]    Report Status PENDING  Incomplete  Urine Culture     Status: Abnormal   Collection Time: 04/30/21  2:30 AM   Specimen: Urine, Clean Catch  Result Value Ref Range Status   Specimen Description   Final  URINE, CLEAN CATCH Performed at [facility], [address], [place], [place] [address]    Special Requests   Final    NONE Performed at [facility], [address], [place], [place] [address]    Culture MULTIPLE SPECIES PRESENT, SUGGEST RECOLLECTION (A)  Final   Report Status 05/01/2021 FINAL  Final  Surgical PCR screen     Status: Abnormal   Collection Time: 04/30/21 10:57 AM   Specimen: Nasal Mucosa; Nasal Swab  Result Value Ref Range Status   MRSA, PCR NEGATIVE NEGATIVE Final   Staphylococcus aureus POSITIVE (A) NEGATIVE Final    Comment: (NOTE) The Xpert SA Assay (FDA approved for NASAL specimens in patients 71 years of age and older), is one component of a comprehensive surveillance program. It is not intended to diagnose infection nor to guide or monitor treatment. Performed at [facility], [address], [place], [place] [address]       Radiology Studies: DG Knee Complete 4 Views Left  Result Date: 04/29/2021 CLINICAL DATA:  Knee pain and swelling, no known injury, initial encounter EXAM: LEFT KNEE - COMPLETE 4+ VIEW COMPARISON:  None. FINDINGS: No acute fracture or dislocation is noted. Minimal joint fluid is seen. Meniscal calcifications are identified. No other focal abnormality is seen. IMPRESSION: Minimal joint fluid. No acute bony abnormality is noted. Electronically Signed   By: Alcide Clever M.D.   On: 04/29/2021 23:48   DG Knee Complete 4 Views Right  Result Date: 04/29/2021 CLINICAL DATA:  Initial evaluation for acute pain and swelling. EXAM: RIGHT KNEE - COMPLETE 4+ VIEW COMPARISON:  None. FINDINGS: No acute fracture dislocation. Small  joint effusion present within the suprapatellar recess. Minimal juxta-articular spurring noted about the knee. Osseous mineralization normal. No discrete osseous lesions. No other visible soft tissue abnormality. IMPRESSION: 1. No acute osseous abnormality about the right knee. 2. Small joint effusion. 3. Minimal degenerative osteoarthrosis.  In Electronically Signed   By: Rise Mu M.D.   On: 04/29/2021 23:46    Scheduled Meds:  Chlorhexidine Gluconate Cloth  6 each Topical Daily   HYDROmorphone       mometasone-formoterol  2 puff Inhalation BID   mupirocin ointment  1 application Nasal BID   Continuous Infusions:  cefTRIAXone (ROCEPHIN)  IV 2 g (05/01/21 0546)   vancomycin 1,000 mg (05/01/21 0337)     LOS: 1 day   Time spent: 34 minutes   Hughie Closs, MD [facility]  05/01/2021, 9:46 AM   How to contact the [facility] Attending or Consulting provider 7A - 7P or covering provider during after hours 7P -7A, for this patient?  Check the care team in [facility] and look for a) attending/consulting TRH provider listed and b) the [facility] team listed. Page or secure chat 7A-7P. Log into [email or web address] and use [facility]'s universal password to access. If you do not have the password, please contact the hospital operator. Locate the [facility] provider you are looking for under [facility] and page to a number that you can be directly reached. If you still have difficulty reaching the provider, please page the [facility] (Director on Call) for the Hospitalists listed on amion for assistance.

## 2021-05-01 NOTE — Evaluation (Addendum)
Physical Therapy Evaluation Patient Details Name: Lori Black MRN: [id number] DOB: [birth year] Today's Date: 05/01/2021   History of Present Illness  35 yo female admitted with septic arthritis of bilateral knees. S/P scope/I&D 04/30/21. Hx of asthma.  Clinical Impression  On eval, pt required Min assist for mobility. She walked ~100 feet with a RW. Moderate pain with activity. ROM is limited bilaterally (pt feels some of this is due to ace wrap). Encouraged her to speak with RN about whether ace wrap could be loosened or removed. Will plan to follow and progress activity as able.     Follow Up Recommendations Home health PT    Equipment Recommendations  Rolling walker with 5" wheels; 3n1   Recommendations for Other Services       Precautions / Restrictions Precautions Precautions: Fall Restrictions Weight Bearing Restrictions: No RLE Weight Bearing: Weight bearing as tolerated LLE Weight Bearing: Weight bearing as tolerated      Mobility  Bed Mobility Overal bed mobility: Modified Independent       Supine to sit: HOB elevated          Transfers Overall transfer level: Needs assistance Equipment used: Rolling walker (2 wheeled) Transfers: Sit to/from Stand Sit to Stand: Min assist;From elevated surface         General transfer comment: Elevated bed 2* limited knee ROM bilaterally. Cues for safety, technique, hand/LE Placement. Assist to power up, control descent.  Ambulation/Gait Ambulation/Gait assistance: Min guard Gait Distance (Feet): 100 Feet Assistive device: Rolling walker (2 wheeled) Gait Pattern/deviations: Step-through pattern     General Gait Details: Min guard for safety. Cues for safety, proper use of RW.  Stairs            Wheelchair Mobility    Modified Rankin (Stroke Patients Only)       Balance Overall balance assessment: Needs assistance         Standing balance support: Bilateral upper extremity supported Standing  balance-Leahy Scale: Fair                               Pertinent Vitals/Pain Pain Assessment: 0-10 Pain Score: 5  Pain Location: bil knees Pain Descriptors / Indicators: Discomfort;Sore;Tightness Pain Intervention(s): Limited activity within patient's tolerance;Monitored during session;Ice applied;Repositioned    Home Living Family/patient expects to be discharged to:: Private residence Living Arrangements: Spouse/significant other;Children Available Help at Discharge: Family Type of Home: House       Home Layout: One level Home Equipment: None      Prior Function Level of Independence: Independent         Comments: wears wrist brace PRN     Hand Dominance        Extremity/Trunk Assessment   Upper Extremity Assessment Upper Extremity Assessment: Overall WFL for tasks assessed    Lower Extremity Assessment Lower Extremity Assessment: Generalized weakness (limited ROM bilaterally (R knee ROM a little better than L). Pt also feels restricted by ace wraps)    Cervical / Trunk Assessment Cervical / Trunk Assessment: Normal  Communication   Communication: No difficulties  Cognition Arousal/Alertness: Awake/alert Behavior During Therapy: WFL for tasks assessed/performed Overall Cognitive Status: Within Functional Limits for tasks assessed                                        General Comments  Exercises Total Joint Exercises Ankle Circles/Pumps: AROM;10 reps;Both Quad Sets: AROM;Both;10 reps Heel Slides: AAROM;Both;10 reps (pt used gait belt to assist) Straight Leg Raises: AROM;Both;10 reps   Assessment/Plan    PT Assessment Patient needs continued PT services  PT Problem List Decreased strength;Decreased mobility;Decreased activity tolerance;Decreased balance;Decreased knowledge of use of DME;Pain       PT Treatment Interventions DME instruction;Gait training;Therapeutic exercise;Balance training;Functional mobility  training;Therapeutic activities;Patient/family education    PT Goals (Current goals can be found in the Care Plan section)  Acute Rehab PT Goals Patient Stated Goal: regain PLOF PT Goal Formulation: With patient Time For Goal Achievement: 05/15/21 Potential to Achieve Goals: Good    Frequency Min 5X/week   Barriers to discharge        Co-evaluation               AM-PAC PT "6 Clicks" Mobility  Outcome Measure Help needed turning from your back to your side while in a flat bed without using bedrails?: None Help needed moving from lying on your back to sitting on the side of a flat bed without using bedrails?: None Help needed moving to and from a bed to a chair (including a wheelchair)?: A Little Help needed standing up from a chair using your arms (e.g., wheelchair or bedside chair)?: A Little Help needed to walk in hospital room?: A Little Help needed climbing 3-5 steps with a railing? : A Lot 6 Click Score: 19    End of Session Equipment Utilized During Treatment: Gait belt Activity Tolerance: Patient tolerated treatment well Patient left: in chair;with call bell/phone within reach   PT Visit Diagnosis: Other abnormalities of gait and mobility (R26.89);Pain Pain - Right/Left:  (bil) Pain - part of body: Knee    Time: 9798-9211 PT Time Calculation (min) (ACUTE ONLY): 38 min   Charges:   PT Evaluation $PT Eval Low Complexity: 1 Low PT Treatments $Gait Training: 8-22 mins $Therapeutic Exercise: 8-22 mins           Faye Ramsay, PT Acute Rehabilitation  Office: [phone] Pager: [phone]

## 2021-05-01 NOTE — Plan of Care (Signed)
  Problem: Activity: Goal: Risk for activity intolerance will decrease Outcome: Progressing   Problem: Nutrition: Goal: Adequate nutrition will be maintained Outcome: Progressing   Problem: Elimination: Goal: Will not experience complications related to bowel motility Outcome: Progressing Goal: Will not experience complications related to urinary retention Outcome: Progressing   Problem: Pain Managment: Goal: General experience of comfort will improve Outcome: Progressing   

## 2021-05-01 NOTE — Progress Notes (Signed)
Ortho POD 1 s/p incision and drainage of both knees  Synovial cell counts obtained yesterday at time of surgery suggested infection was progressing. Culture sensitivities still not back on initial aspiration early am 7/15 but would appear likely that MSSA will result. Did well with PT today.   Will change dressings tomorrow am. Anticipate d/c with picc for specific rx then conversion to oral after four weeks.  Myrene Galas, MD [facility], [facility] [phone]

## 2021-05-02 LAB — PROTEIN, BODY FLUID (OTHER): Total Protein, Body Fluid Other: 2.9 g/dL

## 2021-05-02 LAB — BASIC METABOLIC PANEL
Anion gap: 8 (ref 5–15)
BUN: 13 mg/dL (ref 6–20)
CO2: 27 mmol/L (ref 22–32)
Calcium: 8 mg/dL — ABNORMAL LOW (ref 8.9–10.3)
Chloride: 102 mmol/L (ref 98–111)
Creatinine, Ser: 0.7 mg/dL (ref 0.44–1.00)
GFR, Estimated: >60 mL/min (ref >60)
Glucose, Bld: 140 mg/dL — ABNORMAL HIGH (ref 70–99)
Potassium: 3.1 mmol/L — ABNORMAL LOW (ref 3.5–5.1)
Sodium: 137 mmol/L (ref 135–145)

## 2021-05-02 LAB — GLUCOSE, BODY FLUID OTHER: Glucose, Body Fluid Other: 25 mg/dL

## 2021-05-02 LAB — CBC WITH DIFFERENTIAL/PLATELET
Abs Immature Granulocytes: 0.04 10*3/uL (ref 0.00–0.07)
Basophils Absolute: 0 10*3/uL (ref 0.0–0.1)
Basophils Relative: 0 %
Eosinophils Absolute: 0 10*3/uL (ref 0.0–0.5)
Eosinophils Relative: 0 %
HCT: 25.3 % — ABNORMAL LOW (ref 36.0–46.0)
Hemoglobin: 7.6 g/dL — ABNORMAL LOW (ref 12.0–15.0)
Immature Granulocytes: 0 %
Lymphocytes Relative: 15 %
Lymphs Abs: 1.7 10*3/uL (ref 0.7–4.0)
MCH: 21.1 pg — ABNORMAL LOW (ref 26.0–34.0)
MCHC: 30 g/dL (ref 30.0–36.0)
MCV: 70.3 fL — ABNORMAL LOW (ref 80.0–100.0)
Monocytes Absolute: 1 10*3/uL (ref 0.1–1.0)
Monocytes Relative: 8 %
Neutro Abs: 8.8 10*3/uL — ABNORMAL HIGH (ref 1.7–7.7)
Neutrophils Relative %: 77 %
Platelets: 251 10*3/uL (ref 150–400)
RBC: 3.6 MIL/uL — ABNORMAL LOW (ref 3.87–5.11)
RDW: 16.9 % — ABNORMAL HIGH (ref 11.5–15.5)
WBC: 11.5 10*3/uL — ABNORMAL HIGH (ref 4.0–10.5)
nRBC: 0 % (ref 0.0–0.2)

## 2021-05-02 LAB — MAGNESIUM: Magnesium: 1.4 mg/dL — ABNORMAL LOW (ref 1.7–2.4)

## 2021-05-02 MED ORDER — ONDANSETRON 4 MG PO TBDP
4.0000 mg | ORAL_TABLET | Freq: Three times a day (TID) | ORAL | Status: DC | PRN
  Administered 2021-05-02 – 2021-05-05 (×3): 4 mg via ORAL
  Filled 2021-05-02 (×3): qty 1

## 2021-05-02 MED ORDER — POLYETHYLENE GLYCOL 3350 17 G PO PACK: 17.0000 g | PACK | Freq: Every day | ORAL | Status: DC | PRN

## 2021-05-02 MED ORDER — POTASSIUM CHLORIDE CRYS ER 20 MEQ PO TBCR
40.0000 meq | EXTENDED_RELEASE_TABLET | Freq: Once | ORAL | Status: AC
  Administered 2021-05-02: 40 meq via ORAL
  Filled 2021-05-02: qty 2

## 2021-05-02 MED ORDER — HYDROCODONE-ACETAMINOPHEN 5-325 MG PO TABS
1.0000 | ORAL_TABLET | ORAL | Status: DC | PRN
Start: 2021-05-02 — End: 2021-05-04
  Administered 2021-05-02 (×2): 1 via ORAL
  Filled 2021-05-02 (×2): qty 1

## 2021-05-02 MED ORDER — MAGNESIUM SULFATE 4 GM/100ML IV SOLN
4.0000 g | Freq: Once | INTRAVENOUS | Status: AC
  Administered 2021-05-02: 4 g via INTRAVENOUS
  Filled 2021-05-02: qty 100

## 2021-05-02 NOTE — Progress Notes (Signed)
Physical Therapy Treatment Patient Details Name: Lori Black MRN: [id number] DOB: Feb 27, 1986 Today's Date: 05/02/2021    History of Present Illness 35 yo female admitted with septic arthritis of bilateral knees. S/P scope/I&D 04/30/21. Hx of asthma.    PT Comments    Pt reports increased pain today compared to yesterday. She also c/o some nausea. Some minimal bloody drainage from scope site-made RN aware. Encouraged pt to work on knee ROM exercises in bed. Will continue to follow and progress activity as tolerated.    Follow Up Recommendations  Home health PT;Supervision for mobility/OOB (pt politely declines HHPT f/u)     Equipment Recommendations  Rolling walker with 5" wheels;3in1 (PT)    Recommendations for Other Services       Precautions / Restrictions Precautions Precautions: Fall Restrictions Weight Bearing Restrictions: No RLE Weight Bearing: Weight bearing as tolerated LLE Weight Bearing: Weight bearing as tolerated    Mobility  Bed Mobility Overal bed mobility: Modified Independent       Supine to sit: HOB elevated     General bed mobility comments: Increased time.    Transfers Overall transfer level: Needs assistance Equipment used: Rolling walker (2 wheeled) Transfers: Sit to/from Stand Sit to Stand: From elevated surface;Min assist         General transfer comment: Elevated bed 2* limited knee ROM bilaterally. Cues for safety, technique, hand/LE Placement. Assist to power up, control descent. Min A from low toilet.  Ambulation/Gait Ambulation/Gait assistance: Min guard Gait Distance (Feet): 50 Feet Assistive device: Rolling walker (2 wheeled) Gait Pattern/deviations: Step-through pattern     General Gait Details: Min guard for safety. Cues for safety, proper use of RW. Decreased distance today 2* pain, nausea   Stairs             Wheelchair Mobility    Modified Rankin (Stroke Patients Only)       Balance Overall balance  assessment: Needs assistance         Standing balance support: Bilateral upper extremity supported Standing balance-Leahy Scale: Fair                              Cognition Arousal/Alertness: Awake/alert Behavior During Therapy: WFL for tasks assessed/performed Overall Cognitive Status: Within Functional Limits for tasks assessed                                        Exercises Total Joint Exercises Ankle Circles/Pumps: AROM;Both;10 reps Quad Sets: AROM;Both;10 reps Heel Slides: AAROM;5 reps;Supine (pt used gait belt) Hip ABduction/ADduction: AROM;AAROM;Both;5 reps Straight Leg Raises: AAROM;Both;5 reps (pt used gait belt)    General Comments        Pertinent Vitals/Pain Pain Assessment: 0-10 Pain Score: 8  Pain Location: bil knees Pain Descriptors / Indicators: Discomfort;Sore;Tightness Pain Intervention(s): Limited activity within patient's tolerance;Monitored during session;Repositioned;Ice applied;Patient requesting pain meds-RN notified    Home Living                      Prior Function            PT Goals (current goals can now be found in the care plan section) Progress towards PT goals: Progressing toward goals    Frequency    Min 5X/week      PT Plan Current plan remains appropriate    Co-evaluation  AM-PAC PT "6 Clicks" Mobility   Outcome Measure  Help needed turning from your back to your side while in a flat bed without using bedrails?: None Help needed moving from lying on your back to sitting on the side of a flat bed without using bedrails?: None Help needed moving to and from a bed to a chair (including a wheelchair)?: A Little Help needed standing up from a chair using your arms (e.g., wheelchair or bedside chair)?: A Little Help needed to walk in hospital room?: A Little Help needed climbing 3-5 steps with a railing? : A Lot 6 Click Score: 19    End of Session Equipment  Utilized During Treatment: Gait belt Activity Tolerance: Patient limited by pain Patient left: in bed;with call bell/phone within reach   PT Visit Diagnosis: Other abnormalities of gait and mobility (R26.89);Pain Pain - Right/Left:  (bil) Pain - part of body: Knee     Time: 0952-1029 PT Time Calculation (min) (ACUTE ONLY): 37 min  Charges:  $Gait Training: 8-22 mins $Therapeutic Exercise: 8-22 mins                         Faye Ramsay, PT Acute Rehabilitation  Office: [phone] Pager: [phone]

## 2021-05-02 NOTE — Plan of Care (Signed)
  Problem: Clinical Measurements: Goal: Respiratory complications will improve Outcome: Progressing   Problem: Clinical Measurements: Goal: Cardiovascular complication will be avoided Outcome: Progressing   Problem: Elimination: Goal: Will not experience complications related to bowel motility Outcome: Progressing   Problem: Skin Integrity: Goal: Risk for impaired skin integrity will decrease Outcome: Progressing   

## 2021-05-02 NOTE — Progress Notes (Signed)
PROGRESS NOTE    Lori Black  ZOX:[id number] DOB: 11/26/1985 DOA: 04/29/2021 PCP: Nyoka Cowden, MD   Brief Narrative:  Lori Black is a 35 y.o. female with history of asthma and chronic anemia presented to the ER with complaints of bilateral knee pain, symptom onset duration of only 1 to 2 days.  No other complaint.  She was admitted under hospitalist service at [facility] long with a diagnosis of bilateral septic knee arthritis but then was transported to [facility] and underwent incision and drainage of bilateral knees by Dr. Carola Frost and now back to [facility].  Assessment & Plan:   Principal Problem:   Septic arthritis of knee, bilateral (HCC) Active Problems:   Chronic asthma, mild persistent, uncomplicated   Septic arthritis (HCC)   Septic arthritis of bilateral knee: gram +organism seen on gram stain S/p I&D bilateral knee by Dr. Carola Frost on 04/30/2021.   -back to the OR on 7/16 for aspiration -ceftriaxone and vancomycin - will need 4 weeks IV and then oral abx -unclear source, may need dental consult as no way to get orthopantogram at [facility], check for GC -may need ID consult  Chronic microcytic/possible iron deficiency anemia:  -transfuse for <7  Hypokalemia:  -replete  Hypomagnesemia:  -replete  Mild intermittent asthma: Controlled.  She is not hypoxic.  Continue home medications.   DVT prophylaxis: Place and maintain sequential compression device Start: 05/01/21 0022 SCDs Start: 04/30/21 0853   Code Status: Full Code    Status is: Inpatient  Remains inpatient appropriate because:Inpatient level of care appropriate due to severity of illness  Dispo: The patient is from: Home              Anticipated d/c is to: Home              Patient currently is not medically stable to d/c.   Difficult to place patient No   Consultants:  Orthopedics  Procedures:  I&D bilateral knees  Antimicrobials:  Anti-infectives (From admission, onward)    Start     Dose/Rate  Route Frequency Ordered Stop   05/01/21 0400  cefTRIAXone (ROCEPHIN) 2 g in sodium chloride 0.9 % 100 mL IVPB        2 g 200 mL/hr over 30 Minutes Intravenous Every 24 hours 04/30/21 0858     04/30/21 2015  ceFAZolin (ANCEF) IVPB 2g/100 mL premix        2 g 200 mL/hr over 30 Minutes Intravenous On call to O.R. 04/30/21 1853 04/30/21 2003   04/30/21 1855  ceFAZolin (ANCEF) 2-4 GM/100ML-% IVPB       Note to Pharmacy: [facility]   : cabinet override      04/30/21 1855 04/30/21 2040   04/30/21 1600  vancomycin (VANCOCIN) IVPB 1000 mg/200 mL premix        1,000 mg 100 mL/hr over 120 Minutes Intravenous Every 12 hours 04/30/21 0450     04/30/21 0345  vancomycin (VANCOCIN) IVPB 1000 mg/200 mL premix        1,000 mg 200 mL/hr over 60 Minutes Intravenous  Once 04/30/21 0330 04/30/21 0547   04/30/21 0330  cefTRIAXone (ROCEPHIN) 2 g in sodium chloride 0.9 % 100 mL IVPB        2 g 200 mL/hr over 30 Minutes Intravenous  Once 04/30/21 0319 04/30/21 0447          Subjective: C/o IV hurting  Objective: Vitals:   05/01/21 1025 05/01/21 1357 05/01/21 2200 05/02/21 0600  BP: 117/76  113/69 (!) 109/55 111/68  Pulse: 97 99 97 85  Resp: 18 18 16 14   Temp: 98 F (36.7 C) 98.3 F (36.8 C) 98.4 F (36.9 C) 98.3 F (36.8 C)  TempSrc: Oral Oral Oral Oral  SpO2: 100% 94% 100% 100%  Weight:      Height:        Intake/Output Summary (Last 24 hours) at 05/02/2021 1012 Last data filed at 05/02/2021 05/04/2021 Gross per 24 hour  Intake 1200 ml  Output --  Net 1200 ml   Filed Weights   04/29/21 2219  Weight: 68 kg    Examination:   General: Appearance:    Well developed, well nourished female in no acute distress     Lungs:     Clear to auscultation bilaterally, respirations unlabored  Heart:    Normal heart rate. Normal rhythm. No murmurs, rubs, or gallops.    MS:   All extremities are intact.    Neurologic:   Awake, alert, oriented x 3. No apparent focal neurological           defect.        Data Reviewed: I have personally reviewed following labs and imaging studies  CBC: Recent Labs  Lab 04/29/21 2247 05/01/21 0743 05/02/21 0303  WBC 9.1 11.7* 11.5*  NEUTROABS 6.6 10.4* 8.8*  HGB 8.6* 8.9* 7.6*  HCT 29.1* 29.8* 25.3*  MCV 70.3* 70.6* 70.3*  PLT 259 259 251   Basic Metabolic Panel: Recent Labs  Lab 04/29/21 2247 05/01/21 0743 05/02/21 0303  NA 136 138 137  K 2.9* 3.3* 3.1*  CL 97* 98 102  CO2 27 29 27   GLUCOSE 187* 168* 140*  BUN 11 11 13   CREATININE 0.66 0.67 0.70  CALCIUM 8.9 8.3* 8.0*  MG  --  1.0* 1.4*   GFR: Estimated Creatinine Clearance: 87.5 mL/min (by C-G formula based on SCr of 0.7 mg/dL). Liver Function Tests: Recent Labs  Lab 04/29/21 2247  AST 15  ALT 13  ALKPHOS 63  BILITOT 1.2  PROT 7.3  ALBUMIN 4.1   No results for input(s): LIPASE, AMYLASE in the last 168 hours. No results for input(s): AMMONIA in the last 168 hours. Coagulation Profile: No results for input(s): INR, PROTIME in the last 168 hours. Cardiac Enzymes: Recent Labs  Lab 04/30/21 0057  CKTOTAL 43   BNP (last 3 results) No results for input(s): PROBNP in the last 8760 hours. HbA1C: No results for input(s): HGBA1C in the last 72 hours. CBG: No results for input(s): GLUCAP in the last 168 hours. Lipid Profile: No results for input(s): CHOL, HDL, LDLCALC, TRIG, CHOLHDL, LDLDIRECT in the last 72 hours. Thyroid Function Tests: No results for input(s): TSH, T4TOTAL, FREET4, T3FREE, THYROIDAB in the last 72 hours. Anemia Panel: No results for input(s): VITAMINB12, FOLATE, FERRITIN, TIBC, IRON, RETICCTPCT in the last 72 hours. Sepsis Labs: No results for input(s): PROCALCITON, LATICACIDVEN in the last 168 hours.  Recent Results (from the past 240 hour(s))  Resp Panel by RT-PCR (Flu A&B, Covid) Nasopharyngeal Swab     Status: None   Collection Time: 04/30/21 12:19 AM   Specimen: Nasopharyngeal Swab; Nasopharyngeal(NP) swabs in vial transport medium   Result Value Ref Range Status   SARS Coronavirus 2 by RT PCR NEGATIVE NEGATIVE Final    Comment: (NOTE) SARS-CoV-2 target nucleic acids are NOT DETECTED.  The SARS-CoV-2 RNA is generally detectable in upper respiratory specimens during the acute phase of infection. The lowest concentration of SARS-CoV-2 viral copies this  assay can detect is 138 copies/mL. A negative result does not preclude SARS-Cov-2 infection and should not be used as the sole basis for treatment or other patient management decisions. A negative result may occur with  improper specimen collection/handling, submission of specimen other than nasopharyngeal swab, presence of viral mutation(s) within the areas targeted by this assay, and inadequate number of viral copies(<138 copies/mL). A negative result must be combined with clinical observations, patient history, and epidemiological information. The expected result is Negative.  Fact Sheet for Patients:  BloggerCourse.comhttps://www.fda.gov/media/152166/download  Fact Sheet for Healthcare Providers:  SeriousBroker.ithttps://www.fda.gov/media/152162/download  This test is no t yet approved or cleared by the [place] FDA and  has been authorized for detection and/or diagnosis of SARS-CoV-2 by FDA under an Emergency Use Authorization (EUA). This EUA will remain  in effect (meaning this test can be used) for the duration of the COVID-19 declaration under Section 564(b)(1) of the Act, 21 U.S.C.section 360bbb-3(b)(1), unless the authorization is terminated  or revoked sooner.       Influenza A by PCR NEGATIVE NEGATIVE Final   Influenza B by PCR NEGATIVE NEGATIVE Final    Comment: (NOTE) The Xpert Xpress SARS-CoV-2/FLU/RSV plus assay is intended as an aid in the diagnosis of influenza from Nasopharyngeal swab specimens and should not be used as a sole basis for treatment. Nasal washings and aspirates are unacceptable for Xpert Xpress SARS-CoV-2/FLU/RSV testing.  Fact Sheet for  Patients: BloggerCourse.comhttps://www.fda.gov/media/152166/download  Fact Sheet for Healthcare Providers: SeriousBroker.ithttps://www.fda.gov/media/152162/download  This test is not yet approved or cleared by the [place] FDA and has been authorized for detection and/or diagnosis of SARS-CoV-2 by FDA under an Emergency Use Authorization (EUA). This EUA will remain in effect (meaning this test can be used) for the duration of the COVID-19 declaration under Section 564(b)(1) of the Act, 21 U.S.C. section 360bbb-3(b)(1), unless the authorization is terminated or revoked.  Performed at [facility], [address] [place], [place] [address]   Body fluid culture w Gram Stain     Status: None (Preliminary result)   Collection Time: 04/30/21  1:10 AM   Specimen: Synovium; Body Fluid  Result Value Ref Range Status   Specimen Description   Final    SYNOVIAL Performed at [facility], [address] [place], [place] [address]    Special Requests   Final    NONE Performed at [facility], [address] [place], [place] [address]    Gram Stain   Final    ABUNDANT WBC PRESENT,BOTH PMN AND MONONUCLEAR RARE GRAM POSITIVE COCCI Gram Stain Report Called to,Read Back By and Verified With: Mickie BailBRIANNA SAVOIE, RN @ [phone] ON 04/30/21 Riesa Pope VARNER Performed at [facility], [address] [place], [place] [address]    Culture   Final    NO GROWTH 2 DAYS Performed at [facility], [address] [place], [place] [address]    Report Status PENDING  Incomplete  Urine Culture     Status: Abnormal   Collection Time: 04/30/21  2:30 AM   Specimen: Urine, Clean Catch  Result Value Ref Range Status   Specimen Description   Final    URINE, CLEAN CATCH Performed at [facility], [address] [place], [place] [address]    Special Requests   Final    NONE Performed at [facility], [address]  [place], [place] [address]    Culture MULTIPLE SPECIES PRESENT, SUGGEST RECOLLECTION (A)  Final   Report Status 05/01/2021 FINAL  Final  Blood culture (routine x 2)     Status: None (Preliminary result)   Collection Time: 04/30/21  3:24 AM   Specimen: BLOOD  Result Value Ref Range Status   Specimen Description   Final    BLOOD LEFT ANTECUBITAL Performed at [facility], [address], [place], [place] [address]    Special Requests   Final    BOTTLES DRAWN AEROBIC AND ANAEROBIC Blood Culture results may not be optimal due to an inadequate volume of blood received in culture bottles Performed at [facility], [address], [place], [place] [address]    Culture   Final    NO GROWTH 1 DAY Performed at [facility], [address] [place], [place] [address]    Report Status PENDING  Incomplete  Blood culture (routine x 2)     Status: None (Preliminary result)   Collection Time: 04/30/21  3:36 AM   Specimen: BLOOD  Result Value Ref Range Status   Specimen Description   Final    BLOOD RIGHT ANTECUBITAL Performed at [facility], [address], [place], [place] [address]    Special Requests   Final    BOTTLES DRAWN AEROBIC AND ANAEROBIC Blood Culture adequate volume Performed at [facility], [address], [place], [place] [address]    Culture   Final    NO GROWTH 1 DAY Performed at [facility], [address], [place], [place] [address]    Report Status PENDING  Incomplete  Surgical PCR screen     Status: Abnormal   Collection Time: 04/30/21 10:57 AM   Specimen: Nasal Mucosa; Nasal Swab  Result Value Ref Range Status   MRSA, PCR NEGATIVE NEGATIVE Final   Staphylococcus aureus POSITIVE (A) NEGATIVE Final    Comment: (NOTE) The Xpert SA Assay (FDA approved for NASAL specimens in patients 2 years of age and older), is one component of a comprehensive surveillance program. It is not intended  to diagnose infection nor to guide or monitor treatment. Performed at [facility], [address] [place], [place] [address]   Body fluid culture w Gram Stain     Status: None (Preliminary result)   Collection Time: 04/30/21 10:27 PM   Specimen: Synovial, Right Knee; Body Fluid  Result Value Ref Range Status   Specimen Description SYNOVIAL  Final   Special Requests RIGHT KNEE  Final   Gram Stain PENDING  Incomplete   Culture   Final    NO GROWTH 1 DAY Performed at [facility], [address], [place], [place] [address]    Report Status PENDING  Incomplete  Body fluid culture w Gram Stain     Status: None (Preliminary result)   Collection Time: 04/30/21 10:28 PM   Specimen: Synovial, Left Knee; Body Fluid  Result Value Ref Range Status   Specimen Description Synov, Knee  Final   Special Requests NONE  Final   Gram Stain PENDING  Incomplete   Culture   Final    CULTURE REINCUBATED FOR BETTER GROWTH Performed at [facility], [address], [place], [place] [address]    Report Status PENDING  Incomplete      Radiology Studies: No results found.  Scheduled Meds:  Chlorhexidine Gluconate Cloth  6 each Topical Daily   mometasone-formoterol  2 puff Inhalation BID   mupirocin ointment  1 application  Nasal BID   Continuous Infusions:  cefTRIAXone (ROCEPHIN)  IV Stopped (05/02/21 7616)   magnesium sulfate bolus IVPB 4 g (05/02/21 0823)   vancomycin Stopped (05/02/21 0656)     LOS: 2 days   Time spent: 34 minutes   Joseph Art, DO [facility]  05/02/2021, 10:12 AM   How to contact the [facility] Attending or Consulting provider 7A - 7P or covering provider during after hours 7P -7A, for this patient?  Check the care team in [facility] and look for a) attending/consulting TRH provider listed and b) the [facility] team listed. Page or secure chat 7A-7P. Log into [email or web address] and use [place]'s universal password to access. If you do not have the  password, please contact the hospital operator. Locate the [facility] provider you are looking for under [facility] and page to a number that you can be directly reached. If you still have difficulty reaching the provider, please page the [facility] (Director on Call) for the Hospitalists listed on amion for assistance.

## 2021-05-02 NOTE — Progress Notes (Addendum)
Orthopaedic Trauma Service (OTS)  2 Days Post-Op Procedure(s) (LRB): ARTHROSCOPY KNEE BILATERAL (Bilateral)  Subjective: Patient reports pain as moderate and improving .    Objective: Current Vitals Blood pressure 111/68, pulse 85, temperature 98.3 F (36.8 C), temperature source Oral, resp. rate 14, height 5\' 5"  (1.651 m), weight 68 kg, last menstrual period 04/25/2021, SpO2 100 %. Vital signs in last 24 hours: Temp:  [98 F (36.7 C)-98.4 F (36.9 C)] 98.3 F (36.8 C) (07/17 0600) Pulse Rate:  [85-99] 85 (07/17 0600) Resp:  [14-18] 14 (07/17 0600) BP: (109-117)/(55-76) 111/68 (07/17 0600) SpO2:  [94 %-100 %] 100 % (07/17 0600)  Intake/Output from previous day: 07/16 0701 - 07/17 0700 In: 780 [P.O.:480; IV Piggyback:300] Out: -   LABS Recent Labs    04/29/21 2247 05/01/21 0743 05/02/21 0303  HGB 8.6* 8.9* 7.6*   Recent Labs    05/01/21 0743 05/02/21 0303  WBC 11.7* 11.5*  RBC 4.22 3.60*  HCT 29.8* 25.3*  PLT 259 251   Recent Labs    05/01/21 0743 05/02/21 0303  NA 138 137  K 3.3* 3.1*  CL 98 102  CO2 29 27  BUN 11 13  CREATININE 0.67 0.70  GLUCOSE 168* 140*  CALCIUM 8.3* 8.0*   No results for input(s): LABPT, INR in the last 72 hours.   Physical Exam Patient reports bilateral upper jaw broken molars for last few months; these are visible without gross erythema or any discernible purulence R&LLE   Dressings intact, clean, dry and were both removed  No redness, no drainage  Edema/ swelling controlled and minimal  Sens: DPN, SPN, TN intact  Motor: EHL, FHL, and lessor toe ext and flex all intact grossly  Brisk cap refill, warm to touch  Motion improved bilaterally  Assessment/Plan: 2 Days Post-Op Procedure(s) (LRB): ARTHROSCOPY KNEE BILATERAL (Bilateral) 1. PT/OT with WBAT and AROM 2. DVT proph with active mobilization; compression stockings were removed 3. D/c morphine and transition to PO meds 4. Recommend PICC for 4 weeks then oral 5.  Could consider ID consult for atypical presentation but given broken teeth could defer in lieu of DENTAL EVALUATION as this appears to be the most likely source 6. F/u 8-14 days  05/04/21, MD [facility], [facility] [phone]

## 2021-05-02 NOTE — Op Note (Signed)
NAMEKATRINIA, Black MEDICAL RECORD NO: [id number] ACCOUNT NO: [id number] DATE OF BIRTH: 1985/11/22 FACILITY: [id number] PHYSICIAN: Doralee Albino. Carola Frost, MD  Operative Report   DATE OF PROCEDURE: 04/30/2021  PREOPERATIVE DIAGNOSIS:  Bilateral knee septic arthritis.  POSTOPERATIVE DIAGNOSIS:  Bilateral knee septic arthritis.  PROCEDURES:   1.  Aspiration of right knee joint. 2.  Aspiration of left knee joint. 3.  Arthroscopic lavage of the right knee for infection. 4.  Arthroscopic lavage of the left knee for infection.  SURGEON:  Doralee Albino. Carola Frost, MD.  ASSISTANT:  None.  ANESTHESIA:  General.  COMPLICATIONS:  Malfunctioning arthroscopic equipment, specifically the light source.  SPECIMENS:  Two, one from each knee for cell count, Gram stain and anaerobic and aerobic culture sent to micro.  DISPOSITION:  To PACU.  CONDITION:  Stable.  BRIEF SUMMARY OF INDICATIONS FOR PROCEDURE:  The patient is a 35 year old female with no significant past medical history.  Denies IV drug use, STDs, diabetes, recent infection or any other precursor that might suggest or predispose to a joint infection.   Aspiration of the knees was performed in the Emergency Department, which demonstrated a cell count of only 1500 white cells, but gram-positive cocci.  Because of these results and her clinical symptoms, which were consistent with progressive knee pain,  swelling and warmth, that incision and drainage was indicated.  I discussed with her the risk of persistent infection, recurrent infection, nerve injury, vessel injury, need for further surgery and others.  She did provide consent to proceed.  BRIEF SUMMARY OF PROCEDURE:  The patient was taken to the operating room where general anesthesia was induced.  Both lower extremities were then prepped and draped in the usual sterile fashion beginning with a chlorhexidine wash and Betadine scrub and  paint.  Because of her somewhat atypical clinical  presentation, specifically with low number of white cells in her cell count, a repeat aspiration was indicated prior to beginning the surgery and 18-gauge was used to withdrawal 11 mL of rather  normal-appearing joint fluid from the right knee and a slightly more viscous or turbid appearance of joint fluid from the left knee, though it did continue to be straw-colored and was by no means frankly purulent.  We then proceeded with marking of the  portals.  Timeout was held.  The portal sites were injected with 0.25% Marcaine with epinephrine.  The portal incisions were made and the cannula for the camera inserted into the knee joint and then with the trocar and then the cannula carefully placed.   Unfortunately, the light source cord was malfunctioning and we could not obtain any visualization within the joint.  An additional set of instruments was pulled as well as a new light cord and these adjustments did not resolve the problem and we were  unable to again perform a diagnostic arthroscopy of either knee.  Given these limitations, I made an additional superolateral portal on the right side and inserted one of the cannulas for inflow and then used the inferolateral portal for outflow.  I was  careful to avoid any contact with the joint surface while thoroughly irrigating the joint and did take the knee through a gentle arc of motion of about 30 degrees, nearly continuously during this lavage of 4500 mL of saline.  The arthroscopic cannulas  were then removed from the right knee and inserted into the left knee, which was more swollen and clinically concerning knee.  So, I did not wish to introduce  any bacteria into right from the left, but had little concern going from right to left.  In  similar fashion, a superolateral portal was established and then just the anterior medial portal through which the suction cannula was carefully placed around the notch again.  The 4500 mL of saline were passed through the  knee while taking through a  gentle range of motion.  I then closed the portals in the standard fashion with 3-0 simple nylon suture.  Again, there was no gross purulence encountered and the aspiration, though in clinical appearance suggestive of inflammation did not overtly suggest  infection.  Sterile gently compressive dressings were applied from foot to thigh.  The patient awakened from anesthesia and transported to the PACU in stable condition.  PROGNOSIS: The patient remains on ceftriaxone and vancomycin, which she did receive an appropriate preoperative dose.  We will follow the cell count and culture.  I have explained to the family the malfunctioning equipment, which was supremely  disappointing as the hope for taking advantage of this opportunity to perform a full arthroscopic evaluation, particularly in light of her atypical presentation.  She will be weightbearing as tolerated and remain under the care of the primary service.   PAA D: 05/01/2021 11:22:53 pm T: 05/02/2021 1:32:00 am  JOB: [id number]/ [id number]

## 2021-05-02 NOTE — Plan of Care (Signed)

## 2021-05-03 ENCOUNTER — Encounter (HOSPITAL_COMMUNITY): Payer: Self-pay | Admitting: Internal Medicine

## 2021-05-03 ENCOUNTER — Inpatient Hospital Stay (HOSPITAL_COMMUNITY): Payer: Medicare Other

## 2021-05-03 ENCOUNTER — Inpatient Hospital Stay: Payer: Self-pay

## 2021-05-03 DIAGNOSIS — M009 Pyogenic arthritis, unspecified: Secondary | ICD-10-CM

## 2021-05-03 LAB — BASIC METABOLIC PANEL
Anion gap: 8 (ref 5–15)
BUN: 13 mg/dL (ref 6–20)
CO2: 31 mmol/L (ref 22–32)
Calcium: 8.8 mg/dL — ABNORMAL LOW (ref 8.9–10.3)
Chloride: 98 mmol/L (ref 98–111)
Creatinine, Ser: 0.64 mg/dL (ref 0.44–1.00)
GFR, Estimated: >60 mL/min (ref >60)
Glucose, Bld: 123 mg/dL — ABNORMAL HIGH (ref 70–99)
Potassium: 3.6 mmol/L (ref 3.5–5.1)
Sodium: 137 mmol/L (ref 135–145)

## 2021-05-03 LAB — CBC
HCT: 26 % — ABNORMAL LOW (ref 36.0–46.0)
Hemoglobin: 7.5 g/dL — ABNORMAL LOW (ref 12.0–15.0)
MCH: 20.7 pg — ABNORMAL LOW (ref 26.0–34.0)
MCHC: 28.8 g/dL — ABNORMAL LOW (ref 30.0–36.0)
MCV: 71.8 fL — ABNORMAL LOW (ref 80.0–100.0)
Platelets: 261 10*3/uL (ref 150–400)
RBC: 3.62 MIL/uL — ABNORMAL LOW (ref 3.87–5.11)
RDW: 17 % — ABNORMAL HIGH (ref 11.5–15.5)
WBC: 8.3 10*3/uL (ref 4.0–10.5)
nRBC: 0 % (ref 0.0–0.2)

## 2021-05-03 LAB — IRON AND TIBC
Iron: 12 ug/dL — ABNORMAL LOW (ref 28–170)
Saturation Ratios: 4 % — ABNORMAL LOW (ref 10.4–31.8)
TIBC: 313 ug/dL (ref 250–450)
UIBC: 301 ug/dL

## 2021-05-03 LAB — BODY FLUID CULTURE W GRAM STAIN: Culture: NO GROWTH

## 2021-05-03 LAB — GC/CHLAMYDIA PROBE AMP (~~LOC~~) NOT AT ARMC
Chlamydia: NEGATIVE
Comment: NEGATIVE
Comment: NORMAL
Neisseria Gonorrhea: NEGATIVE

## 2021-05-03 LAB — FERRITIN: Ferritin: 17 ng/mL (ref 11–307)

## 2021-05-03 LAB — MAGNESIUM: Magnesium: 1.2 mg/dL — ABNORMAL LOW (ref 1.7–2.4)

## 2021-05-03 LAB — VANCOMYCIN, PEAK: Vancomycin Pk: 40 ug/mL (ref 30–40)

## 2021-05-03 MED ORDER — IOHEXOL 350 MG/ML SOLN
60.0000 mL | Freq: Once | INTRAVENOUS | Status: AC | PRN
  Administered 2021-05-03: 60 mL via INTRAVENOUS

## 2021-05-03 MED ORDER — SODIUM CHLORIDE 0.9% FLUSH: 10.0000 mL | INTRAVENOUS | Status: DC | PRN

## 2021-05-03 MED ORDER — PHENOL 1.4 % MT LIQD
1.0000 | OROMUCOSAL | Status: DC | PRN
  Administered 2021-05-03: 1 via OROMUCOSAL
  Filled 2021-05-03: qty 177

## 2021-05-03 NOTE — Progress Notes (Signed)
Peripherally Inserted Central Catheter Placement  The IV Nurse has discussed with the patient and/or persons authorized to consent for the patient, the purpose of this procedure and the potential benefits and risks involved with this procedure.  The benefits include less needle sticks, lab draws from the catheter, and the patient may be discharged home with the catheter. Risks include, but not limited to, infection, bleeding, blood clot (thrombus formation), and puncture of an artery; nerve damage and irregular heartbeat and possibility to perform a PICC exchange if needed/ordered by physician.  Alternatives to this procedure were also discussed.  Bard Power PICC patient education guide, fact sheet on infection prevention and patient information card has been provided to patient /or left at bedside.    PICC Placement Documentation  PICC Single Lumen 05/03/21 Right Basilic 38 cm 0 cm (Active)  Indication for Insertion or Continuance of Line Home intravenous therapies (PICC only) 05/03/21 2033  Exposed Catheter (cm) 0 cm 05/03/21 2033  Site Assessment Clean;Dry;Intact 05/03/21 2033  Line Status Flushed;Saline locked;Blood return noted 05/03/21 2033  Dressing Type Transparent 05/03/21 2033  Dressing Status Clean;Dry;Intact 05/03/21 2033  Antimicrobial disc in place? Yes 05/03/21 2033  Safety Lock Not Applicable 05/03/21 2033  Line Care Connections checked and tightened 05/03/21 2033  Line Adjustment (NICU/IV Team Only) No 05/03/21 2033  Dressing Intervention New dressing 05/03/21 2033  Dressing Change Due 05/10/21 05/03/21 2033       Thomos Domine, Lajean Manes 05/03/2021, 8:34 PM

## 2021-05-03 NOTE — Care Management Important Message (Signed)
Important Message  Patient Details IM Letter given to the Patient. Name: Lori Black MRN: [id number] Date of Birth: 03/20/1986   Medicare Important Message Given:  Yes     Caren Macadam 05/03/2021, 12:28 PM

## 2021-05-03 NOTE — Progress Notes (Signed)
PROGRESS NOTE    Lori Black  OVZ:[id number] DOB: 1986/02/12 DOA: 04/29/2021 PCP: Nyoka Cowden, MD   Brief Narrative:  Lori Black is a 35 y.o. female with history of asthma and chronic anemia presented to the ER with complaints of bilateral knee pain, symptom onset duration of only 1 to 2 days.  No other complaint.  She was admitted under hospitalist service at [facility] long with a diagnosis of bilateral septic knee arthritis but then was transported to [facility] and underwent incision and drainage of bilateral knees by Dr. Carola Frost and now back to [facility].  Assessment & Plan:   Principal Problem:   Septic arthritis of knee, bilateral (HCC) Active Problems:   Chronic asthma, mild persistent, uncomplicated   Septic arthritis (HCC)   Septic arthritis of bilateral knee: gram + organism seen on gram stain S/p I&D bilateral knee by Dr. Carola Frost on 04/30/2021.   -back to the OR on 7/16 for aspiration -ceftriaxone and vancomycin - will need 4 weeks IV and then oral abx per ortho -unclear source, may need dental consult as no way to get orthopantogram at [facility] but will check CT scan, check for GC -ID consult  Chronic microcytic/possible iron deficiency anemia:  -transfuse for <7 -replace iron  when infection controlled  Hypokalemia:  -replete  Hypomagnesemia:  -replete  Mild intermittent asthma: Controlled.  She is not hypoxic.  Continue home medications.   DVT prophylaxis: Place and maintain sequential compression device Start: 05/01/21 0022 SCDs Start: 04/30/21 0853   Code Status: Full Code    Status is: Inpatient  Remains inpatient appropriate because:Inpatient level of care appropriate due to severity of illness  Dispo: The patient is from: Home              Anticipated d/c is to: Home              Patient currently is not medically stable to d/c.   Difficult to place patient No   Consultants:  Orthopedics ID  Procedures:  I&D bilateral knees  Antimicrobials:   Anti-infectives (From admission, onward)    Start     Dose/Rate Route Frequency Ordered Stop   05/01/21 0400  cefTRIAXone (ROCEPHIN) 2 g in sodium chloride 0.9 % 100 mL IVPB        2 g 200 mL/hr over 30 Minutes Intravenous Every 24 hours 04/30/21 0858     04/30/21 2015  ceFAZolin (ANCEF) IVPB 2g/100 mL premix        2 g 200 mL/hr over 30 Minutes Intravenous On call to O.R. 04/30/21 1853 04/30/21 2003   04/30/21 1855  ceFAZolin (ANCEF) 2-4 GM/100ML-% IVPB       Note to Pharmacy: [facility]   : cabinet override      04/30/21 1855 04/30/21 2040   04/30/21 1600  vancomycin (VANCOCIN) IVPB 1000 mg/200 mL premix        1,000 mg 100 mL/hr over 120 Minutes Intravenous Every 12 hours 04/30/21 0450     04/30/21 0345  vancomycin (VANCOCIN) IVPB 1000 mg/200 mL premix        1,000 mg 200 mL/hr over 60 Minutes Intravenous  Once 04/30/21 0330 04/30/21 0547   04/30/21 0330  cefTRIAXone (ROCEPHIN) 2 g in sodium chloride 0.9 % 100 mL IVPB        2 g 200 mL/hr over 30 Minutes Intravenous  Once 04/30/21 0319 04/30/21 0447          Subjective: Able to walk to bathroom with  assistance-- hurts to bend knee  Objective: Vitals:   05/02/21 2134 05/02/21 2258 05/03/21 0517 05/03/21 0800  BP:  117/78 124/74   Pulse:  100 92   Resp:  17 16   Temp:  99 F (37.2 C) 99.1 F (37.3 C)   TempSrc:  Oral Oral   SpO2: 98% 97% 99% 97%  Weight:      Height:        Intake/Output Summary (Last 24 hours) at 05/03/2021 6712 Last data filed at 05/03/2021 0502 Gross per 24 hour  Intake 1840.04 ml  Output 300 ml  Net 1540.04 ml   Filed Weights   04/29/21 2219  Weight: 68 kg    Examination:   General: Appearance:    female in no acute distress     Lungs:     respirations unlabored  Heart:    Normal heart rate. Normal rhythm. No murmurs, rubs, or gallops.    MS:   All extremities are intact.    Neurologic:   Awake, alert, oriented x 3. No apparent focal neurological           defect.        Data Reviewed: I have personally reviewed following labs and imaging studies  CBC: Recent Labs  Lab 04/29/21 2247 05/01/21 0743 05/02/21 0303 05/03/21 0318  WBC 9.1 11.7* 11.5* 8.3  NEUTROABS 6.6 10.4* 8.8*  --   HGB 8.6* 8.9* 7.6* 7.5*  HCT 29.1* 29.8* 25.3* 26.0*  MCV 70.3* 70.6* 70.3* 71.8*  PLT 259 259 251 261   Basic Metabolic Panel: Recent Labs  Lab 04/29/21 2247 05/01/21 0743 05/02/21 0303 05/03/21 0318  NA 136 138 137 137  K 2.9* 3.3* 3.1* 3.6  CL 97* 98 102 98  CO2 27 29 27 31   GLUCOSE 187* 168* 140* 123*  BUN 11 11 13 13   CREATININE 0.66 0.67 0.70 0.64  CALCIUM 8.9 8.3* 8.0* 8.8*  MG  --  1.0* 1.4*  --    GFR: Estimated Creatinine Clearance: 87.5 mL/min (by C-G formula based on SCr of 0.64 mg/dL). Liver Function Tests: Recent Labs  Lab 04/29/21 2247  AST 15  ALT 13  ALKPHOS 63  BILITOT 1.2  PROT 7.3  ALBUMIN 4.1   No results for input(s): LIPASE, AMYLASE in the last 168 hours. No results for input(s): AMMONIA in the last 168 hours. Coagulation Profile: No results for input(s): INR, PROTIME in the last 168 hours. Cardiac Enzymes: Recent Labs  Lab 04/30/21 0057  CKTOTAL 43   BNP (last 3 results) No results for input(s): PROBNP in the last 8760 hours. HbA1C: No results for input(s): HGBA1C in the last 72 hours. CBG: No results for input(s): GLUCAP in the last 168 hours. Lipid Profile: No results for input(s): CHOL, HDL, LDLCALC, TRIG, CHOLHDL, LDLDIRECT in the last 72 hours. Thyroid Function Tests: No results for input(s): TSH, T4TOTAL, FREET4, T3FREE, THYROIDAB in the last 72 hours. Anemia Panel: Recent Labs    05/03/21 0318  FERRITIN 17  TIBC 313  IRON 12*   Sepsis Labs: No results for input(s): PROCALCITON, LATICACIDVEN in the last 168 hours.  Recent Results (from the past 240 hour(s))  Resp Panel by RT-PCR (Flu A&B, Covid) Nasopharyngeal Swab     Status: None   Collection Time: 04/30/21 12:19 AM   Specimen:  Nasopharyngeal Swab; Nasopharyngeal(NP) swabs in vial transport medium  Result Value Ref Range Status   SARS Coronavirus 2 by RT PCR NEGATIVE NEGATIVE Final    Comment: (  NOTE) SARS-CoV-2 target nucleic acids are NOT DETECTED.  The SARS-CoV-2 RNA is generally detectable in upper respiratory specimens during the acute phase of infection. The lowest concentration of SARS-CoV-2 viral copies this assay can detect is 138 copies/mL. A negative result does not preclude SARS-Cov-2 infection and should not be used as the sole basis for treatment or other patient management decisions. A negative result may occur with  improper specimen collection/handling, submission of specimen other than nasopharyngeal swab, presence of viral mutation(s) within the areas targeted by this assay, and inadequate number of viral copies(<138 copies/mL). A negative result must be combined with clinical observations, patient history, and epidemiological information. The expected result is Negative.  Fact Sheet for Patients:  BloggerCourse.comhttps://www.fda.gov/media/152166/download  Fact Sheet for Healthcare Providers:  SeriousBroker.ithttps://www.fda.gov/media/152162/download  This test is no t yet approved or cleared by the [place] FDA and  has been authorized for detection and/or diagnosis of SARS-CoV-2 by FDA under an Emergency Use Authorization (EUA). This EUA will remain  in effect (meaning this test can be used) for the duration of the COVID-19 declaration under Section 564(b)(1) of the Act, 21 U.S.C.section 360bbb-3(b)(1), unless the authorization is terminated  or revoked sooner.       Influenza A by PCR NEGATIVE NEGATIVE Final   Influenza B by PCR NEGATIVE NEGATIVE Final    Comment: (NOTE) The Xpert Xpress SARS-CoV-2/FLU/RSV plus assay is intended as an aid in the diagnosis of influenza from Nasopharyngeal swab specimens and should not be used as a sole basis for treatment. Nasal washings and aspirates are unacceptable for  Xpert Xpress SARS-CoV-2/FLU/RSV testing.  Fact Sheet for Patients: BloggerCourse.comhttps://www.fda.gov/media/152166/download  Fact Sheet for Healthcare Providers: SeriousBroker.ithttps://www.fda.gov/media/152162/download  This test is not yet approved or cleared by the [place] FDA and has been authorized for detection and/or diagnosis of SARS-CoV-2 by FDA under an Emergency Use Authorization (EUA). This EUA will remain in effect (meaning this test can be used) for the duration of the COVID-19 declaration under Section 564(b)(1) of the Act, 21 U.S.C. section 360bbb-3(b)(1), unless the authorization is terminated or revoked.  Performed at [facility], [address] [place], [place] [address]   Body fluid culture w Gram Stain     Status: None (Preliminary result)   Collection Time: 04/30/21  1:10 AM   Specimen: Synovium; Body Fluid  Result Value Ref Range Status   Specimen Description   Final    SYNOVIAL Performed at [facility], [address] [place], [place] [address]    Special Requests   Final    NONE Performed at [facility], [address] [place], [place] [address]    Gram Stain   Final    ABUNDANT WBC PRESENT,BOTH PMN AND MONONUCLEAR RARE GRAM POSITIVE COCCI Gram Stain Report Called to,Read Back By and Verified With: Mickie BailBRIANNA SAVOIE, RN @ [phone] ON 04/30/21 Riesa Pope VARNER Performed at [facility], [address] [place] [address] [place], [place] [address]    Culture   Final    NO GROWTH 2 DAYS Performed at [facility], [address] [place], [place] [address]    Report Status PENDING  Incomplete  Urine Culture     Status: Abnormal   Collection Time: 04/30/21  2:30 AM   Specimen: Urine, Clean Catch  Result Value Ref Range Status   Specimen Description   Final    URINE, CLEAN CATCH Performed at [facility], [address] [place], [place] [address]  Special Requests   Final    NONE Performed at  [facility], [address] [place], [place] [address]    Culture MULTIPLE SPECIES PRESENT, SUGGEST RECOLLECTION (A)  Final   Report Status 05/01/2021 FINAL  Final  Blood culture (routine x 2)     Status: None (Preliminary result)   Collection Time: 04/30/21  3:24 AM   Specimen: BLOOD  Result Value Ref Range Status   Specimen Description   Final    BLOOD LEFT ANTECUBITAL Performed at [facility], [address], [place], [place] [address]    Special Requests   Final    BOTTLES DRAWN AEROBIC AND ANAEROBIC Blood Culture results may not be optimal due to an inadequate volume of blood received in culture bottles Performed at [facility], [address], [place], [place] [address]    Culture   Final    NO GROWTH 3 DAYS Performed at [facility], [address], [place], [place] [address]    Report Status PENDING  Incomplete  Blood culture (routine x 2)     Status: None (Preliminary result)   Collection Time: 04/30/21  3:36 AM   Specimen: BLOOD  Result Value Ref Range Status   Specimen Description   Final    BLOOD RIGHT ANTECUBITAL Performed at [facility], [address], [place], [place] [address]    Special Requests   Final    BOTTLES DRAWN AEROBIC AND ANAEROBIC Blood Culture adequate volume Performed at [facility], [address] [place], [place] [address]    Culture   Final    NO GROWTH 3 DAYS Performed at [facility], [address], [place], [place] [address]    Report Status PENDING  Incomplete  Surgical PCR screen     Status: Abnormal   Collection Time: 04/30/21 10:57 AM   Specimen: Nasal Mucosa; Nasal Swab  Result Value Ref Range Status   MRSA, PCR NEGATIVE NEGATIVE Final   Staphylococcus aureus POSITIVE (A) NEGATIVE Final    Comment: (NOTE) The Xpert SA Assay (FDA approved for NASAL specimens in patients 78 years of age and older), is one component of  a comprehensive surveillance program. It is not intended to diagnose infection nor to guide or monitor treatment. Performed at [facility], [address] [place], [place] [address]   Body fluid culture w Gram Stain     Status: None (Preliminary result)   Collection Time: 04/30/21 10:27 PM   Specimen: Synovial, Right Knee; Body Fluid  Result Value Ref Range Status   Specimen Description SYNOVIAL  Final   Special Requests RIGHT KNEE  Final   Gram Stain   Final    MODERATE WBC PRESENT,BOTH PMN AND MONONUCLEAR NO ORGANISMS SEEN    Culture   Final    NO GROWTH 1 DAY Performed at [facility], [address], [place], [place] [address]    Report Status PENDING  Incomplete  Body fluid culture w Gram Stain     Status: None (Preliminary result)   Collection Time: 04/30/21 10:28 PM   Specimen: Synovial, Left Knee; Body Fluid  Result Value Ref Range Status   Specimen Description Synov, Knee  Final   Special Requests NONE  Final   Gram Stain   Final    MODERATE WBC PRESENT, PREDOMINANTLY PMN NO ORGANISMS SEEN    Culture   Final    RARE STAPHYLOCOCCUS EPIDERMIDIS CULTURE  REINCUBATED FOR BETTER GROWTH Performed at [facility], [address] [place], [place] [address]    Report Status PENDING  Incomplete      Radiology Studies: No results found.  Scheduled Meds:  Chlorhexidine Gluconate Cloth  6 each Topical Daily   mometasone-formoterol  2 puff Inhalation BID   mupirocin ointment  1 application Nasal BID   Continuous Infusions:  cefTRIAXone (ROCEPHIN)  IV 2 g (05/03/21 0404)   vancomycin 1,000 mg (05/03/21 0502)     LOS: 3 days   Time spent: 34 minutes   Joseph Art, DO [facility]  05/03/2021, 9:38 AM   How to contact the [facility] Attending or Consulting provider 7A - 7P or covering provider during after hours 7P -7A, for this patient?  Check the care team in [facility] and look for a) attending/consulting TRH provider listed and b)  the [facility] team listed. Page or secure chat 7A-7P. Log into [email or web address] and use [place]'s universal password to access. If you do not have the password, please contact the hospital operator. Locate the [facility] provider you are looking for under [facility] and page to a number that you can be directly reached. If you still have difficulty reaching the provider, please page the [facility] (Director on Call) for the Hospitalists listed on amion for assistance.

## 2021-05-03 NOTE — Progress Notes (Signed)
Physical Therapy Treatment Patient Details Name: Lori Black MRN: [id number] DOB: Jun 10, 1986 Today's Date: 05/03/2021    History of Present Illness 35 yo female admitted with septic arthritis of bilateral knees. S/P scope/I&D 04/30/21. Hx of asthma.    PT Comments    Progressing with gait tolerance, pain improving. Continue PT POC   Follow Up Recommendations  No PT follow up     Equipment Recommendations  Rolling walker with 5" wheels;3in1 (PT)    Recommendations for Other Services       Precautions / Restrictions Precautions Precautions: Fall Restrictions Weight Bearing Restrictions: No RLE Weight Bearing: Weight bearing as tolerated LLE Weight Bearing: Weight bearing as tolerated    Mobility  Bed Mobility Overal bed mobility: Modified Independent                  Transfers Overall transfer level: Needs assistance Equipment used: Rolling walker (2 wheeled) Transfers: Sit to/from Stand Sit to Stand: From elevated surface;Min assist         General transfer comment: Elevated bed 2* limited knee ROM bilaterally. Cues for safety, technique, hand/LE Placement. Assist to power up. able to control descent to recliner  Ambulation/Gait Ambulation/Gait assistance: Min guard;Supervision Gait Distance (Feet): 125 Feet Assistive device: Rolling walker (2 wheeled) Gait Pattern/deviations: Step-through pattern     General Gait Details: Min guard for safety. Cues for step length, Rw position, improving st shift and decr reliance on UEs   Stairs             Wheelchair Mobility    Modified Rankin (Stroke Patients Only)       Balance           Standing balance support: Bilateral upper extremity supported Standing balance-Leahy Scale: Fair                              Cognition Arousal/Alertness: Awake/alert Behavior During Therapy: WFL for tasks assessed/performed Overall Cognitive Status: Within Functional Limits for tasks  assessed                                        Exercises Total Joint Exercises Ankle Circles/Pumps: AROM;Both;10 reps Quad Sets: AROM;Both;10 reps Heel Slides: AROM;AAROM;Both;5 reps Knee Flexion: AROM;Both;Seated    General Comments        Pertinent Vitals/Pain Pain Assessment: 0-10 Pain Score: 4  Pain Location: bil knees Pain Descriptors / Indicators: Discomfort;Sore;Tightness Pain Intervention(s): Limited activity within patient's tolerance;Monitored during session;Repositioned    Home Living                      Prior Function            PT Goals (current goals can now be found in the care plan section) Acute Rehab PT Goals Patient Stated Goal: regain PLOF PT Goal Formulation: With patient Time For Goal Achievement: 05/15/21 Potential to Achieve Goals: Good Progress towards PT goals: Progressing toward goals    Frequency    Min 5X/week      PT Plan Current plan remains appropriate    Co-evaluation              AM-PAC PT "6 Clicks" Mobility   Outcome Measure  Help needed turning from your back to your side while in a flat bed without using bedrails?: None Help needed moving from lying  on your back to sitting on the side of a flat bed without using bedrails?: None Help needed moving to and from a bed to a chair (including a wheelchair)?: A Little Help needed standing up from a chair using your arms (e.g., wheelchair or bedside chair)?: A Little Help needed to walk in hospital room?: A Little Help needed climbing 3-5 steps with a railing? : A Lot 6 Click Score: 19    End of Session Equipment Utilized During Treatment: Gait belt Activity Tolerance: Patient tolerated treatment well Patient left: in chair;with call bell/phone within reach;with chair alarm set   PT Visit Diagnosis: Other abnormalities of gait and mobility (R26.89);Pain Pain - Right/Left:  (bil) Pain - part of body: Knee     Time: 9935-7017 PT Time  Calculation (min) (ACUTE ONLY): 18 min  Charges:  $Gait Training: 8-22 mins                     Delice Bison, PT  Acute Rehab Dept ([facility]) [phone] Pager [phone]  05/03/2021    [facility] 05/03/2021, 11:43 AM

## 2021-05-03 NOTE — Consult Note (Signed)
[facility]       Reason for Consult:septic arthritis    Referring Physician: Dr. Benjamine Mola  Principal Problem:   Septic arthritis of knee, bilateral (HCC) Active Problems:   Chronic asthma, mild persistent, uncomplicated   Septic arthritis (HCC)    Chlorhexidine Gluconate Cloth  6 each Topical Daily   mometasone-formoterol  2 puff Inhalation BID   mupirocin ointment  1 application Nasal BID    Recommendations: Vancomycin and ceftriaxone pending sensitivities of culture PICC line - ordered OPAT and home health once antibiotic determined  Will need 2 weeks IV antibiotics from date of surgery and will consider continuation with appropriate oral therapy after that Follow up with me on 05/14/21  I will continue to follow while inpatient  Assessment: She has probable septic arthritis, particularly of the left knee with some WBCs and presumably growth of Staph epidermidis from the left knee (other culture marked as right knee so presuming this is from the left knee) and aspiration in ED with GPC (? From left knee).  I recommend treatment and will use appropriate IV antibiotics once sensitivities are known.  She denies risk of sexual contact and now with a positive culture so less likely GC.  She has some cracked teeth, poor dentition and Staph epi can be found in mucous membranes/oral.    Antibiotics: Vancomycin and ceftriaxone  HPI: Lori Black is a 35 y.o. female with a history of asthma and chronic anemia who came to the ED with bilateral knee pain.  She felt subjective fever and chills though has been afebrile here.  Aspiration done in the ED sinificant for 1300 WBCs and GPC on gram stain.  She was seen by orthopedics and taken to the OR for debridement, done on 7/17 by Dr. Carola Frost.  She is feeling well now, asking about discharge.  Operative report reviewed and no significant fluid on the right knee with no frank purulence of the left, though was more turbid  in appearance.     Review of Systems:  Constitutional: negative for fevers and chills Gastrointestinal: negative for nausea and diarrhea Integument/breast: negative for rash All other systems reviewed and are negative    Past Medical History:  Diagnosis Date   Asthma     Social History   Tobacco Use   Smoking status: Never   Smokeless tobacco: Never  Vaping Use   Vaping Use: Never used  Substance Use Topics   Alcohol use: Yes    Comment: occassionally   Drug use: No    Family History  Problem Relation Age of Onset   Diabetes Mother     No Known Allergies  Physical Exam: Constitutional: in no apparent distress  Vitals:   05/03/21 0800 05/03/21 1430  BP:  124/79  Pulse:  (!) 102  Resp:  18  Temp:  98.8 F (37.1 C)  SpO2: 97% 98%   EYES: anicteric Cardiovascular: Cor RRR Respiratory: clear; Musculoskeletal: no pedal edema noted Skin: negatives: no rash Neuro: non-focal  Lab Results  Component Value Date   WBC 8.3 05/03/2021   HGB 7.5 (L) 05/03/2021   HCT 26.0 (L) 05/03/2021   MCV 71.8 (L) 05/03/2021   PLT 261 05/03/2021    Lab Results  Component Value Date   CREATININE 0.64 05/03/2021   BUN 13 05/03/2021   NA 137 05/03/2021   K 3.6 05/03/2021   CL 98 05/03/2021   CO2 31 05/03/2021    Lab Results  Component Value  Date   ALT 13 04/29/2021   AST 15 04/29/2021   ALKPHOS 63 04/29/2021     Microbiology: Recent Results (from the past 240 hour(s))  Resp Panel by RT-PCR (Flu A&B, Covid) Nasopharyngeal Swab     Status: None   Collection Time: 04/30/21 12:19 AM   Specimen: Nasopharyngeal Swab; Nasopharyngeal(NP) swabs in vial transport medium  Result Value Ref Range Status   SARS Coronavirus 2 by RT PCR NEGATIVE NEGATIVE Final    Comment: (NOTE) SARS-CoV-2 target nucleic acids are NOT DETECTED.  The SARS-CoV-2 RNA is generally detectable in upper respiratory specimens during the acute phase of infection. The lowest concentration of SARS-CoV-2  viral copies this assay can detect is 138 copies/mL. A negative result does not preclude SARS-Cov-2 infection and should not be used as the sole basis for treatment or other patient management decisions. A negative result may occur with  improper specimen collection/handling, submission of specimen other than nasopharyngeal swab, presence of viral mutation(s) within the areas targeted by this assay, and inadequate number of viral copies(<138 copies/mL). A negative result must be combined with clinical observations, patient history, and epidemiological information. The expected result is Negative.  Fact Sheet for Patients:  BloggerCourse.com  Fact Sheet for Healthcare Providers:  SeriousBroker.it  This test is no t yet approved or cleared by the [place] FDA and  has been authorized for detection and/or diagnosis of SARS-CoV-2 by FDA under an Emergency Use Authorization (EUA). This EUA will remain  in effect (meaning this test can be used) for the duration of the COVID-19 declaration under Section 564(b)(1) of the Act, 21 U.S.C.section 360bbb-3(b)(1), unless the authorization is terminated  or revoked sooner.       Influenza A by PCR NEGATIVE NEGATIVE Final   Influenza B by PCR NEGATIVE NEGATIVE Final    Comment: (NOTE) The Xpert Xpress SARS-CoV-2/FLU/RSV plus assay is intended as an aid in the diagnosis of influenza from Nasopharyngeal swab specimens and should not be used as a sole basis for treatment. Nasal washings and aspirates are unacceptable for Xpert Xpress SARS-CoV-2/FLU/RSV testing.  Fact Sheet for Patients: BloggerCourse.com  Fact Sheet for Healthcare Providers: SeriousBroker.it  This test is not yet approved or cleared by the [place] FDA and has been authorized for detection and/or diagnosis of SARS-CoV-2 by FDA under an Emergency Use Authorization  (EUA). This EUA will remain in effect (meaning this test can be used) for the duration of the COVID-19 declaration under Section 564(b)(1) of the Act, 21 U.S.C. section 360bbb-3(b)(1), unless the authorization is terminated or revoked.  Performed at [facility], [address] [place], [place] [address]   Body fluid culture w Gram Stain     Status: None   Collection Time: 04/30/21  1:10 AM   Specimen: Synovium; Body Fluid  Result Value Ref Range Status   Specimen Description   Final    SYNOVIAL Performed at [facility], [address] [place], [place] [address]    Special Requests   Final    NONE Performed at [facility], [address] [place], [place] [address]    Gram Stain   Final    ABUNDANT WBC PRESENT,BOTH PMN AND MONONUCLEAR RARE GRAM POSITIVE COCCI Gram Stain Report Called to,Read Back By and Verified With: Mickie Bail, RN @ [phone] ON 04/30/21 Riesa Pope Performed at [facility], [address], [place], [place] [address]    Culture   Final    NO  GROWTH 3 DAYS Performed at [facility], [address], [place], [place] [address]    Report Status 05/03/2021 FINAL  Final  Urine Culture     Status: Abnormal   Collection Time: 04/30/21  2:30 AM   Specimen: Urine, Clean Catch  Result Value Ref Range Status   Specimen Description   Final    URINE, CLEAN CATCH Performed at [facility], [address], [place], [place] [address]    Special Requests   Final    NONE Performed at [facility], [address], [place], [place] [address]    Culture MULTIPLE SPECIES PRESENT, SUGGEST RECOLLECTION (A)  Final   Report Status 05/01/2021 FINAL  Final  Blood culture (routine x 2)     Status: None (Preliminary result)   Collection Time: 04/30/21  3:24 AM   Specimen: BLOOD  Result Value Ref Range Status   Specimen Description   Final    BLOOD LEFT  ANTECUBITAL Performed at [facility], [address], [place], [place] [address]    Special Requests   Final    BOTTLES DRAWN AEROBIC AND ANAEROBIC Blood Culture results may not be optimal due to an inadequate volume of blood received in culture bottles Performed at [facility], [address] [place], [place] [address]    Culture   Final    NO GROWTH 3 DAYS Performed at [facility], [address], [place], [place] [address]    Report Status PENDING  Incomplete  Blood culture (routine x 2)     Status: None (Preliminary result)   Collection Time: 04/30/21  3:36 AM   Specimen: BLOOD  Result Value Ref Range Status   Specimen Description   Final    BLOOD RIGHT ANTECUBITAL Performed at [facility], [address], [place], [place] [address]    Special Requests   Final    BOTTLES DRAWN AEROBIC AND ANAEROBIC Blood Culture adequate volume Performed at [facility], [address] [place], [place] [address]    Culture   Final    NO GROWTH 3 DAYS Performed at [facility], [address] [place], [place] [address]    Report Status PENDING  Incomplete  Surgical PCR screen     Status: Abnormal   Collection Time: 04/30/21 10:57 AM   Specimen: Nasal Mucosa; Nasal Swab  Result Value Ref Range Status   MRSA, PCR NEGATIVE NEGATIVE Final   Staphylococcus aureus POSITIVE (A) NEGATIVE Final    Comment: (NOTE) The Xpert SA Assay (FDA approved for NASAL specimens in patients 63 years of age and older), is one component of a comprehensive surveillance program. It is not intended to diagnose infection nor to guide or monitor treatment. Performed at [facility], [address], [place], [place] [address]   Body fluid culture w Gram Stain     Status: None (Preliminary result)   Collection Time: 04/30/21 10:27 PM   Specimen: Synovial, Right Knee; Body Fluid  Result Value Ref Range  Status   Specimen Description SYNOVIAL  Final   Special Requests RIGHT KNEE  Final   Gram Stain   Final    MODERATE WBC PRESENT,BOTH PMN AND MONONUCLEAR NO ORGANISMS SEEN    Culture   Final    NO GROWTH 2 DAYS Performed at [facility], [address] [place], [place] [address]    Report Status PENDING  Incomplete  Body fluid culture w Gram Stain     Status: None (Preliminary result)   Collection Time: 04/30/21 10:28 PM   Specimen: Synovial, Left Knee; Body Fluid  Result Value Ref Range Status   Specimen Description Synov, Knee  Final   Special Requests NONE  Final   Gram Stain   Final    MODERATE WBC PRESENT, PREDOMINANTLY PMN NO ORGANISMS SEEN    Culture   Final    RARE STAPHYLOCOCCUS EPIDERMIDIS CULTURE REINCUBATED FOR BETTER GROWTH CRITICAL RESULT CALLED TO, READ BACK BY AND VERIFIED WITH: DR Benjamine MolaVANN [phone] AT 1040 BY CM Performed at [facility], [address] [place], [place] [address]    Report Status PENDING  Incomplete    Gardiner Barefootobert W Jacqui Headen, MD [facility] [email or web address] 05/03/2021, 2:51 PM

## 2021-05-03 NOTE — Plan of Care (Signed)
  Problem: Pain Managment: Goal: General experience of comfort will improve Outcome: Progressing   Problem: Safety: Goal: Ability to remain free from injury will improve Outcome: Progressing   

## 2021-05-04 DIAGNOSIS — M009 Pyogenic arthritis, unspecified: Secondary | ICD-10-CM | POA: Diagnosis not present

## 2021-05-04 LAB — VANCOMYCIN, TROUGH: Vancomycin Tr: 10 ug/mL — ABNORMAL LOW (ref 15–20)

## 2021-05-04 LAB — BODY FLUID CULTURE W GRAM STAIN: Culture: NO GROWTH

## 2021-05-04 MED ORDER — MAGNESIUM SULFATE 4 GM/100ML IV SOLN
4.0000 g | Freq: Once | INTRAVENOUS | Status: AC
  Administered 2021-05-04: 4 g via INTRAVENOUS
  Filled 2021-05-04: qty 100

## 2021-05-04 MED ORDER — MAGNESIUM CHLORIDE 64 MG PO TBEC
1.0000 | DELAYED_RELEASE_TABLET | Freq: Two times a day (BID) | ORAL | Status: DC
  Administered 2021-05-05: 64 mg via ORAL
  Filled 2021-05-04: qty 1

## 2021-05-04 NOTE — Progress Notes (Signed)
Physical Therapy Treatment Patient Details Name: Lori Black MRN: [id number] DOB: 1986-07-17 Today's Date: 05/04/2021    History of Present Illness 35 yo female admitted with septic arthritis of bilateral knees. S/P scope/I&D 04/30/21. Hx of asthma.    PT Comments    Pt progressing well with PT.  Incr knee flexion with exercises and functional mobility, pain and edema improving as well.  Follow Up Recommendations  No PT follow up     Equipment Recommendations  Rolling walker with 5" wheels;3in1 (PT)    Recommendations for Other Services       Precautions / Restrictions Precautions Precautions: Fall Restrictions Weight Bearing Restrictions: No    Mobility  Bed Mobility Overal bed mobility: Modified Independent                  Transfers Overall transfer level: Needs assistance Equipment used: Rolling walker (2 wheeled) Transfers: Sit to/from Stand Sit to Stand: From elevated surface;Supervision         General transfer comment: Cues for safety, technique, hand/LE Placement. able to control descent to recliner  Ambulation/Gait Ambulation/Gait assistance: Min guard;Supervision Gait Distance (Feet): 120 Feet Assistive device: Rolling walker (2 wheeled) Gait Pattern/deviations: Step-through pattern     General Gait Details: Min guard for safety. Cues for step length, RW position, improving st shift and decr reliance on UEs.   Stairs             Wheelchair Mobility    Modified Rankin (Stroke Patients Only)       Balance           Standing balance support: Bilateral upper extremity supported Standing balance-Leahy Scale: Fair                              Cognition Arousal/Alertness: Awake/alert Behavior During Therapy: WFL for tasks assessed/performed Overall Cognitive Status: Within Functional Limits for tasks assessed                                        Exercises Total Joint Exercises Ankle  Circles/Pumps: AROM;Both;10 reps Quad Sets: AROM;Both;10 reps Heel Slides: AROM;AAROM;Both;5 reps Hip ABduction/ADduction: AROM;AAROM;Both;10 reps Straight Leg Raises: AAROM;Both;10 reps Knee Flexion: AROM;Both;Seated;5 reps    General Comments        Pertinent Vitals/Pain Pain Assessment: 0-10 Pain Score: 3  Pain Location: bil knees Pain Descriptors / Indicators: Discomfort;Sore;Tightness Pain Intervention(s): Limited activity within patient's tolerance;Monitored during session;Repositioned    Home Living                      Prior Function            PT Goals (current goals can now be found in the care plan section) Acute Rehab PT Goals Patient Stated Goal: regain PLOF PT Goal Formulation: With patient Time For Goal Achievement: 05/15/21 Potential to Achieve Goals: Good Progress towards PT goals: Progressing toward goals    Frequency    Min 5X/week      PT Plan Current plan remains appropriate    Co-evaluation              AM-PAC PT "6 Clicks" Mobility   Outcome Measure  Help needed turning from your back to your side while in a flat bed without using bedrails?: None Help needed moving from lying on your back to sitting on  the side of a flat bed without using bedrails?: None Help needed moving to and from a bed to a chair (including a wheelchair)?: A Little Help needed standing up from a chair using your arms (e.g., wheelchair or bedside chair)?: A Little Help needed to walk in hospital room?: A Little Help needed climbing 3-5 steps with a railing? : A Lot 6 Click Score: 19    End of Session Equipment Utilized During Treatment: Gait belt Activity Tolerance: Patient tolerated treatment well Patient left: in bed;with call bell/phone within reach;with bed alarm set   PT Visit Diagnosis: Other abnormalities of gait and mobility (R26.89);Pain Pain - Right/Left:  (bil) Pain - part of body: Knee     Time: 4650-3546 PT Time Calculation (min)  (ACUTE ONLY): 27 min  Charges:  $Gait Training: 8-22 mins $Therapeutic Exercise: 8-22 mins                     Delice Bison, PT  Acute Rehab Dept ([facility]) [phone] Pager [phone]  05/04/2021    [facility] 05/04/2021, 12:05 PM

## 2021-05-04 NOTE — Plan of Care (Signed)

## 2021-05-04 NOTE — TOC Initial Note (Signed)
Transition of Care [facility]) - Initial/Assessment Note    Patient Details  Name: Lori Black MRN: [id number] Date of Birth: 1985/11/03  [facility] CM/SW Contact:    Lennart Pall, LCSW Phone Number: 05/04/2021, 2:36 PM  Clinical Narrative:                 Met with pt today to introduce self/ TOC role and discuss dc planning needs.  Pt aware MD anticipating need for IV abx at home and this will need [facility] / abx teaching set up/ completed.  She lives with her spouse who can provide assistance.  She confirms she does need rw and 3n1 commode - ordered via [employer] today for delivery to hospital room.  Currently awaiting final decision on abx plan.  Expected Discharge Plan: [place] Barriers to Discharge: Continued Medical Work up   Patient Goals and CMS Choice Patient states their goals for this hospitalization and ongoing recovery are:: go home      Expected Discharge Plan and Services Expected Discharge Plan: [place]       Living arrangements for the past 2 months: Single Family Home                 DME Arranged: 3-N-1, Walker rolling DME Agency: [employer] Date DME Agency Contacted: 05/04/21 Time DME Agency Contacted: 50 Representative spoke with at DME Agency: Pura Spice            Prior Living Arrangements/Services Living arrangements for the past 2 months: [place] with:: Spouse Patient language and need for interpreter reviewed:: Yes Do you feel safe going back to the place where you live?: Yes      Need for Family Participation in Patient Care: Yes (Comment) Care giver support system in place?: Yes (comment)   Criminal Activity/Legal Involvement Pertinent to Current Situation/Hospitalization: No - Comment as needed  Activities of Daily Living Home Assistive Devices/Equipment: Nebulizer ADL Screening (condition at time of admission) Patient's cognitive ability adequate to safely complete daily  activities?: Yes Is the patient deaf or have difficulty hearing?: No Does the patient have difficulty seeing, even when wearing glasses/contacts?: No Does the patient have difficulty concentrating, remembering, or making decisions?: No Patient able to express need for assistance with ADLs?: Yes Does the patient have difficulty dressing or bathing?: No Independently performs ADLs?: Yes (appropriate for developmental age) Does the patient have difficulty walking or climbing stairs?: Yes (secondary to leg pain) Weakness of Legs: Both Weakness of Arms/Hands: None  Permission Sought/Granted   Permission granted to share information with : Yes, Verbal Permission Granted  Share Information with NAME: Olivette Beckmann     Permission granted to share info w Relationship: spouse  Permission granted to share info w Contact Information: [phone]  Emotional Assessment Appearance:: Appears stated age Attitude/Demeanor/Rapport: Gracious, Engaged Affect (typically observed): Accepting Orientation: : Oriented to Self, Oriented to Place, Oriented to  Time, Oriented to Situation Alcohol / Substance Use: Not Applicable Psych Involvement: No (comment)  Admission diagnosis:  Pyogenic arthritis of left knee joint, due to unspecified organism [facility]) [M00.9] Septic arthritis of knee, bilateral (HCC) [M00.9] Septic arthritis ([place]) [M00.9] Patient Active Problem List   Diagnosis Date Noted   Septic arthritis of knee, bilateral ([place]) 04/30/2021   Septic arthritis ([place]) 04/30/2021   Rhinitis, chronic 07/31/2018   Chest pain, musculoskeletal 07/19/2017   Chronic asthma, mild persistent, uncomplicated 13/24/4010   Asthma with acute exacerbation 10/11/2016   Microcytic  anemia 10/11/2016   Influenza A 10/11/2016   Acute respiratory failure ([place]) 10/09/2016   PCP:  Tanda Rockers, MD Pharmacy:   [facility] [address], [place] [address] [place] [address] [place]  [address] Phone: [phone] Fax: [phone]     Social Determinants of Health (SDOH) Interventions    Readmission Risk Interventions No flowsheet data found.

## 2021-05-04 NOTE — Progress Notes (Signed)
[facility]   Reason for visit: Follow up on septic arthritis  Interval History: sensitivities pending and micro reports they will not be reported until tomorrow. Remains afebrile. Family at bedside. No new complaints.  No associated rash or diarrhea.     Physical Exam: Constitutional:  Vitals:   05/04/21 0806 05/04/21 1342  BP:  123/84  Pulse:  (!) 116  Resp:  18  Temp:  98 F (36.7 C)  SpO2: 99% 99%   patient appears in NAD Respiratory: Normal respiratory effort; CTA B Cardiovascular: RRR MS: knee wrapped  Review of Systems: Constitutional: negative for fevers and chills Gastrointestinal: negative for diarrhea Integument/breast: negative for rash  Lab Results  Component Value Date   WBC 8.3 05/03/2021   HGB 7.5 (L) 05/03/2021   HCT 26.0 (L) 05/03/2021   MCV 71.8 (L) 05/03/2021   PLT 261 05/03/2021    Lab Results  Component Value Date   CREATININE 0.64 05/03/2021   BUN 13 05/03/2021   NA 137 05/03/2021   K 3.6 05/03/2021   CL 98 05/03/2021   CO2 31 05/03/2021    Lab Results  Component Value Date   ALT 13 04/29/2021   AST 15 04/29/2021   ALKPHOS 63 04/29/2021     Microbiology: Recent Results (from the past 240 hour(s))  Resp Panel by RT-PCR (Flu A&B, Covid) Nasopharyngeal Swab     Status: None   Collection Time: 04/30/21 12:19 AM   Specimen: Nasopharyngeal Swab; Nasopharyngeal(NP) swabs in vial transport medium  Result Value Ref Range Status   SARS Coronavirus 2 by RT PCR NEGATIVE NEGATIVE Final    Comment: (NOTE) SARS-CoV-2 target nucleic acids are NOT DETECTED.  The SARS-CoV-2 RNA is generally detectable in upper respiratory specimens during the acute phase of infection. The lowest concentration of SARS-CoV-2 viral copies this assay can detect is 138 copies/mL. A negative result does not preclude SARS-Cov-2 infection and should not be used as the sole basis for treatment or other patient management decisions. A  negative result may occur with  improper specimen collection/handling, submission of specimen other than nasopharyngeal swab, presence of viral mutation(s) within the areas targeted by this assay, and inadequate number of viral copies(<138 copies/mL). A negative result must be combined with clinical observations, patient history, and epidemiological information. The expected result is Negative.  Fact Sheet for Patients:  BloggerCourse.com  Fact Sheet for Healthcare Providers:  SeriousBroker.it  This test is no t yet approved or cleared by the [place] FDA and  has been authorized for detection and/or diagnosis of SARS-CoV-2 by FDA under an Emergency Use Authorization (EUA). This EUA will remain  in effect (meaning this test can be used) for the duration of the COVID-19 declaration under Section 564(b)(1) of the Act, 21 U.S.C.section 360bbb-3(b)(1), unless the authorization is terminated  or revoked sooner.       Influenza A by PCR NEGATIVE NEGATIVE Final   Influenza B by PCR NEGATIVE NEGATIVE Final    Comment: (NOTE) The Xpert Xpress SARS-CoV-2/FLU/RSV plus assay is intended as an aid in the diagnosis of influenza from Nasopharyngeal swab specimens and should not be used as a sole basis for treatment. Nasal washings and aspirates are unacceptable for Xpert Xpress SARS-CoV-2/FLU/RSV testing.  Fact Sheet for Patients: BloggerCourse.com  Fact Sheet for Healthcare Providers: SeriousBroker.it  This test is not yet approved or cleared by the [place] FDA and has been authorized for detection and/or diagnosis of SARS-CoV-2 by FDA under an  Emergency Use Authorization (EUA). This EUA will remain in effect (meaning this test can be used) for the duration of the COVID-19 declaration under Section 564(b)(1) of the Act, 21 U.S.C. section 360bbb-3(b)(1), unless the authorization  is terminated or revoked.  Performed at [facility], [address] [place], [place] [address]   Body fluid culture w Gram Stain     Status: None   Collection Time: 04/30/21  1:10 AM   Specimen: Synovium; Body Fluid  Result Value Ref Range Status   Specimen Description   Final    SYNOVIAL Performed at [facility], [address], [place], [place] [address]    Special Requests   Final    NONE Performed at [facility], [address], [place], [place] [address]    Gram Stain   Final    ABUNDANT WBC PRESENT,BOTH PMN AND MONONUCLEAR RARE GRAM POSITIVE COCCI Gram Stain Report Called to,Read Back By and Verified With: Mickie Bail, RN @ [phone] ON 04/30/21 Riesa Pope Performed at [facility], [address] [place], [place] [address]    Culture   Final    NO GROWTH 3 DAYS Performed at [facility], [address] [place], [place] [address]    Report Status 05/03/2021 FINAL  Final  Urine Culture     Status: Abnormal   Collection Time: 04/30/21  2:30 AM   Specimen: Urine, Clean Catch  Result Value Ref Range Status   Specimen Description   Final    URINE, CLEAN CATCH Performed at [facility], [address], [place], [place] [address]    Special Requests   Final    NONE Performed at [facility], [address], [place], [place] [address]    Culture MULTIPLE SPECIES PRESENT, SUGGEST RECOLLECTION (A)  Final   Report Status 05/01/2021 FINAL  Final  Blood culture (routine x 2)     Status: None (Preliminary result)   Collection Time: 04/30/21  3:24 AM   Specimen: BLOOD  Result Value Ref Range Status   Specimen Description   Final    BLOOD LEFT ANTECUBITAL Performed at [facility], [address], [place], [place] [address]    Special Requests   Final    BOTTLES DRAWN AEROBIC AND ANAEROBIC Blood Culture results may not be optimal due to  an inadequate volume of blood received in culture bottles Performed at [facility], [address] [place], [place] [address]    Culture   Final    NO GROWTH 4 DAYS Performed at [facility], [address], [place], [place] [address]    Report Status PENDING  Incomplete  Blood culture (routine x 2)     Status: None (Preliminary result)   Collection Time: 04/30/21  3:36 AM   Specimen: BLOOD  Result Value Ref Range Status   Specimen Description   Final    BLOOD RIGHT ANTECUBITAL Performed at [facility], [address], [place], [place] [address]    Special Requests   Final    BOTTLES DRAWN AEROBIC AND ANAEROBIC Blood Culture adequate volume Performed at [facility], [address] [place], [place] [address]    Culture   Final    NO GROWTH 4 DAYS Performed at [facility], [address], [place], [place] [address]    Report Status PENDING  Incomplete  Surgical PCR screen  Status: Abnormal   Collection Time: 04/30/21 10:57 AM   Specimen: Nasal Mucosa; Nasal Swab  Result Value Ref Range Status   MRSA, PCR NEGATIVE NEGATIVE Final   Staphylococcus aureus POSITIVE (A) NEGATIVE Final    Comment: (NOTE) The Xpert SA Assay (FDA approved for NASAL specimens in patients 8 years of age and older), is one component of a comprehensive surveillance program. It is not intended to diagnose infection nor to guide or monitor treatment. Performed at [facility], [address], [place], [place] [address]   Body fluid culture w Gram Stain     Status: None   Collection Time: 04/30/21 10:27 PM   Specimen: Synovial, Right Knee; Body Fluid  Result Value Ref Range Status   Specimen Description SYNOVIAL  Final   Special Requests RIGHT KNEE  Final   Gram Stain   Final    MODERATE WBC PRESENT,BOTH PMN AND MONONUCLEAR NO ORGANISMS SEEN    Culture   Final    NO GROWTH 3 DAYS Performed at [facility], [address], [place], [place] [address]    Report Status 05/04/2021 FINAL  Final  Body fluid culture w Gram Stain     Status: None (Preliminary result)   Collection Time: 04/30/21 10:28 PM   Specimen: Synovial, Left Knee; Body Fluid  Result Value Ref Range Status   Specimen Description Synov, Knee  Final   Special Requests NONE  Final   Gram Stain   Final    MODERATE WBC PRESENT, PREDOMINANTLY PMN NO ORGANISMS SEEN    Culture   Final    RARE STAPHYLOCOCCUS EPIDERMIDIS CULTURE REINCUBATED FOR BETTER GROWTH CRITICAL RESULT CALLED TO, READ BACK BY AND VERIFIED WITH: DR Benjamine Mola [id number] AT 1040 BY CM Performed at [facility], [address], [place], [place] [address]    Report Status PENDING  Incomplete    Impression/Plan:  1. Septic arthritis - on vancomycin and ceftriaxone pending sensitivities which will result tomorrow and can narrow to vancomycin or cefazolin at that time. If she prefers to go home today, can use vancomycin at discharge and we can monitor cultures tomorrow and make any indicated adjustments.  Otherwise we will complete OPAT tomorrow. She has follow up with me on 05/14/21.    2.  Access - has picc line now  3.  Medication monitoring - labs per home health.    OPAT tomorrow

## 2021-05-04 NOTE — Progress Notes (Signed)
Pharmacy Antibiotic Note  Lori Black is a 35 y.o. female admitted on 04/29/2021 with Bilateral atraumatic knee pain with fever at home.  No history of rheumatological issues Pharmacy has been consulted to dose vancomycin for septic joint.  ID consulted and recommends 2 weeks IV antibiotics from date of surgery, and can consider transition to oral antibiotics afterwards.    WBC 8.3, afebrile, Scr <1- stable  Plan: Continue vancomycin 1000mg  IV q12hours F/u synovial fluid culture sensitivities   Height: 5\' 5"  (165.1 cm) Weight: 68 kg (150 lb) IBW/kg (Calculated) : 57  Temp (24hrs), Avg:98.3 F (36.8 C), Min:98 F (36.7 C), Max:98.8 F (37.1 C)  Recent Labs  Lab 04/29/21 2247 05/01/21 0743 05/02/21 0303 05/03/21 0318 05/03/21 1850 05/04/21 0330  WBC 9.1 11.7* 11.5* 8.3  --   --   CREATININE 0.66 0.67 0.70 0.64  --   --   VANCOTROUGH  --   --   --   --   --  10*  VANCOPEAK  --   --   --   --  40  --      Estimated Creatinine Clearance: 87.5 mL/min (by C-G formula based on SCr of 0.64 mg/dL).    No Known Allergies  Antimicrobials this admission: Vanc 7/15 >> CTX 7/15 x 1  Dose adjustments this admission: -7/18 VP: 40 (drawn early ~30 min after infusion end), 7/19 VT: 10 >> AUC 538.8  Microbiology results: 7/15 BCx: ngtd 7/15 UCx: multiple species 7/15 Synovial fluid: Rare staph epi   Thank you for allowing pharmacy to be a part of this patient's care.  8/15, PharmD 05/04/2021 7:41 AM

## 2021-05-04 NOTE — Progress Notes (Signed)
PROGRESS NOTE    Lori Black  FIE:[id number] DOB: 1986/03/12 DOA: 04/29/2021 PCP: Nyoka Cowden, MD   Brief Narrative:  Lori Black is a 35 y.o. female with history of asthma and chronic anemia presented to the ER with complaints of bilateral knee pain, symptom onset duration of only 1 to 2 days.  No other complaint.  She was admitted under hospitalist service at [facility] long with a diagnosis of bilateral septic knee arthritis but then was transported to [facility] and underwent incision and drainage of bilateral knees by Dr. Carola Frost and now back to [facility]. Await culture results so abx can be determined.    Assessment & Plan:   Principal Problem:   Septic arthritis of knee, bilateral (HCC) Active Problems:   Chronic asthma, mild persistent, uncomplicated   Septic arthritis (HCC)   Septic arthritis of bilateral knee: gram + organism seen on gram stain S/p I&D bilateral knee by Dr. Carola Frost on 04/30/2021.   -back to the OR on 7/16 for aspiration -ceftriaxone and vancomycin - await culture -unclear source, may need dental consult as no way to get orthopantogram at [facility]  -CT scan shows:  Poor dentition with multiple carious teeth and multifocal periapical lucencies, as described. Mild-to-moderate mucosal thickening within the inferior maxillary sinuses, bilaterally. Superimposed 13 mm left maxillary sinus mucous retention cyst. Small mucous retention cysts within a right ethmoid air cell and within a pneumatized left middle nasal turbinate. -GC negative -ID consult appreciated:  Vancomycin and ceftriaxone pending sensitivities of culture PICC line - ordered OPAT and home health once antibiotic determined Will need 2 weeks IV antibiotics from date of surgery and will consider continuation with appropriate oral therapy after that Follow up with me on 05/14/21  Chronic microcytic/possible iron deficiency anemia:  -transfuse for <7 -replace iron  when infection  controlled  Hypokalemia:  -replete  Hypomagnesemia:  -replete  Mild intermittent asthma: Controlled.  She is not hypoxic.  Continue home medications.   DVT prophylaxis: Place and maintain sequential compression device Start: 05/01/21 0022 SCDs Start: 04/30/21 0853   Code Status: Full Code    Status is: Inpatient  Remains inpatient appropriate because:Inpatient level of care appropriate due to severity of illness  Dispo: The patient is from: Home              Anticipated d/c is to: Home              Patient currently is not medically stable to d/c.- await sensitivities so abx can be de-escalated   Difficult to place patient No   Consultants:  Orthopedics ID  Procedures:  I&D bilateral knees  Antimicrobials:  Anti-infectives (From admission, onward)    Start     Dose/Rate Route Frequency Ordered Stop   05/01/21 0400  cefTRIAXone (ROCEPHIN) 2 g in sodium chloride 0.9 % 100 mL IVPB        2 g 200 mL/hr over 30 Minutes Intravenous Every 24 hours 04/30/21 0858     04/30/21 2015  ceFAZolin (ANCEF) IVPB 2g/100 mL premix        2 g 200 mL/hr over 30 Minutes Intravenous On call to O.R. 04/30/21 1853 04/30/21 2003   04/30/21 1855  ceFAZolin (ANCEF) 2-4 GM/100ML-% IVPB       Note to Pharmacy: [facility]   : cabinet override      04/30/21 1855 04/30/21 2040   04/30/21 1600  vancomycin (VANCOCIN) IVPB 1000 mg/200 mL premix        1,000  mg 100 mL/hr over 120 Minutes Intravenous Every 12 hours 04/30/21 0450     04/30/21 0345  vancomycin (VANCOCIN) IVPB 1000 mg/200 mL premix        1,000 mg 200 mL/hr over 60 Minutes Intravenous  Once 04/30/21 0330 04/30/21 0547   04/30/21 0330  cefTRIAXone (ROCEPHIN) 2 g in sodium chloride 0.9 % 100 mL IVPB        2 g 200 mL/hr over 30 Minutes Intravenous  Once 04/30/21 0319 04/30/21 0447          Subjective: Asking about going home  Objective: Vitals:   05/03/21 1948 05/03/21 2203 05/04/21 0558 05/04/21 0806  BP:  120/80 121/87    Pulse:  100 83   Resp:  16 17   Temp:  98.2 F (36.8 C) 98 F (36.7 C)   TempSrc:  Oral Oral   SpO2: 98% 99% 95% 99%  Weight:      Height:        Intake/Output Summary (Last 24 hours) at 05/04/2021 1321 Last data filed at 05/04/2021 0538 Gross per 24 hour  Intake 340 ml  Output --  Net 340 ml   Filed Weights   04/29/21 2219  Weight: 68 kg    Examination:   General: Appearance:    Well developed, well nourished female in no acute distress     Lungs:     respirations unlabored  Heart:    Normal heart rate.     MS:   All extremities are intact.    Neurologic:   Awake, alert, oriented x 3.         Data Reviewed: I have personally reviewed following labs and imaging studies  CBC: Recent Labs  Lab 04/29/21 2247 05/01/21 0743 05/02/21 0303 05/03/21 0318  WBC 9.1 11.7* 11.5* 8.3  NEUTROABS 6.6 10.4* 8.8*  --   HGB 8.6* 8.9* 7.6* 7.5*  HCT 29.1* 29.8* 25.3* 26.0*  MCV 70.3* 70.6* 70.3* 71.8*  PLT 259 259 251 261   Basic Metabolic Panel: Recent Labs  Lab 04/29/21 2247 05/01/21 0743 05/02/21 0303 05/03/21 0318  NA 136 138 137 137  K 2.9* 3.3* 3.1* 3.6  CL 97* 98 102 98  CO2 27 29 27 31   GLUCOSE 187* 168* 140* 123*  BUN 11 11 13 13   CREATININE 0.66 0.67 0.70 0.64  CALCIUM 8.9 8.3* 8.0* 8.8*  MG  --  1.0* 1.4* 1.2*   GFR: Estimated Creatinine Clearance: 87.5 mL/min (by C-G formula based on SCr of 0.64 mg/dL). Liver Function Tests: Recent Labs  Lab 04/29/21 2247  AST 15  ALT 13  ALKPHOS 63  BILITOT 1.2  PROT 7.3  ALBUMIN 4.1   No results for input(s): LIPASE, AMYLASE in the last 168 hours. No results for input(s): AMMONIA in the last 168 hours. Coagulation Profile: No results for input(s): INR, PROTIME in the last 168 hours. Cardiac Enzymes: Recent Labs  Lab 04/30/21 0057  CKTOTAL 43   BNP (last 3 results) No results for input(s): PROBNP in the last 8760 hours. HbA1C: No results for input(s): HGBA1C in the last 72 hours. CBG: No  results for input(s): GLUCAP in the last 168 hours. Lipid Profile: No results for input(s): CHOL, HDL, LDLCALC, TRIG, CHOLHDL, LDLDIRECT in the last 72 hours. Thyroid Function Tests: No results for input(s): TSH, T4TOTAL, FREET4, T3FREE, THYROIDAB in the last 72 hours. Anemia Panel: Recent Labs    05/03/21 0318  FERRITIN 17  TIBC 313  IRON 12*  Sepsis Labs: No results for input(s): PROCALCITON, LATICACIDVEN in the last 168 hours.  Recent Results (from the past 240 hour(s))  Resp Panel by RT-PCR (Flu A&B, Covid) Nasopharyngeal Swab     Status: None   Collection Time: 04/30/21 12:19 AM   Specimen: Nasopharyngeal Swab; Nasopharyngeal(NP) swabs in vial transport medium  Result Value Ref Range Status   SARS Coronavirus 2 by RT PCR NEGATIVE NEGATIVE Final    Comment: (NOTE) SARS-CoV-2 target nucleic acids are NOT DETECTED.  The SARS-CoV-2 RNA is generally detectable in upper respiratory specimens during the acute phase of infection. The lowest concentration of SARS-CoV-2 viral copies this assay can detect is 138 copies/mL. A negative result does not preclude SARS-Cov-2 infection and should not be used as the sole basis for treatment or other patient management decisions. A negative result may occur with  improper specimen collection/handling, submission of specimen other than nasopharyngeal swab, presence of viral mutation(s) within the areas targeted by this assay, and inadequate number of viral copies(<138 copies/mL). A negative result must be combined with clinical observations, patient history, and epidemiological information. The expected result is Negative.  Fact Sheet for Patients:  BloggerCourse.com  Fact Sheet for Healthcare Providers:  SeriousBroker.it  This test is no t yet approved or cleared by the [place] FDA and  has been authorized for detection and/or diagnosis of SARS-CoV-2 by FDA under an Emergency Use  Authorization (EUA). This EUA will remain  in effect (meaning this test can be used) for the duration of the COVID-19 declaration under Section 564(b)(1) of the Act, 21 U.S.C.section 360bbb-3(b)(1), unless the authorization is terminated  or revoked sooner.       Influenza A by PCR NEGATIVE NEGATIVE Final   Influenza B by PCR NEGATIVE NEGATIVE Final    Comment: (NOTE) The Xpert Xpress SARS-CoV-2/FLU/RSV plus assay is intended as an aid in the diagnosis of influenza from Nasopharyngeal swab specimens and should not be used as a sole basis for treatment. Nasal washings and aspirates are unacceptable for Xpert Xpress SARS-CoV-2/FLU/RSV testing.  Fact Sheet for Patients: BloggerCourse.com  Fact Sheet for Healthcare Providers: SeriousBroker.it  This test is not yet approved or cleared by the [place] FDA and has been authorized for detection and/or diagnosis of SARS-CoV-2 by FDA under an Emergency Use Authorization (EUA). This EUA will remain in effect (meaning this test can be used) for the duration of the COVID-19 declaration under Section 564(b)(1) of the Act, 21 U.S.C. section 360bbb-3(b)(1), unless the authorization is terminated or revoked.  Performed at [facility], [address], [place], [place] [address]   Body fluid culture w Gram Stain     Status: None   Collection Time: 04/30/21  1:10 AM   Specimen: Synovium; Body Fluid  Result Value Ref Range Status   Specimen Description   Final    SYNOVIAL Performed at [facility], [address] [place], [place] [address]    Special Requests   Final    NONE Performed at [facility], [address] [place], [place] [address]    Gram Stain   Final    ABUNDANT WBC PRESENT,BOTH PMN AND MONONUCLEAR RARE GRAM POSITIVE COCCI Gram Stain Report Called to,Read Back By and Verified With: Mickie Bail, RN @ [phone] ON  04/30/21 Riesa Pope Performed at [facility], [address], [place], [place] [address]    Culture   Final    NO GROWTH 3 DAYS Performed at [facility], [address], [place], [place] [address]    Report Status 05/03/2021 FINAL  Final  Urine Culture     Status: Abnormal   Collection Time: 04/30/21  2:30 AM   Specimen: Urine, Clean Catch  Result Value Ref Range Status   Specimen Description   Final    URINE, CLEAN CATCH Performed at [facility], [address], [place], [place] [address]    Special Requests   Final    NONE Performed at [facility], [address], [place], [place] [address]    Culture MULTIPLE SPECIES PRESENT, SUGGEST RECOLLECTION (A)  Final   Report Status 05/01/2021 FINAL  Final  Blood culture (routine x 2)     Status: None (Preliminary result)   Collection Time: 04/30/21  3:24 AM   Specimen: BLOOD  Result Value Ref Range Status   Specimen Description   Final    BLOOD LEFT ANTECUBITAL Performed at [facility], [address], [place], [place] [address]    Special Requests   Final    BOTTLES DRAWN AEROBIC AND ANAEROBIC Blood Culture results may not be optimal due to an inadequate volume of blood received in culture bottles Performed at [facility], [address], [place], [place] [address]    Culture   Final    NO GROWTH 4 DAYS Performed at [facility], [address] [place], [place] [address]    Report Status PENDING  Incomplete  Blood culture (routine x 2)     Status: None (Preliminary result)   Collection Time: 04/30/21  3:36 AM   Specimen: BLOOD  Result Value Ref Range Status   Specimen Description   Final    BLOOD RIGHT ANTECUBITAL Performed at [facility], [address], [place], [place] [address]    Special Requests   Final    BOTTLES DRAWN AEROBIC AND ANAEROBIC Blood Culture adequate volume Performed at  [facility], [address], [place], [place] [address]    Culture   Final    NO GROWTH 4 DAYS Performed at [facility], [address], [place], [place] [address]    Report Status PENDING  Incomplete  Surgical PCR screen     Status: Abnormal   Collection Time: 04/30/21 10:57 AM   Specimen: Nasal Mucosa; Nasal Swab  Result Value Ref Range Status   MRSA, PCR NEGATIVE NEGATIVE Final   Staphylococcus aureus POSITIVE (A) NEGATIVE Final    Comment: (NOTE) The Xpert SA Assay (FDA approved for NASAL specimens in patients 60 years of age and older), is one component of a comprehensive surveillance program. It is not intended to diagnose infection nor to guide or monitor treatment. Performed at [facility], [address], [place], [place] [address]   Body fluid culture w Gram Stain     Status: None   Collection Time: 04/30/21 10:27 PM   Specimen: Synovial, Right Knee; Body Fluid  Result Value Ref Range Status   Specimen Description SYNOVIAL  Final   Special Requests RIGHT KNEE  Final   Gram Stain   Final    MODERATE WBC PRESENT,BOTH PMN AND MONONUCLEAR NO ORGANISMS SEEN    Culture   Final    NO GROWTH 3 DAYS Performed at [facility], [address] [place], [place] [address]    Report Status 05/04/2021 FINAL  Final  Body fluid culture w Gram Stain  Status: None (Preliminary result)   Collection Time: 04/30/21 10:28 PM   Specimen: Synovial, Left Knee; Body Fluid  Result Value Ref Range Status   Specimen Description Synov, Knee  Final   Special Requests NONE  Final   Gram Stain   Final    MODERATE WBC PRESENT, PREDOMINANTLY PMN NO ORGANISMS SEEN    Culture   Final    RARE STAPHYLOCOCCUS EPIDERMIDIS CULTURE REINCUBATED FOR BETTER GROWTH CRITICAL RESULT CALLED TO, READ BACK BY AND VERIFIED WITH: DR Benjamine Mola [id number] AT 1040 BY CM Performed at [facility], [address] [place], [place] [address]    Report Status  PENDING  Incomplete      Radiology Studies: CT MAXILLOFACIAL W CONTRAST  Result Date: 05/03/2021 CLINICAL DATA:  Provided history: Imbedded or impacted teeth; has septic joint in knee so question source dental carie/abscess. EXAM: CT MAXILLOFACIAL WITH CONTRAST TECHNIQUE: Multidetector CT imaging of the maxillofacial structures was performed with intravenous contrast. Multiplanar CT image reconstructions were also generated. CONTRAST:  75mL OMNIPAQUE IOHEXOL 350 MG/ML SOLN COMPARISON:  CT of the paranasal sinuses 08/07/2018. FINDINGS: Osseous: Poor dentition with multiple carious teeth and multifocal periapical lucencies. Periapical lucency surrounds a carious and fractured left maxillary first molar (series 8, image 53). Subtle periapical lucency surrounds the left maxillary second molar (series 8, image 54). Subtle periapical lucency also surrounds the right maxillary first and second molars (series 8, images 28 and 26). Additionally, periapical lucency is question surrounding the right mandibular third molar, which is carious (series 8, image 25). No maxillofacial fracture is defied Orbits: No inflammatory changes or acute finding within the orbits. Sinuses: Mild to moderate mucosal thickening within the inferior maxillary sinuses bilaterally. Superimposed 13 mm left maxillary sinus mucous retention cyst. Additionally, there is a subcentimeter nonspecific calcified focus within the inferior right maxillary sinus. Small mucous retention cysts within the right ethmoid air cells and within a pneumatized left middle nasal turbinate. Soft tissues: No appreciable inflammatory changes within the visualized maxillofacial or upper neck soft tissues. Limited intracranial: No evidence of acute intracranial mallet within the field of view. IMPRESSION: Poor dentition with multiple carious teeth and multifocal periapical lucencies, as described. Mild-to-moderate mucosal thickening within the inferior maxillary sinuses,  bilaterally. Superimposed 13 mm left maxillary sinus mucous retention cyst. Small mucous retention cysts within a right ethmoid air cell and within a pneumatized left middle nasal turbinate. Electronically Signed   By: Jackey Loge DO   On: 05/03/2021 12:55   [place] EKG SITE RITE  Result Date: 05/03/2021 If Site Rite image not attached, placement could not be confirmed due to current cardiac rhythm.   Scheduled Meds:  Chlorhexidine Gluconate Cloth  6 each Topical Daily   [START ON 05/05/2021] magnesium chloride  1 tablet Oral BID   mometasone-formoterol  2 puff Inhalation BID   mupirocin ointment  1 application Nasal BID   Continuous Infusions:  cefTRIAXone (ROCEPHIN)  IV 2 g (05/04/21 0409)   vancomycin 1,000 mg (05/04/21 0538)     LOS: 4 days   Time spent: 34 minutes   Joseph Art, DO [facility]  05/04/2021, 1:21 PM   How to contact the [facility] Attending or Consulting provider 7A - 7P or covering provider during after hours 7P -7A, for this patient?  Check the care team in [facility] and look for a) attending/consulting TRH provider listed and b) the [facility] team listed. Page or secure chat 7A-7P. Log into [email or web address] and use [facility] universal password to  access. If you do not have the password, please contact the hospital operator. Locate the [facility] provider you are looking for under [facility] and page to a number that you can be directly reached. If you still have difficulty reaching the provider, please page the [facility] (Director on Call) for the Hospitalists listed on amion for assistance.

## 2021-05-05 DIAGNOSIS — E876 Hypokalemia: Secondary | ICD-10-CM | POA: Clinically undetermined

## 2021-05-05 DIAGNOSIS — J4530 Mild persistent asthma, uncomplicated: Secondary | ICD-10-CM

## 2021-05-05 LAB — BASIC METABOLIC PANEL
Anion gap: 9 (ref 5–15)
BUN: 15 mg/dL (ref 6–20)
CO2: 31 mmol/L (ref 22–32)
Calcium: 8.8 mg/dL — ABNORMAL LOW (ref 8.9–10.3)
Chloride: 98 mmol/L (ref 98–111)
Creatinine, Ser: 0.63 mg/dL (ref 0.44–1.00)
GFR, Estimated: >60 mL/min (ref >60)
Glucose, Bld: 115 mg/dL — ABNORMAL HIGH (ref 70–99)
Potassium: 3.4 mmol/L — ABNORMAL LOW (ref 3.5–5.1)
Sodium: 138 mmol/L (ref 135–145)

## 2021-05-05 LAB — BODY FLUID CULTURE W GRAM STAIN

## 2021-05-05 LAB — CBC WITH DIFFERENTIAL/PLATELET
Abs Immature Granulocytes: 0.03 10*3/uL (ref 0.00–0.07)
Basophils Absolute: 0.1 10*3/uL (ref 0.0–0.1)
Basophils Relative: 1 %
Eosinophils Absolute: 0.2 10*3/uL (ref 0.0–0.5)
Eosinophils Relative: 2 %
HCT: 25.3 % — ABNORMAL LOW (ref 36.0–46.0)
Hemoglobin: 7.4 g/dL — ABNORMAL LOW (ref 12.0–15.0)
Immature Granulocytes: 0 %
Lymphocytes Relative: 24 %
Lymphs Abs: 1.9 10*3/uL (ref 0.7–4.0)
MCH: 20.8 pg — ABNORMAL LOW (ref 26.0–34.0)
MCHC: 29.2 g/dL — ABNORMAL LOW (ref 30.0–36.0)
MCV: 71.3 fL — ABNORMAL LOW (ref 80.0–100.0)
Monocytes Absolute: 0.5 10*3/uL (ref 0.1–1.0)
Monocytes Relative: 7 %
Neutro Abs: 5.1 10*3/uL (ref 1.7–7.7)
Neutrophils Relative %: 66 %
Platelets: 247 10*3/uL (ref 150–400)
RBC: 3.55 MIL/uL — ABNORMAL LOW (ref 3.87–5.11)
RDW: 16.5 % — ABNORMAL HIGH (ref 11.5–15.5)
WBC: 7.9 10*3/uL (ref 4.0–10.5)
nRBC: 0 % (ref 0.0–0.2)

## 2021-05-05 LAB — MAGNESIUM: Magnesium: 1.2 mg/dL — ABNORMAL LOW (ref 1.7–2.4)

## 2021-05-05 LAB — CULTURE, BLOOD (ROUTINE X 2)
Culture: NO GROWTH
Culture: NO GROWTH
Special Requests: ADEQUATE

## 2021-05-05 MED ORDER — MAGNESIUM OXIDE -MG SUPPLEMENT 400 (240 MG) MG PO TABS: 400.0000 mg | ORAL_TABLET | Freq: Two times a day (BID) | ORAL | Status: DC

## 2021-05-05 MED ORDER — POLYSACCHARIDE IRON COMPLEX 150 MG PO CAPS
150.0000 mg | ORAL_CAPSULE | Freq: Every day | ORAL | Status: DC
  Administered 2021-05-05: 150 mg via ORAL
  Filled 2021-05-05 (×2): qty 1

## 2021-05-05 MED ORDER — MAGNESIUM OXIDE -MG SUPPLEMENT 400 (240 MG) MG PO TABS: 400.0000 mg | ORAL_TABLET | Freq: Two times a day (BID) | ORAL | 0 refills | Status: AC

## 2021-05-05 MED ORDER — MAGNESIUM SULFATE 4 GM/100ML IV SOLN
4.0000 g | Freq: Once | INTRAVENOUS | Status: AC
  Administered 2021-05-05: 4 g via INTRAVENOUS
  Filled 2021-05-05: qty 100

## 2021-05-05 MED ORDER — HEPARIN SOD (PORK) LOCK FLUSH 100 UNIT/ML IV SOLN
250.0000 [IU] | INTRAVENOUS | Status: AC | PRN
Start: 2021-05-05 — End: 2021-05-05
  Administered 2021-05-05: 250 [IU]
  Filled 2021-05-05: qty 2.5

## 2021-05-05 MED ORDER — VANCOMYCIN IV (FOR PTA / DISCHARGE USE ONLY): 1000.0000 mg | Freq: Two times a day (BID) | INTRAVENOUS | 0 refills | Status: DC

## 2021-05-05 MED ORDER — POTASSIUM CHLORIDE CRYS ER 20 MEQ PO TBCR
40.0000 meq | EXTENDED_RELEASE_TABLET | Freq: Once | ORAL | Status: AC
  Administered 2021-05-05: 40 meq via ORAL
  Filled 2021-05-05: qty 2

## 2021-05-05 MED ORDER — POLYSACCHARIDE IRON COMPLEX 150 MG PO CAPS: 150.0000 mg | ORAL_CAPSULE | Freq: Every day | ORAL | 1 refills | Status: DC

## 2021-05-05 MED ORDER — ACETAMINOPHEN 325 MG PO TABS: 650.0000 mg | ORAL_TABLET | Freq: Four times a day (QID) | ORAL | Status: DC | PRN

## 2021-05-05 NOTE — TOC Transition Note (Signed)
Transition of Care [facility]) - CM/SW Discharge Note   Patient Details  Name: Lori Black MRN: [id number] Date of Birth: November 06, 1985  Transition of Care [facility] CM/SW Contact:  Amada Jupiter, LCSW Phone Number: 05/05/2021, 11:24 AM   Clinical Narrative:    Anticipating pt ready for dc today.  Have secured HHRN and HHabx coverage via [facility] who will be ready to start services tomorrow.  Jeri Modena, RN with [facility], to meet with pt and complete abx management education this afternoon.  DME has been delivered to room.  Per MD request, have also provided pt with information on local dental clinic programs via [facility] [place].  No further TOC needs.   Final next level of care: Home w Home Health Services Barriers to Discharge: Barriers Resolved   Patient Goals and CMS Choice Patient states their goals for this hospitalization and ongoing recovery are:: go home      Discharge Placement                       Discharge Plan and Services                DME Arranged: 3-N-1, Walker rolling DME Agency: [employer] Date DME Agency Contacted: 05/04/21 Time DME Agency Contacted: 1240 Representative spoke with at DME Agency: Mayra Reel HH Arranged: RN, IV Antibiotics HH Agency: [facility] Date HH Agency Contacted: 05/05/21 Time HH Agency Contacted: 0900 Representative spoke with at [facility] Agency: [facility] - Werner Lean;   [facility] - Jeri Modena  Social Determinants of Health (SDOH) Interventions     Readmission Risk Interventions No flowsheet data found.

## 2021-05-05 NOTE — Progress Notes (Signed)
Physical Therapy Treatment Patient Details Name: Lori Black MRN: [id number] DOB: [birth year] Today's Date: 05/05/2021    History of Present Illness 35 yo female admitted with septic arthritis of bilateral knees. S/P scope/I&D 04/30/21. Hx of asthma.    PT Comments    Pt progressing well. Improved activity tol, incr knee flexion. She is hopeful to d/c home today   Follow Up Recommendations  No PT follow up     Equipment Recommendations  Rolling walker with 5" wheels;3in1 (PT)    Recommendations for Other Services       Precautions / Restrictions Precautions Precautions: Fall Restrictions Weight Bearing Restrictions: No RLE Weight Bearing: Weight bearing as tolerated LLE Weight Bearing: Weight bearing as tolerated    Mobility  Bed Mobility Overal bed mobility: Modified Independent                  Transfers Overall transfer level: Modified independent                  Ambulation/Gait Ambulation/Gait assistance: Supervision;Modified independent (Device/Increase time) Gait Distance (Feet): 120 Feet Assistive device: Rolling walker (2 wheeled) Gait Pattern/deviations: Step-through pattern     General Gait Details: cues for knee extension on heel strike   Stairs             Wheelchair Mobility    Modified Rankin (Stroke Patients Only)       Balance             Standing balance-Leahy Scale: Fair                              Cognition Arousal/Alertness: Awake/alert Behavior During Therapy: WFL for tasks assessed/performed Overall Cognitive Status: Within Functional Limits for tasks assessed                                        Exercises Total Joint Exercises Knee Flexion: AROM;Both;Seated;5 reps Goniometric ROM: grossly 95* right and left in sitting Marching in Standing: Other (comment) (to simulate stairs. x3 bil LEs. tol well) General Exercises - Lower Extremity Long Arc Quad: AROM;Both;10  reps;Seated    General Comments        Pertinent Vitals/Pain Pain Assessment: 0-10 Pain Score: 2  Pain Location: bil knees Pain Descriptors / Indicators: Discomfort;Sore Pain Intervention(s): Limited activity within patient's tolerance;Monitored during session    Home Living                      Prior Function            PT Goals (current goals can now be found in the care plan section) Acute Rehab PT Goals Patient Stated Goal: regain PLOF PT Goal Formulation: With patient Time For Goal Achievement: 05/15/21 Potential to Achieve Goals: Good Progress towards PT goals: Progressing toward goals    Frequency    Min 5X/week      PT Plan Current plan remains appropriate    Co-evaluation              AM-PAC PT "6 Clicks" Mobility   Outcome Measure  Help needed turning from your back to your side while in a flat bed without using bedrails?: None Help needed moving from lying on your back to sitting on the side of a flat bed without using bedrails?: None Help needed moving to and  from a bed to a chair (including a wheelchair)?: None Help needed standing up from a chair using your arms (e.g., wheelchair or bedside chair)?: None Help needed to walk in hospital room?: None Help needed climbing 3-5 steps with a railing? : A Little 6 Click Score: 23    End of Session Equipment Utilized During Treatment: Gait belt Activity Tolerance: Patient tolerated treatment well Patient left: in bed;with call bell/phone within reach   PT Visit Diagnosis: Other abnormalities of gait and mobility (R26.89)     Time: 7989-2119 PT Time Calculation (min) (ACUTE ONLY): 16 min  Charges:  $Gait Training: 8-22 mins                     Delice Bison, PT  Acute Rehab Dept ([facility]) [phone] Pager [phone]  05/05/2021   [facility] 05/05/2021, 11:02 AM

## 2021-05-05 NOTE — Progress Notes (Signed)
Appreciate ID and Med Service Cxs still pending Continue WBAT and activities as tolerated  Will see back for suture removal in one week  Myrene Galas, MD [facility], [facility] [phone]

## 2021-05-05 NOTE — Discharge Summary (Signed)
Physician Discharge Summary  Lori Black DGL:[id number] DOB: [birth year] DOA: 04/29/2021  PCP: Tanda Rockers, MD  Admit date: 04/29/2021 Discharge date: 05/05/2021  Time spent: 55 minutes  Recommendations for Outpatient Follow-up:  Follow-up with Tanda Rockers, MD in 2 weeks.  On follow-up patient will need a basic metabolic profile, magnesium level checked to follow-up on electrolytes and renal function. Follow-up with Dr. Marcelino Scot, orthopedics in 1 week. Follow-up with Dr.Comer, ID as scheduled on 05/14/2021.   Discharge Diagnoses:  Principal Problem:   Septic arthritis of knee, bilateral ([place]) Active Problems:   Chronic asthma, mild persistent, uncomplicated   Septic arthritis (HCC)   Hypomagnesemia   Hypokalemia   Discharge Condition: Stable and improved  Diet recommendation: Regular  Filed Weights   04/29/21 2219  Weight: 68 kg    History of present illness:  HPI per Dr. Sharyn Dross Colburn is a 35 y.o. female with history of asthma and chronic anemia presented to the ER with complaints of bilateral knee pain.  Patient stated her right knee started hurting and swelling 2 days ago with subjective feeling of fever chills.  Left knee started swelling over the last 24 hours and at this point patient decided to come to the ER.  Denied any trauma or any IV drug abuse.  Denied any insect bites.  Has not had any previous joint swelling skin rash or any GI symptoms.   ED Course: In the ER patient had temperature of 99.4 F and on exam patient's left knee is more swollen than the right knee.  X-rays reveal mild joint effusion.  Labs show hemoglobin of 8.6 potassium of 2.9 CRP of 0.8 and arthrocentesis was done which shows WBC count of 1300 with gram-positive cocci in gram stain.  On-call orthopedic surgeon Dr. Marcelino Scot was consulted patient has been started on vancomycin and ceftriaxone.  Admitted for further management.  Potassium replacement was ordered COVID test was  negative.  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  Patient denied any IV drug use. -Patient seen in consultation by orthopedics and patient underwent bilateral aspiration and arthroscopic lavage of the knees on 04/30/2022 with cultures obtained which were positive for methicillin-resistant Staphylococcus epidermis. -CT maxillofacial which was done showed poor dentition with multiple carious teeth and multifocal periapical lucencies, mild to moderate mucosal thickening within the inferior maxillary sinuses, bilaterally.  Superimposed 13 mm left maxillary sinus mucous retention cyst.  Small mucous retention cyst within the right ethmoid air cell and within a pneumatized left middle nasal turbinate. -ID was consulted and saw the patient during the hospitalization patient was initially on IV vancomycin IV Rocephin pending finalization of culture results. -Patient improved clinically and will be discharged home on IV vancomycin to complete a 2-week course of IV antibiotics with end date of 05/14/2021. -Outpatient follow-up with ID and orthopedics.  2.  Chronic iron deficiency anemia -Patient noted to be anemic during the hospitalization with hemoglobin stable at 7.4 by day of discharge. -Anemia panel obtained with a iron level of 12, ferritin of 17, TIBC of 313. -Patient remained asymptomatic. -Patient be discharged home on oral iron supplementation. -Outpatient follow-up with PCP.  3.  Hypomagnesemia/hypokalemia -Repleted. -Patient will be discharged home on oral magnesium supplementation x1 week. -Outpatient follow-up with PCP.  4.  Mild intermittent asthma -Remained stable throughout the hospitalization.  Procedures: CT maxillofacial 05/03/2021 Plain films of bilateral knees  04/29/2021 Aspiration and arthroscopic lavage of bilateral knees for infection per Dr.  Marcelino Scot, orthopedics 04/30/2022 PICC line   Consultations: Orthopedics: Dr. Marcelino Scot 04/30/2021 Infectious disease: Dr. Linus Salmons 05/03/2021  Discharge Exam: Vitals:   05/05/21 0742 05/05/21 1420  BP:  123/78  Pulse:  100  Resp:  16  Temp:  97.8 F (36.6 C)  SpO2: 97% 100%    General: NAD Cardiovascular: RRR no murmurs rubs or gallops.  No JVD.  No lower extremity edema Respiratory: Clear to auscultation bilaterally.  No wheezes, no crackles, no rhonchi.  Discharge Instructions   Discharge Instructions     [facility] pharmacist to adjust dose for Vancomycin, Aminoglycosides and other anti-infective therapies as requested by physician.   Complete by: As directed    [facility] to provide Cath Flo 55m   Complete by: As directed    Administer for PICC line occlusion and as ordered by physician for other access device issues.   Anaphylaxis Kit: Provided to treat any anaphylactic reaction to the medication being provided to the patient if First Dose or when requested by physician   Complete by: As directed    Epinephrine 1545mml vial / amp: Administer 0.45m80m0.45ml59mubcutaneously once for moderate to severe anaphylaxis, nurse to call physician and pharmacy when reaction occurs and call 911 if needed for immediate care   Diphenhydramine 50mg65mIV vial: Administer 25-50mg 66mM PRN for first dose reaction, rash, itching, mild reaction, nurse to call physician and pharmacy when reaction occurs   Sodium Chloride 0.9% NS 500ml I77mdminister if needed for hypovolemic blood pressure drop or as ordered by physician after call to physician with anaphylactic reaction   Change dressing on IV access line weekly and PRN   Complete by: As directed    Diet general   Complete by: As directed    Discharge wound care:   Complete by: As directed    As above per orthopedics   Flush IV access with Sodium Chloride 0.9% and Heparin 10 units/ml or 100 units/ml   Complete by: As directed     Home infusion instructions - [facility]   Complete by: As directed    Instructions: Flush IV access with Sodium Chloride 0.9% and Heparin 10units/ml or 100units/ml   Change dressing on IV access line: Weekly and PRN   Instructions Cath Flo 2mg: Ad55mister for PICC Line occlusion and as ordered by physician for other access device   [facility] pharmacist to adjust dose for: Vancomycin, Aminoglycosides and other anti-infective therapies as requested by physician   Increase activity slowly   Complete by: As directed    Method of administration may be changed at the discretion of home infusion pharmacist based upon assessment of the patient and/or caregiver's ability to self-administer the medication ordered   Complete by: As directed       Allergies as of 05/05/2021   No Known Allergies      Medication List     TAKE these medications    acetaminophen 325 MG tablet Commonly known as: TYLENOL Take 2 tablets (650 mg total) by mouth every 6 (six) hours as needed for mild pain (or Fever >/= 101).   albuterol (2.5 MG/3ML) 0.083% nebulizer solution Commonly known as: PROVENTIL Take 3 mLs (2.5 mg total) by nebulization every 4 (four) hours as needed for wheezing or shortness of breath.   albuterol 108 (90 Base) MCG/ACT inhaler Commonly known as: VENTOLIN HFA Inhale 2 puffs into the lungs every  4 (four) hours as needed for wheezing or shortness of breath.   budesonide-formoterol 160-4.5 MCG/ACT inhaler Commonly known as: Symbicort Inhale 2 puffs into the lungs 2 (two) times daily.   iron polysaccharides 150 MG capsule Commonly known as: NIFEREX Take 1 capsule (150 mg total) by mouth daily. Start taking on: May 06, 2021   magnesium oxide 400 (240 Mg) MG tablet Commonly known as: MAG-OX Take 1 tablet (400 mg total) by mouth 2 (two) times daily for 7 days.   vancomycin  IVPB Inject 1,000 mg into the vein every 12 (twelve) hours for 9 days. Indication:   Septic arthritis First Dose: Yes Last Day of Therapy:  05/14/21 Labs - _0 /20/22 1214   05/05/21 0000  Discharge wound care:       Comments: As above per orthopedics   05/05/21 1518           No Known Allergies  Follow-up Information     Tanda Rockers, MD. Schedule an appointment as soon as possible for a visit in 2 week(s).   Specialty: Pulmonary Disease Contact information: [place] [address] [phone]         Thayer Headings, MD Follow up on 05/14/2021.   Specialty: Infectious Diseases Why: f/u as scheduled Contact information: [address] [place] [address] [phone]         Altamese Offerle, MD. Schedule an appointment as soon as possible for a visit in 1 week(s).   Specialty: Orthopedic Surgery Contact information: [place] [address] [phone]                  The results of significant diagnostics from this hospitalization (including  imaging, microbiology, ancillary and laboratory) are listed below for reference.    Significant Diagnostic Studies: CT MAXILLOFACIAL W CONTRAST  Result Date: 05/03/2021 CLINICAL DATA:  Provided history: Imbedded or impacted teeth; has septic joint in knee so question source dental carie/abscess. EXAM: CT MAXILLOFACIAL WITH CONTRAST TECHNIQUE: Multidetector CT imaging of the maxillofacial structures was performed with intravenous contrast. Multiplanar CT image reconstructions were also generated. CONTRAST:  51m OMNIPAQUE IOHEXOL 350  MG/ML SOLN COMPARISON:  CT of the paranasal sinuses 08/07/2018. FINDINGS: Osseous: Poor dentition with multiple carious teeth and multifocal periapical lucencies. Periapical lucency surrounds a carious and fractured left maxillary first molar (series 8, image 53). Subtle periapical lucency surrounds the left maxillary second molar (series 8, image 54). Subtle periapical lucency also surrounds the right maxillary first and second molars (series 8, images 28 and 26). Additionally, periapical lucency is question surrounding the right mandibular third molar, which is carious (series 8, image 25). No maxillofacial fracture is defied Orbits: No inflammatory changes or acute finding within the orbits. Sinuses: Mild to moderate mucosal thickening within the inferior maxillary sinuses bilaterally. Superimposed 13 mm left maxillary sinus mucous retention cyst. Additionally, there is a subcentimeter nonspecific calcified focus within the inferior right maxillary sinus. Small mucous retention cysts within the right ethmoid air cells and within a pneumatized left middle nasal turbinate. Soft tissues: No appreciable inflammatory changes within the visualized maxillofacial or upper neck soft tissues. Limited intracranial: No evidence of acute intracranial mallet within the field of view. IMPRESSION: Poor dentition with multiple carious teeth and multifocal periapical lucencies, as described.  Mild-to-moderate mucosal thickening within the inferior maxillary sinuses, bilaterally. Superimposed 13 mm left maxillary sinus mucous retention cyst. Small mucous retention cysts within a right ethmoid air cell and within a pneumatized left middle nasal turbinate. Electronically Signed   By: Kellie Simmering DO   On: 05/03/2021 12:55   DG Knee Complete 4 Views Left  Result Date: 04/29/2021 CLINICAL DATA:  Knee pain and swelling, no known injury, initial encounter EXAM: LEFT KNEE - COMPLETE 4+ VIEW COMPARISON:  None. FINDINGS: No acute fracture or dislocation is noted. Minimal joint fluid is seen. Meniscal calcifications are identified. No other focal abnormality is seen. IMPRESSION: Minimal joint fluid. No acute bony abnormality is noted. Electronically Signed   By: Inez Catalina M.D.   On: 04/29/2021 23:48   DG Knee Complete 4 Views Right  Result Date: 04/29/2021 CLINICAL DATA:  Initial evaluation for acute pain and swelling. EXAM: RIGHT KNEE - COMPLETE 4+ VIEW COMPARISON:  None. FINDINGS: No acute fracture dislocation. Small joint effusion present within the suprapatellar recess. Minimal juxta-articular spurring noted about the knee. Osseous mineralization normal. No discrete osseous lesions. No other visible soft tissue abnormality. IMPRESSION: 1. No acute osseous abnormality about the right knee. 2. Small joint effusion. 3. Minimal degenerative osteoarthrosis.  In Electronically Signed   By: Jeannine Boga M.D.   On: 04/29/2021 23:46   [place] EKG SITE RITE  Result Date: 05/03/2021 If Site Rite image not attached, placement could not be confirmed due to current cardiac rhythm.   Microbiology: Recent Results (from the past 240 hour(s))  Resp Panel by RT-PCR (Flu A&B, Covid) Nasopharyngeal Swab     Status: None   Collection Time: 04/30/21 12:19 AM   Specimen: Nasopharyngeal Swab; Nasopharyngeal(NP) swabs in vial transport medium  Result Value Ref Range Status   SARS Coronavirus 2 by RT PCR  NEGATIVE NEGATIVE Final    Comment: (NOTE) SARS-CoV-2 target nucleic acids are NOT DETECTED.  The SARS-CoV-2 RNA is generally detectable in upper respiratory specimens during the acute phase of infection. The lowest concentration of SARS-CoV-2 viral copies this assay can detect is 138 copies/mL. A negative result does not preclude SARS-Cov-2 infection and should not be used as the sole basis for treatment or other patient management decisions. A negative result may occur with  improper specimen collection/handling, submission of specimen other than nasopharyngeal swab, presence of  viral mutation(s) within the areas targeted by this assay, and inadequate number of viral copies(<138 copies/mL). A negative result must be combined with clinical observations, patient history, and epidemiological information. The expected result is Negative.  Fact Sheet for Patients:  EntrepreneurPulse.com.au  Fact Sheet for Healthcare Providers:  IncredibleEmployment.be  This test is no t yet approved or cleared by the [place] FDA and  has been authorized for detection and/or diagnosis of SARS-CoV-2 by FDA under an Emergency Use Authorization (EUA). This EUA will remain  in effect (meaning this test can be used) for the duration of the COVID-19 declaration under Section 564(b)(1) of the Act, 21 U.S.C.section 360bbb-3(b)(1), unless the authorization is terminated  or revoked sooner.       Influenza A by PCR NEGATIVE NEGATIVE Final   Influenza B by PCR NEGATIVE NEGATIVE Final    Comment: (NOTE) The Xpert Xpress SARS-CoV-2/FLU/RSV plus assay is intended as an aid in the diagnosis of influenza from Nasopharyngeal swab specimens and should not be used as a sole basis for treatment. Nasal washings and aspirates are unacceptable for Xpert Xpress SARS-CoV-2/FLU/RSV testing.  Fact Sheet for Patients: EntrepreneurPulse.com.au  Fact Sheet for  Healthcare Providers: IncredibleEmployment.be  This test is not yet approved or cleared by the [place] FDA and has been authorized for detection and/or diagnosis of SARS-CoV-2 by FDA under an Emergency Use Authorization (EUA). This EUA will remain in effect (meaning this test can be used) for the duration of the COVID-19 declaration under Section 564(b)(1) of the Act, 21 U.S.C. section 360bbb-3(b)(1), unless the authorization is terminated or revoked.  Performed at [facility], [place] [address] [place], [place] [address]   Body fluid culture w Gram Stain     Status: None   Collection Time: 04/30/21  1:10 AM   Specimen: Synovium; Body Fluid  Result Value Ref Range Status   Specimen Description   Final    SYNOVIAL Performed at [facility] [address], [place], [place] [address]    Special Requests   Final    NONE Performed at [facility], [place] [address] [place], [place] [address]    Gram Stain   Final    ABUNDANT WBC PRESENT,BOTH PMN AND MONONUCLEAR RARE GRAM POSITIVE COCCI Gram Stain Report Called to,Read Back By and Verified With: Tamala Fothergill, RN @ [phone] ON 04/30/21 Sandy Salaam Performed at [facility], [place] [address], [place], [place] [address]    Culture   Final    NO GROWTH 3 DAYS Performed at [facility], [place] [address] [place], [place] [address]    Report Status 05/03/2021 FINAL  Final  Urine Culture     Status: Abnormal   Collection Time: 04/30/21  2:30 AM   Specimen: Urine, Clean Catch  Result Value Ref Range Status   Specimen Description   Final    URINE, CLEAN CATCH Performed at [facility], [place] [address], [place], [place] [address]    Special Requests   Final    NONE Performed at [facility], [place] [address] [place], [place] [address]    Culture MULTIPLE SPECIES PRESENT, SUGGEST RECOLLECTION (A)  Final    Report Status 05/01/2021 FINAL  Final  Blood culture (routine x 2)     Status: None   Collection Time: 04/30/21  3:24 AM   Specimen: BLOOD  Result Value Ref Range Status   Specimen Description   Final  BLOOD LEFT ANTECUBITAL Performed at [facility] [address] [place], [place] [address]    Special Requests   Final    BOTTLES DRAWN AEROBIC AND ANAEROBIC Blood Culture results may not be optimal due to an inadequate volume of blood received in culture bottles Performed at [facility] [address], [place], [place] [address]    Culture   Final    NO GROWTH 5 DAYS Performed at [facility], [place] [address] [place], [place] [address]    Report Status 05/05/2021 FINAL  Final  Blood culture (routine x 2)     Status: None   Collection Time: 04/30/21  3:36 AM   Specimen: BLOOD  Result Value Ref Range Status   Specimen Description   Final    BLOOD RIGHT ANTECUBITAL Performed at [facility] [address] [place], [place] [address]    Special Requests   Final    BOTTLES DRAWN AEROBIC AND ANAEROBIC Blood Culture adequate volume Performed at [facility] [address], [place], [place] [address]    Culture   Final    NO GROWTH 5 DAYS Performed at [facility], [place] [address], [place], [place] [address]    Report Status 05/05/2021 FINAL  Final  Surgical PCR screen     Status: Abnormal   Collection Time: 04/30/21 10:57 AM   Specimen: Nasal Mucosa; Nasal Swab  Result Value Ref Range Status   MRSA, PCR NEGATIVE NEGATIVE Final   Staphylococcus aureus POSITIVE (A) NEGATIVE Final    Comment: (NOTE) The Xpert SA Assay (FDA approved for NASAL specimens in patients 72 years of age and older), is one component of a comprehensive surveillance program. It is not intended to diagnose infection nor to guide or monitor treatment. Performed at [facility], [place] [address] [place], [place] [address]   Body fluid culture w Gram Stain     Status: None   Collection Time: 04/30/21 10:27 PM   Specimen: Synovial, Right Knee; Body Fluid  Result Value Ref Range Status   Specimen Description SYNOVIAL  Final   Special Requests RIGHT KNEE  Final   Gram Stain   Final    MODERATE WBC PRESENT,BOTH PMN AND MONONUCLEAR NO ORGANISMS SEEN    Culture   Final    NO GROWTH 3 DAYS Performed at [facility], [address], [place], [place] [address]    Report Status 05/04/2021 FINAL  Final  Body fluid culture w Gram Stain     Status: None   Collection Time: 04/30/21 10:28 PM   Specimen: Synovial, Left Knee; Body Fluid  Result Value Ref Range Status   Specimen Description Synov, Knee  Final   Special Requests NONE  Final   Gram Stain   Final    MODERATE WBC PRESENT, PREDOMINANTLY PMN NO ORGANISMS SEEN    Culture   Final    RARE STAPHYLOCOCCUS EPIDERMIDIS CRITICAL RESULT CALLED TO, READ BACK BY AND VERIFIED WITH: DR Eliseo Squires [id number] AT 1040 BY CM Performed at [facility], [place] [address], [place], [place] [address]    Report Status 05/05/2021 FINAL  Final   Organism ID, Bacteria STAPHYLOCOCCUS EPIDERMIDIS  Final      Susceptibility   Staphylococcus epidermidis - MIC*    CIPROFLOXACIN <=0.5 SENSITIVE Sensitive     ERYTHROMYCIN >=8 RESISTANT Resistant     GENTAMICIN <=0.5 SENSITIVE Sensitive     OXACILLIN >=4  RESISTANT Resistant     TETRACYCLINE 2 SENSITIVE Sensitive     VANCOMYCIN 1 SENSITIVE Sensitive     TRIMETH/SULFA 160 RESISTANT Resistant     CLINDAMYCIN <=0.25 SENSITIVE Sensitive     RIFAMPIN <=0.5 SENSITIVE Sensitive     Inducible Clindamycin NEGATIVE Sensitive     * RARE STAPHYLOCOCCUS EPIDERMIDIS     Labs: Basic Metabolic Panel: Recent Labs  Lab 04/29/21 2247 05/01/21 0743 05/02/21 0303 05/03/21 0318 05/05/21 0330 05/05/21 0904  NA 136 138 137 137  --  138  K 2.9* 3.3* 3.1* 3.6  --  3.4*  CL 97* 98 102 98  --  98  CO2 _0 --  31  GLUCOSE 187* 168* 140* 123*  --  115*  BUN _1 --  15  CREATININE 0.66 0.67 0.70 0.64  --  0.63  CALCIUM 8.9 8.3* 8.0* 8.8*  --  8.8*  MG  --  1.0* 1.4* 1.2* 1.2*  --    Liver Function Tests: Recent Labs  Lab 04/29/21 2247  AST 15  ALT 13  ALKPHOS 63  BILITOT 1.2  PROT 7.3  ALBUMIN 4.1   No results for input(s): LIPASE, AMYLASE in the last 168 hours. No results for input(s): AMMONIA in the last 168 hours. CBC: Recent Labs  Lab 04/29/21 2247 05/01/21 0743 05/02/21 0303 05/03/21 0318 05/05/21 0904  WBC 9.1 11.7* 11.5* 8.3 7.9  NEUTROABS 6.6 10.4* 8.8*  --  5.1  HGB 8.6* 8.9* 7.6* 7.5* 7.4*  HCT 29.1* 29.8* 25.3* 26.0* 25.3*  MCV 70.3* 70.6* 70.3* 71.8* 71.3*  PLT 259 259 251 261 247   Cardiac Enzymes: Recent Labs  Lab 04/30/21 0057  CKTOTAL 43   BNP: BNP (last 3 results) No results for input(s): BNP in the last 8760 hours.  ProBNP (last 3 results) No results for input(s): PROBNP in the last 8760 hours.  CBG: No results for input(s): GLUCAP in the last 168 hours.     Signed:  Irine Seal MD.  [facility] 05/05/2021, 3:58 PM

## 2021-05-05 NOTE — Plan of Care (Signed)

## 2021-05-05 NOTE — Progress Notes (Signed)
PHARMACY CONSULT NOTE FOR:  OUTPATIENT  PARENTERAL ANTIBIOTIC THERAPY (OPAT)  Indication: Septic arthritis  Regimen: Vancomycin 1000 mg Q12H End date: 05/14/21  IV antibiotic discharge orders are pended. To discharging provider:  please sign these orders via discharge navigator,  Select New Orders & click on the button choice - Manage This Unsigned Work.    Thank you for allowing pharmacy to be a part of this patient's care.  Andree Coss, PharmD Candidate 05/05/2021, 11:11 AM

## 2021-05-06 DIAGNOSIS — J4530 Mild persistent asthma, uncomplicated: Secondary | ICD-10-CM | POA: Diagnosis not present

## 2021-05-06 DIAGNOSIS — Z7951 Long term (current) use of inhaled steroids: Secondary | ICD-10-CM | POA: Diagnosis not present

## 2021-05-06 DIAGNOSIS — Z792 Long term (current) use of antibiotics: Secondary | ICD-10-CM | POA: Diagnosis not present

## 2021-05-06 DIAGNOSIS — M009 Pyogenic arthritis, unspecified: Secondary | ICD-10-CM | POA: Diagnosis not present

## 2021-05-06 DIAGNOSIS — D649 Anemia, unspecified: Secondary | ICD-10-CM | POA: Diagnosis not present

## 2021-05-06 DIAGNOSIS — Z5181 Encounter for therapeutic drug level monitoring: Secondary | ICD-10-CM | POA: Diagnosis not present

## 2021-05-06 DIAGNOSIS — Z7952 Long term (current) use of systemic steroids: Secondary | ICD-10-CM | POA: Diagnosis not present

## 2021-05-06 DIAGNOSIS — Z452 Encounter for adjustment and management of vascular access device: Secondary | ICD-10-CM | POA: Diagnosis not present

## 2021-05-11 DIAGNOSIS — Z20822 Contact with and (suspected) exposure to covid-19: Secondary | ICD-10-CM | POA: Diagnosis not present

## 2021-05-11 DIAGNOSIS — M009 Pyogenic arthritis, unspecified: Secondary | ICD-10-CM | POA: Diagnosis not present

## 2021-05-13 DIAGNOSIS — Z792 Long term (current) use of antibiotics: Secondary | ICD-10-CM | POA: Diagnosis not present

## 2021-05-13 DIAGNOSIS — Z5181 Encounter for therapeutic drug level monitoring: Secondary | ICD-10-CM | POA: Diagnosis not present

## 2021-05-14 ENCOUNTER — Ambulatory Visit (INDEPENDENT_AMBULATORY_CARE_PROVIDER_SITE_OTHER): Payer: Medicare Other | Admitting: Internal Medicine

## 2021-05-14 ENCOUNTER — Other Ambulatory Visit: Payer: Self-pay

## 2021-05-14 ENCOUNTER — Telehealth: Payer: Self-pay

## 2021-05-14 ENCOUNTER — Ambulatory Visit: Payer: Self-pay | Admitting: Allergy

## 2021-05-14 ENCOUNTER — Encounter: Payer: Self-pay | Admitting: Internal Medicine

## 2021-05-14 VITALS — BP 122/76 | HR 89 | Temp 99.1°F | Wt 146.0 lb

## 2021-05-14 DIAGNOSIS — Z452 Encounter for adjustment and management of vascular access device: Secondary | ICD-10-CM

## 2021-05-14 DIAGNOSIS — M009 Pyogenic arthritis, unspecified: Secondary | ICD-10-CM | POA: Diagnosis not present

## 2021-05-14 DIAGNOSIS — Z5181 Encounter for therapeutic drug level monitoring: Secondary | ICD-10-CM

## 2021-05-14 MED ORDER — DOXYCYCLINE HYCLATE 100 MG PO TBEC: 100.0000 mg | DELAYED_RELEASE_TABLET | Freq: Two times a day (BID) | ORAL | 0 refills | Status: DC

## 2021-05-14 NOTE — Progress Notes (Signed)
   Subjective:    Patient ID: Lori Black, female    DOB: 06/06/1986, 35 y.o.   MRN: [id number]  HPI She is here for hsfu She was hospitalized earlier this month with septic arthritis bilaterally, L>R and culture grew from the left knee with Staph epidermidis.  She underwent arthroscopic debridement by Dr. Carola Frost on 05/02/21 of both knees and has been on IV vancomycin and doing well.  No issues with creat.  Picc working well, no complaints.  Hopefull to get picc line out.  Knees with no significant concerns. No associated rash or diarrhea.    Review of Systems  Constitutional:  Negative for chills and fever.  Gastrointestinal:  Negative for diarrhea and nausea.  Skin:  Negative for rash.      Objective:   Physical Exam Eyes:     General: No scleral icterus. Pulmonary:     Effort: Pulmonary effort is normal.  Musculoskeletal:     Comments: Bilateral knees with no significant edema, no warmth  Skin:    Findings: No rash.  Neurological:     General: No focal deficit present.     Mental Status: She is alert.    Sh: no tobacco      Assessment & Plan:

## 2021-05-14 NOTE — Telephone Encounter (Signed)
Spoke to [name] with [facility] to give verbal orders on patient per Dr.Comer - PICC line to be pulled on 05/15/2021. Mary repeated verbal orders back to me and verbalized her understanding.     Jendayi Berling Lesli Albee, CMA

## 2021-05-14 NOTE — Assessment & Plan Note (Signed)
Doing well and no concerns. Will have this removed by home health

## 2021-05-14 NOTE — Assessment & Plan Note (Signed)
Labs with no significant concerns, creat stable.

## 2021-05-14 NOTE — Assessment & Plan Note (Signed)
Doing well and no new concerns.  I will transition her to oral doxycyline for 2 weeks and have her follow up with me then.

## 2021-05-17 DIAGNOSIS — Z20822 Contact with and (suspected) exposure to covid-19: Secondary | ICD-10-CM | POA: Diagnosis not present

## 2021-05-24 DIAGNOSIS — J45909 Unspecified asthma, uncomplicated: Secondary | ICD-10-CM | POA: Diagnosis not present

## 2021-05-24 DIAGNOSIS — J309 Allergic rhinitis, unspecified: Secondary | ICD-10-CM | POA: Diagnosis not present

## 2021-05-24 DIAGNOSIS — D649 Anemia, unspecified: Secondary | ICD-10-CM | POA: Diagnosis not present

## 2021-05-24 DIAGNOSIS — Z0000 Encounter for general adult medical examination without abnormal findings: Secondary | ICD-10-CM | POA: Diagnosis not present

## 2021-06-01 ENCOUNTER — Encounter: Payer: Self-pay | Admitting: Internal Medicine

## 2021-06-01 ENCOUNTER — Ambulatory Visit (INDEPENDENT_AMBULATORY_CARE_PROVIDER_SITE_OTHER): Payer: Medicare Other | Admitting: Internal Medicine

## 2021-06-01 ENCOUNTER — Other Ambulatory Visit: Payer: Self-pay

## 2021-06-01 VITALS — BP 127/86 | HR 86 | Temp 98.3°F | Wt 145.0 lb

## 2021-06-01 DIAGNOSIS — M009 Pyogenic arthritis, unspecified: Secondary | ICD-10-CM | POA: Diagnosis not present

## 2021-06-01 DIAGNOSIS — Z452 Encounter for adjustment and management of vascular access device: Secondary | ICD-10-CM | POA: Diagnosis not present

## 2021-06-01 NOTE — Assessment & Plan Note (Signed)
She is doing well and no concerns off of antibiotics.   Seems resolved.  Will continue to observe off of antibiotics.   rtc prn

## 2021-06-01 NOTE — Assessment & Plan Note (Signed)
This was removed without issue.  No problems noted at the site.

## 2021-06-01 NOTE — Progress Notes (Signed)
   Subjective:    Patient ID: Lori Black, female    DOB: 1985-12-20, 35 y.o.   MRN: [id number]  HPI Here for follow up of bilateral knee septic arthritis. Debrided by Dr. Carola Frost last month and culture with Staph epidermidis.  She received 2 weeks of IV vancomycin and I transitioned her to oral doxycyline for another two weeks but she stopped taking that after about 4 days due to intolerance.  Her knees are doing well, no significant swelling.  Some soreness related to increased activity.    Review of Systems  Constitutional:  Negative for fatigue.  Gastrointestinal:  Negative for diarrhea and nausea.      Objective:   Physical Exam Eyes:     General: No scleral icterus. Pulmonary:     Effort: Pulmonary effort is normal.  Musculoskeletal:     Comments: Bilateral knees with no edema, no warmth  Neurological:     Mental Status: She is alert.          Assessment & Plan:

## 2021-06-22 ENCOUNTER — Ambulatory Visit: Payer: Medicare Other | Admitting: Internal Medicine

## 2021-06-28 ENCOUNTER — Encounter: Payer: Self-pay | Admitting: Internal Medicine

## 2021-06-28 ENCOUNTER — Ambulatory Visit (INDEPENDENT_AMBULATORY_CARE_PROVIDER_SITE_OTHER): Payer: Medicare Other | Admitting: Internal Medicine

## 2021-06-28 ENCOUNTER — Other Ambulatory Visit: Payer: Self-pay

## 2021-06-28 DIAGNOSIS — J4530 Mild persistent asthma, uncomplicated: Secondary | ICD-10-CM

## 2021-06-28 DIAGNOSIS — J310 Chronic rhinitis: Secondary | ICD-10-CM

## 2021-06-28 MED ORDER — BUDESONIDE-FORMOTEROL FUMARATE 80-4.5 MCG/ACT IN AERO: INHALATION_SPRAY | RESPIRATORY_TRACT | 11 refills | Status: DC

## 2021-06-28 MED ORDER — ALBUTEROL SULFATE HFA 108 (90 BASE) MCG/ACT IN AERS: INHALATION_SPRAY | RESPIRATORY_TRACT | 2 refills | Status: DC

## 2021-06-28 NOTE — Assessment & Plan Note (Signed)
Onset in childhood FENO 03/17/2017  =   30 on final day of prednisone - Spirometry 03/17/2017  wnl s curvature on last day of pred p saba 5 h prior  - 03/17/2017   Start symb 160 2bid - 01/16/2018  After extensive coaching inhaler device  effectiveness =    75% (short Ti)   - FENO 01/16/2018  =   62 on symb 160 2bid  - Spirometry 01/16/2018  wnl with  Min curvature  - 01/16/2018 added singulair - Allergy profile 04/17/2018 >  Eos 0.2 /  IgE  128 RAST pos cat > dog, dust and oak trees - FENO 04/17/2018  =  30 p  symb 160/ singulair  - 06/2018 did not refill singulair as no better     - 07/14/2020 referred to allergy for pnds  With asthma well controlled > referred again  12/18/20  - 06/28/2021  After extensive coaching inhaler device,  effectiveness =    90%   Due to concern re recent sepsis > try symbicort 80 2bid as long as still satisfies the rule of 2s and f/u here in 6 m unless asthma care assumed by allergy in meantime/ reviewed with pt         Each maintenance medication was reviewed in detail including emphasizing most importantly the difference between maintenance and prns and under what circumstances the prns are to be triggered using an action plan format where appropriate.  Total time for H and P, chart review, counseling, reviewing hfa device(s) and generating customized AVS unique to this post hosp office visit / same day charting = 31 min

## 2021-06-28 NOTE — Progress Notes (Signed)
Subjective:     Patient ID: Lori Black, female   DOB: 09/27/1986    MRN: [id number]  Brief patient profile:  35   yowf never smoker asthma since childhood never free of symptoms or need for inhaler and frequent prednisone rx x every few months [facility] 2017 with no change pattern > referred to pulmonary clinic 03/17/2017 by EDP p 3rd ER trip in 6 months with one admit for asthma        History of Present Illness  03/17/2017 1st [facility] office visit/ Iriana Artley   Chief Complaint  Patient presents with   Pulmonary Consult    Self referral. Pt states that she was dxed with Asthma at birth. [place] [facility] She has been out of her Symbicort for the past month. She has been having increased SOB and cough with thick, yellow sputum recently.  She has been using albuterol neb and albuterol inhaler at least once per day.   just took last prednisone on day of ov Out of symb x sev weeks   But when on symb and pred only needs neb once a day  Has assoc nasal congestions and rattling cough worse at hs and in AM Baseline hfa very poor - see hfa  rec  Plan A = Automatic = symbicort 160 Take 2 puffs first thing in am and then another 2 puffs about 12 hours later.  Work on inhaler technique:   Plan B = Backup Only use your albuterol as a rescue medication       01/16/2018  f/u ov/Brixon Zhen re: chronic asthma since childhood/ on disability due to asthma  maint on symb 160 Chief Complaint  Patient presents with   Follow-up    f/u asthma, allergies, sinus problems causing breathing issues, using nebulizers more  baseline Dyspnea:  Not limited by breathing from desired activities   Cough: worse with pollen Sleep: 30 degrees, occ neb noct. Lots of noct nasal congestion SABA use:  Prior to 2-3 weeks prior to OV  Was not needing any saba now even needing neb rec Please remember to go to the lab department downstairs in the basement  for your tests - we will call you  with the results when they are available. Singulair(montelukast)  10 mg one daily  If not better prednisone x 6 days:   Prednisone 10 mg take  4 each am x 2 days,   2 each am x 2 days,  1 each am x 2 days and stop  Plan A = Automatic = symbicort 160 Take 2 puffs first thing in am and then another 2 puffs about 12 hours later.  Work on Set designerperfecting your inhaler technique:   Plan B = Backup Only use your albuterol as a rescue medication Plan C = Crisis - only use your albuterol nebulizer if you first try Plan B and it fails to help > ok to use the nebulizer up to every 4 hours but if start needing it regularly call for immediate appointment     04/17/2018  f/u ov/Mayson Mcneish re: chronic mild asthma and rhintis with tendence to freq exac Chief Complaint  Patient presents with   Follow-up    Breathing has improved some. She is c/o nasal congestion and "sinus stuff".  She is using her albuterol inhaler 2 x per wk and neb with albuterol once per wk on average.   Dyspnea:  MMRC1 = can walk nl pace, flat grade,  can't hurry or go uphills or steps s sob   Cough: daytime attributes to pnds SABA use: as above, way too much  02: none Sleeping: 1 pillow rec Work on perfecting your inhaler technique:  relax and gently blow all the way out then take a nice smooth deep breath back in, triggering the inhaler at same time you start breathing in.  Hold for up to 5 seconds if you can. Blow out thru nose. Rinse and gargle with water when done [facility] to take prednisone for your nasal symptoms Zyrtec is the best over the counter for nasal congestion as needed Please remember to go to the lab department downstairs in the basement  for your tests - we will call you with the results when they are available. Please schedule a follow up visit in 3 months but call sooner if needed  - consider sinus ct now    07/30/2018  f/u ov/Zenya Hickam re: chronic asthma/ no better on singulair and neither were the nasal symptoms so d/c'd singulair   Chief Complaint  Patient presents with   Follow-up    Breathing is overall doing well. She does c/o sinus and nasal congestion.  She uses her albuterol inhaler 1-2 x per wk and neb once per wk on average.   Dyspnea:  Not limited by breathing from desired activities   Cough: none Sleeping: ok flat  SABA use: a couple time a week  rec No change in medications Work on inhaler technique:   schedule sinus CT > ok x prominent concha bullosa    11/06/2018  f/u ov/Malloree Raboin re:  Chronic asthma no change on vs off singulair, nasal steroids cause bleeding/ lots of nasal congestion Chief Complaint  Patient presents with   Follow-up    sinus congestion, little cough with clear mucus, SOB with outside activity,  Dyspnea:  Not limited by breathing from desired activities  / worse outdoor Cough: minimal / outdoors mostly  Sleeping:  Flat bed / a bunch of pillows SABA use: once or twice a week rec Work on inhaler technique:    Please see patient coordinator before you leave today  to schedule ent referral > marcellino eval 11/20/18 rec flonase/ns irrigation and allegra > helps some       02/06/2019  f/u ov/Lindsey Hommel re: chronic asthma Chief Complaint  Patient presents with   Follow-up    f/u asthma   Dyspnea:  Does ok at nl pace some hills/steps do make her sob   Cough: minimal with pollen exp moslty  Sleeping: bed is flat / 2 pillows  SABA use: while on symbicort 160 2bid needs once a week saba  02: none rec Plan A = Automatic = symbicort 160 x 2 each am and then the pm dose optional  Plan B = Backup Only use your albuterol inhaler as a rescue medication   Plan C = Crisis - only use your albuterol nebulizer if you first try Plan B and it fails to help > ok to use the nebulizer up to every 4 hours but if start needing it regularly call for immediate appointment    05/07/2019  f/u ov/Jaimey Franchini re:  Chronic asthma  Chief Complaint  Patient presents with   Follow-up    dry cough for the past 2 days.  She is having increased SOB on hot days. She is using the albuterol inhaler at least every other day and neb about 3 x per wk.  Dyspnea:  MMRC1 = can walk nl pace,  flat grade, can't hurry or go uphills or steps s sob   Cough: some mostly daytime, min prod Sleeping: bed is flat, 2 pillows  SABA use: as above - overusing when "over does it"  02: none  rec Plan A = Automatic = symbicort 160 Take 2 puffs first thing in am and then another 2 puffs about 12 hours later.  Plan B = Backup Only use your albuterol inhaler as a rescue medication   Plan C = Crisis - only use your albuterol nebulizer if you first try Plan B      11/21/2019  f/u ov/Garren Greenman re: chronic asthma  / rhinitis with ent eval rec flonase but not using due to nose bleeds Chief Complaint  Patient presents with   Follow-up    Breathing is overall doing well. She states continues to have issues with sinus congestion. She is using her albuterol inhaler and neb both a few x per month.   Dyspnea:  MMRC1 = can walk nl pace, flat grade, can't hurry or go uphills or steps s sob   Cough: minimal Sleeping: bed is flat  2 pllows  SABA use: much less now maint on symb 160 2bid consistently  02: none rec Try dymista one twice daily aimed toward ear on same side and if improve reduce to one at bedtime No other changes. If nose not better let me refer you to an allergist    07/14/2020  f/u ov/Antino Mayabb re: could not afford dymista / on disability due to asthma  Chief Complaint  Patient presents with   Follow-up    Patient is feeling good overall, no concerns  Dyspnea:   Not limited by breathing from desired activities   Cough: some assoc with pnds  Sleeping: flat bed 2 pillows  SABA use:  Once a week 02:  None  Rec Refer to allergy > not done   NP eval/recs 12/18/20 Continue on Symbicort 2 puffs Twice daily, rinse after use.  Begin Claritin  daily  Saline saline spray As needed   Albuterol inhaler As needed   Refer to allergist .    Admit date: 04/29/2021 Discharge date: 05/05/2021   Time spent: 55 minutes   Recommendations for Outpatient Follow-up:  Follow-up with Nyoka Cowden, MD in 2 weeks.  On follow-up patient will need a basic metabolic profile, magnesium level checked to follow-up on electrolytes and renal function. Follow-up with Dr. Carola Frost, orthopedics in 1 week. Follow-up with Dr.Comer, ID as scheduled on 05/14/2021.     Discharge Diagnoses:  Principal Problem:   Septic arthritis of knee, bilateral (HCC) Active Problems:   Chronic asthma, mild persistent, uncomplicated   Septic arthritis (HCC)   Hypomagnesemia   Hypokalemia     Discharge Condition: Stable and improved   Diet recommendation: Regular      Filed Weights    04/29/21 2219  Weight: 68 kg      History of present illness:  HPI per Dr. Ardelle Anton Zegarra is a 35 y.o. female with history of asthma and chronic anemia presented to the ER with complaints of bilateral knee pain.  Patient stated her right knee started hurting and swelling 2 days ago with subjective feeling of fever chills.  Left knee started swelling over the last 24 hours and at this point patient decided to come to the ER.  Denied any trauma or any IV drug abuse.  Denied any insect bites.  Has not had any previous joint swelling skin rash or any  GI symptoms.   ED Course: In the ER patient had temperature of 99.4 F and on exam patient's left knee is more swollen than the right knee.  X-rays reveal mild joint effusion.  Labs show hemoglobin of 8.6 potassium of 2.9 CRP of 0.8 and arthrocentesis was done which shows WBC count of 1300 with gram-positive cocci in gram stain.  On-call orthopedic surgeon Dr. Carola Frost was consulted patient has been started on vancomycin and ceftriaxone.  Admitted for further management.  Potassium replacement was ordered COVID test was negative.   Hospital Course:  #1 bilateral septic arthritis of the knees secondary to MRSE -Patient was  admitted with bilateral knee pain and swelling with subjective fevers and chills.  Patient denied any IV drug use. -Patient seen in consultation by orthopedics and patient underwent bilateral aspiration and arthroscopic lavage of the knees on 04/30/2022 with cultures obtained which were positive for methicillin-resistant Staphylococcus epidermis. -CT maxillofacial which was done showed poor dentition with multiple carious teeth and multifocal periapical lucencies, mild to moderate mucosal thickening within the inferior maxillary sinuses, bilaterally.  Superimposed 13 mm left maxillary sinus mucous retention cyst.  Small mucous retention cyst within the right ethmoid air cell and within a pneumatized left middle nasal turbinate. -ID was consulted and saw the patient during the hospitalization patient was initially on IV vancomycin IV Rocephin pending finalization of culture results. -Patient improved clinically and will be discharged home on IV vancomycin to complete a 2-week course of IV antibiotics with end date of 05/14/2021. -Outpatient follow-up with ID and orthopedics.  2.  Chronic iron deficiency anemia -Patient noted to be anemic during the hospitalization with hemoglobin stable at 7.4 by day of discharge. -Anemia panel obtained with a iron level of 12, ferritin of 17, TIBC of 313. -Patient remained asymptomatic. -Patient be discharged home on oral iron supplementation. -Outpatient follow-up with PCP.  3.  Hypomagnesemia/hypokalemia -Repleted. -Patient will be discharged home on oral magnesium supplementation x1 week. -Outpatient follow-up with PCP.  4.  Mild intermittent asthma -Remained stable throughout the hospitalization.    06/28/2021  f/u ov/Deylan Canterbury re: asthma   maint on symbicort  160 bid but now s/p septic arthritis rx Comer Chief Complaint  Patient presents with   Follow-up    Asthma   Dyspnea:  Not limited by breathing from desired activities  / sometimes L knee  limiting Cough: minimal not noct/ no nasty mucus  Sleeping: flat bed with several pillowers SABA use: twice a week mostly when gets too hot - "rescue" is atrovent  02: none Covid status:   vax x 2    No obvious day to day or daytime variability or assoc excess/ purulent sputum or mucus plugs or hemoptysis or cp or chest tightness, subjective wheeze or overt sinus or hb symptoms.   Sleeping  without nocturnal  or early am exacerbation  of respiratory  c/o's or need for noct saba. Also denies any obvious fluctuation of symptoms with weather or environmental changes or other aggravating or alleviating factors except as outlined above   No unusual exposure hx or h/o childhood pna or knowledge of premature birth.  Current Allergies, Complete Past Medical History, Past Surgical History, Family History, and Social History were reviewed in [employer] record.  ROS  The following are not active complaints unless bolded Hoarseness, sore throat, dysphagia, dental problems, itching, sneezing,  nasal congestion or discharge of excess mucus or purulent secretions, ear ache,   fever, chills, sweats, unintended wt loss  or wt gain, classically pleuritic or exertional cp,  orthopnea pnd or arm/hand swelling  or leg swelling, presyncope, palpitations, abdominal pain, anorexia, nausea, vomiting, diarrhea  or change in bowel habits or change in bladder habits, change in stools or change in urine, dysuria, hematuria,  rash, arthralgias, visual complaints, headache, numbness, weakness or ataxia or problems with walking or coordination,  change in mood or  memory.        Current Meds  Medication Sig   albuterol (PROVENTIL) (2.5 MG/3ML) 0.083% nebulizer solution Take 3 mLs (2.5 mg total) by nebulization every 4 (four) hours as needed for wheezing or shortness of breath.   albuterol (VENTOLIN HFA) 108 (90 Base) MCG/ACT inhaler Inhale 2 puffs into the lungs every 4 (four) hours as needed for  wheezing or shortness of breath.   budesonide-formoterol (SYMBICORT) 160-4.5 MCG/ACT inhaler Inhale 2 puffs into the lungs 2 (two) times daily.                       Objective:   Physical Exam      06/28/2021         147  07/14/2020         150  11/21/2019           153  05/07/2019         152 02/06/2019        145  11/06/2018        148  07/30/2018      143  04/17/2018          145  01/16/2018          150  04/17/2017          150    03/17/17 150 lb (68 kg)  03/07/17 150 lb 4 oz (68.2 kg)  10/09/16 162 lb 1.6 oz (73.5 kg)    Vital signs reviewed  06/28/2021  - Note at rest 02 sats  98% on RA   General appearance:    amb wf nad   HEENT : pt wearing mask not removed for exam due to covid -19 concerns.    NECK :  without JVD/Nodes/TM/ nl carotid upstrokes bilaterally   LUNGS: no acc muscle use,  Nl contour chest which is clear to A and P bilaterally without cough on insp or exp maneuvers   CV:  RRR  no s3 or murmur or increase in P2, and no edema   ABD:  soft and nontender with nl inspiratory excursion in the supine position. No bruits or organomegaly appreciated, bowel sounds nl  MS:  Nl gait/ ext warm without deformities, calf tenderness, cyanosis or clubbing No obvious joint restrictions   SKIN: warm and dry without lesions    NEURO:  alert, approp, nl sensorium with  no motor or cerebellar deficits apparent.            Assessment:

## 2021-06-28 NOTE — Patient Instructions (Signed)
Plan A = Automatic = Always=    due to infection try change Symbicort 80 Take 2 puffs first thing in am and then another 2 puffs about 12 hours later.    Work on inhaler technique:  relax and gently blow all the way out then take a nice smooth full deep breath back in, triggering the inhaler at same time you start breathing in.  Hold for up to 5 seconds if you can. Blow out thru nose. Rinse and gargle with water when done.  If mouth or throat bother you at all,  try brushing teeth/gums/tongue with arm and hammer toothpaste/ make a slurry and gargle and spit out.       Plan B = Backup (to supplement plan A, not to replace it) Only use your albuterol inhaler as a rescue medication to be used if you can't catch your breath by resting or doing a relaxed purse lip breathing pattern.  - The less you use it, the better it will work when you need it. - Ok to use the inhaler up to 2 puffs  every 4 hours if you must but call for appointment if use goes up over your usual need - Don't leave home without it !!  (think of it like the spare tire for your car)   Plan C = Crisis (instead of Plan B but only if Plan B stops working) - only use your albuterol nebulizer if you first try Plan B and it fails to help > ok to use the nebulizer up to every 4 hours but if start needing it regularly call for immediate appointment    Please schedule a follow up visit in 6 months but call sooner if needed

## 2021-06-28 NOTE — Assessment & Plan Note (Signed)
Onset in childhood - Allergy profile 04/17/2018 >  Eos 0.2 /  IgE  128 RAST pos cat > dog, dust and oak trees No response to singulair as of 06/2018 so d/c'd - Sinus CT  08/07/2018 1. Mild patchy mucosal thickening. 2. Prominent bilateral concha bullosa.> referred to ent as no cat/dog and symptoms are year round /worse outside and not suggestive of an allergic mech   - referred to ent>>>  [name] eval 11/20/18 rec flonase/ns irrigation and allegra > helps some  - 11/21/2019 rec trial of dymista one bid > could not afford it  - 07/14/2020 referred to allergy > not done, repeated 12/18/20 - 05/03/21 Sinus CT : Poor dentition with multiple carious teeth and multifocal periapical lucencies, as described.Mild-to-moderate mucosal thickening within the inferior maxillary sinuses, bilaterally  Referred to dentist/ allergy 06/28/2021 >>>

## 2021-06-30 DIAGNOSIS — Z20822 Contact with and (suspected) exposure to covid-19: Secondary | ICD-10-CM | POA: Diagnosis not present

## 2021-07-22 ENCOUNTER — Encounter: Payer: Self-pay | Admitting: Allergy

## 2021-07-22 ENCOUNTER — Ambulatory Visit (INDEPENDENT_AMBULATORY_CARE_PROVIDER_SITE_OTHER): Payer: Medicare Other | Admitting: Allergy

## 2021-07-22 ENCOUNTER — Other Ambulatory Visit: Payer: Self-pay

## 2021-07-22 VITALS — BP 144/80 | HR 106 | Temp 98.3°F | Resp 16 | Ht 66.0 in | Wt 148.8 lb

## 2021-07-22 DIAGNOSIS — J4530 Mild persistent asthma, uncomplicated: Secondary | ICD-10-CM

## 2021-07-22 DIAGNOSIS — H1013 Acute atopic conjunctivitis, bilateral: Secondary | ICD-10-CM

## 2021-07-22 DIAGNOSIS — J3089 Other allergic rhinitis: Secondary | ICD-10-CM | POA: Diagnosis not present

## 2021-07-22 DIAGNOSIS — M00062 Staphylococcal arthritis, left knee: Secondary | ICD-10-CM | POA: Diagnosis not present

## 2021-07-22 DIAGNOSIS — J329 Chronic sinusitis, unspecified: Secondary | ICD-10-CM

## 2021-07-22 NOTE — Patient Instructions (Addendum)
-  try Xyzal 30m daily.  This is a long-acting antihistamine that you might respond to.  Samples provided  -perform saline rinses to help flush out your sinuses.  Rinse kit provided.  Use distilled water or boil water and bring down to room temperature prior to use.  Perform in the shower or over the sink.  Keep your mouth open the entire process and breathe through your mouth -will avoid nasal sprays due to nose bleeds -singulair has not been effective in the past -for itchy/watery eyes can use Olopatadine 0.2% 1 drop each eye daily as needed -will obtain environmental allergy panel to see if you have develop new allergens -continue avoidance measures for dust mites, cat, dog, tree pollen, mouse -you may benefit from allergen immunotherapy (allergy shots) which is a 3-5 year process to help retrain your body to be tolerant to the above allergens so that you have less symptoms and therefore need less medication management  -will obtain immunocompetence screen to see if you immune system is working as it should to help you fight and prevent infections  -continue Symbicort as directed by Dr. WMelvyn Novas-keep follow-up with your pulmonologist, Dr. WMelvyn Novas Follow-up 3-4 months or sooner if needed

## 2021-07-22 NOTE — Progress Notes (Signed)
New Patient Note  RE: Lori Black MRN: [id number] DOB: 1986/07/25 Date of Office Visit: 07/22/2021  Primary care provider: Tanda Rockers, MD  Chief Complaint: Allergies  History of present illness: Lori Black is a 35 y.o. female presenting today for evaluation of allergic rhinitis.  She states she has tried different nasal sprays (steroid sprays, atrovent, afrin) for her nasal symptoms and they have caused nosebleeds.  Thus she does not use nose sprays.  She also has tried a lot of different allergy medications (claritin, singulair, zyrtec, allegra) and nothing helps.  She is currently taking tylenol sinus severe that she takes as needed and does feel it helps some.  Her symptoms include nasal congestion and nasal drainage, itchy/watery eyes, sneezing.  The past couple days she has not been able to smell or taste food.  She does states her son has a URI currently and she believes she is getting sick from him.  She does report getting sinus infections 2-3 times a year and she states had one several months ago that was treated with prednisone and antibiotics.   She did have environmental allergy panel done in 2019 that was positive to dust mites, cat, dog, tree pollen, mouse.    She states when she was a child she recalls getting a shot for her allergy symptoms that she thinks was a yearly occurrence agreed.  She also reports childhood eczema but this is no longer an issue.  No history of food allergy.    She has history of asthma and follows with Dr. Jerilynn Mages NP with pulmonology now every 6 months.  she uses symbicort 2 puffs twice a day.  She has use albuterol recently daily with URI symptoms as above.  When she is not sick with respiratory illness she reports using albuterol 1-2 times a week on average.  Triggers include allergens and stress that she has identified.   Over the summer she states she woke up and one knee was swollen; by next day the other knee was swollen.  She  states the knees were painful and see went to ED. she was hospitalized from 7 14-7 20.  At the time she she reported no travel, no trauma or bites, no IV drug use.  She could not think of a reason that she would have developed bilateral knee swelling and pain.  In the ED x-ray revealed mild joint effusion.  She had an arthrocentesis done that was positive for gram-positive cocci that cultured out methicillin-resistant Staph epidermidis.  She was treated with IV antibiotics.  She also had a CT sinus performed that did show poor dentition with multiple teeth caries and multifocal periapical lucencies, mild to moderate mucosal thickening within the inferior maxillary sinus bilaterally.  Left maxillary sinus mucous retention cyst as well as a small mucous retention cyst of the right ethmoid air cell and pneumatized left middle nasal turbinate.  She has been hospitalized for pneumonia once.   She has had boils that require drainage.  Otherwise denies any deep-seated infections.  She reports being uptodate with vaccines.  No family history of immunodeficiency or unexpected childhood deaths.   Review of systems: Review of Systems  Constitutional: Negative.   HENT:  Positive for congestion.   Eyes: Negative.   Respiratory:  Positive for cough.   Cardiovascular: Negative.   Gastrointestinal: Negative.   Musculoskeletal: Negative.   Skin: Negative.   Neurological: Negative.    All other systems negative unless noted above in HPI  Past medical history: Past Medical History:  Diagnosis Date   Asthma     Past surgical history: Past Surgical History:  Procedure Laterality Date   CESAREAN SECTION     KNEE ARTHROSCOPY Bilateral 04/30/2021   Procedure: ARTHROSCOPY KNEE BILATERAL;  Surgeon: Altamese Shellman, MD;  Location: [facility];  Service: Orthopedics;  Laterality: Bilateral;   WRIST ARTHROSCOPY WITH DEBRIDEMENT Right 07/16/2020   Procedure: WRIST ARTHROSCOPY WITH DEBRIDEMENT TRIANGULAR FIBRO CARTILAGE  REPAIR SOFT TISSUE WITH EXTENSOR CARPAL RADIALIS STABILIZATION ;  Surgeon: Leanora Cover, MD;  Location: [facility];  Service: Orthopedics;  Laterality: Right;   WRIST ARTHROSCOPY WITH FOVEAL TRIANGULAR FIBROCARTILAGE COMPLEX REPAIR Right 07/16/2020   Procedure: WRIST ARTHROSCOPY WITH FOVEAL TRIANGULAR FIBROCARTILAGE COMPLEX REPAIR;  Surgeon: Leanora Cover, MD;  Location: [facility];  Service: Orthopedics;  Laterality: Right;    Family history:  Family History  Problem Relation Age of Onset   Diabetes Mother     Social history: Lives in a home without carpeting with electric heating and central cooling.  Cat and dog in the home.  There is no concern for water damage, mildew in the home.  There is concern for roaches in the home and states very few induration area only.  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  She denies a smoking history.  She states she is exposed to tobacco smoke in the car.   Medication List: Current Outpatient Medications  Medication Sig Dispense Refill   albuterol (PROAIR HFA) 108 (90 Base) MCG/ACT inhaler 2 puffs every 4 hours as needed only  if your can't catch your breath 18 g 2   albuterol (PROVENTIL) (2.5 MG/3ML) 0.083% nebulizer solution Take 3 mLs (2.5 mg total) by nebulization every 4 (four) hours as needed for wheezing or shortness of breath. 150 mL 3   budesonide-formoterol (SYMBICORT) 80-4.5 MCG/ACT inhaler Take 2 puffs first thing in am and then another 2 puffs about 12 hours later. 1 each 11   No current facility-administered medications for this visit.    Known medication allergies: No Known Allergies   Physical examination: Blood pressure (!) 144/80, pulse (!) 106, temperature 98.3 F (36.8 C), temperature source Temporal, resp. rate 16, height 5' 6" (1.676 m), weight 148 lb 12.8 oz (67.5 kg), SpO2 97 %.  General: Alert, interactive, in no acute distress. HEENT: PERRLA, TMs pearly  gray, turbinates moderately edematous without discharge, post-pharynx non erythematous. Neck: Supple without lymphadenopathy. Lungs: Clear to auscultation without wheezing, rhonchi or rales. {no increased work of breathing. CV: Normal S1, S2 without murmurs. Abdomen: Nondistended, nontender. Skin: Warm and dry, without lesions or rashes. Extremities:  No clubbing, cyanosis or edema. Neuro:   Grossly intact.  Diagnositics/Labs: Labs:  Component     Latest Ref Rng & Units 04/17/2018        11:11 AM  Allergen, D pternoyssinus,d7     kU/L 12.50 (H)  Class      3  D. farinae     kU/L 12.10 (H)  Allergen, P. notatum, m1     kU/L <0.10  CLADOSPORIUM HERBARUM (M2) IGE     kU/L <0.10  Aspergillus fumigatus, m3     kU/L <0.10  Allergen, A. alternata, m6     kU/L <0.10  Cat Dander     kU/L 38.40 (H)  Dog Dander     kU/L 2.55 (H)  Cockroach     kU/L <0.10  Box Elder IgE  kU/L <0.10  Allergen, Comm Silver Wendee Copp, t9     kU/L <0.10  Allergen, Cedar tree, t12     kU/L <0.10  Allergen, Cottonwood, t14     kU/L <0.10  Allergen, Oak,t7     kU/L 4.88 (H)  Elm IgE     kU/L <0.10  Pecan/Hickory Tree IgE     kU/L <0.10  Allergen, Mulberry, t76     kU/L <0.10  [place] Grass     kU/L <0.10  Timothy Grass     kU/L <0.10  Johnson Grass     kU/L <0.10  COMMON RAGWEED (SHORT) (W1) IGE     kU/L <0.10  Rough Pigweed  IgE     kU/L <0.10  Sheep Sorrel IgE     kU/L <0.10  Allergen, Mouse Urine Protein, e78     kU/L 0.80 (H)  IgE (Immunoglobulin E), Serum     <OR=114 kU/L 158 (H)   Assessment and plan: Allergic rhinitis with conjunctivitis  -try Xyzal 93m daily.  This is a long-acting antihistamine that you might respond to.  Samples provided  -perform saline rinses to help flush out your sinuses.  Rinse kit provided.  Use distilled water or boil water and bring down to room temperature prior to use.  Perform in the shower or over the sink.  Keep your mouth open the entire  process and breathe through your mouth -will avoid nasal sprays due to nose bleeds -singulair has not been effective in the past -for itchy/watery eyes can use Olopatadine 0.2% 1 drop each eye daily as needed -will obtain environmental allergy panel to see if you have develop new allergens -continue avoidance measures for dust mites, cat, dog, tree pollen, mouse -you may benefit from allergen immunotherapy (allergy shots) which is a 3-5 year process to help retrain your body to be tolerant to the above allergens so that you have less symptoms and therefore need less medication management  Infections, recurrent -will obtain immunocompetence screen to see if you immune system is working as it should to help you fight and prevent infections  Asthma -continue Symbicort as directed by Dr. WMelvyn Novas-have access to albuterol inhaler 2 puffs every 4-6 hours as needed for cough/wheeze/shortness of breath/chest tightness.  May use 15-20 minutes prior to activity.   Monitor frequency of use.   -keep follow-up with your pulmonologist, Dr. WMelvyn Novas Follow-up 3-4 months or sooner if needed  I appreciate the opportunity to take part in Briella's care. Please do not hesitate to contact me with questions.  Sincerely,   SPrudy Feeler MD Allergy/Immunology [facility]

## 2021-08-10 LAB — ALLERGENS W/TOTAL IGE AREA 2
Alternaria Alternata IgE: <0.1 kU/L
Aspergillus Fumigatus IgE: <0.1 kU/L
Bermuda Grass IgE: <0.1 kU/L
Cat Dander IgE: 15.7 kU/L — AB
Cedar, Mountain IgE: <0.1 kU/L
Cladosporium Herbarum IgE: <0.1 kU/L
Cockroach, German IgE: 0.31 kU/L — AB
Common Silver Birch IgE: <0.1 kU/L
Cottonwood IgE: <0.1 kU/L
D Farinae IgE: 8.71 kU/L — AB
D Pteronyssinus IgE: 6.61 kU/L — AB
Dog Dander IgE: 3.34 kU/L — AB
Elm, American IgE: <0.1 kU/L
Johnson Grass IgE: <0.1 kU/L
Maple/Box Elder IgE: <0.1 kU/L
Mouse Urine IgE: 0.37 kU/L — AB
Oak, White IgE: 2.99 kU/L — AB
Pecan, Hickory IgE: <0.1 kU/L
Penicillium Chrysogen IgE: <0.1 kU/L
Pigweed, Rough IgE: <0.1 kU/L
Ragweed, Short IgE: <0.1 kU/L
Sheep Sorrel IgE Qn: <0.1 kU/L
Timothy Grass IgE: <0.1 kU/L
White Mulberry IgE: <0.1 kU/L

## 2021-08-10 LAB — DIPHTHERIA / TETANUS ANTIBODY PANEL
Diphtheria Ab: 0.21 IU/mL (ref <0.10)
Tetanus Ab, IgG: 1.1 IU/mL (ref <0.10)

## 2021-08-10 LAB — STREP PNEUMONIAE 23 SEROTYPES IGG
Pneumo Ab Type 1*: 1.8 ug/mL (ref >1.3)
Pneumo Ab Type 12 (12F)*: <0.1 ug/mL — ABNORMAL LOW (ref >1.3)
Pneumo Ab Type 14*: 18.1 ug/mL (ref >1.3)
Pneumo Ab Type 17 (17F)*: 13.4 ug/mL (ref >1.3)
Pneumo Ab Type 19 (19F)*: 4.5 ug/mL (ref >1.3)
Pneumo Ab Type 2*: 0.3 ug/mL — ABNORMAL LOW (ref >1.3)
Pneumo Ab Type 20*: 0.4 ug/mL — ABNORMAL LOW (ref >1.3)
Pneumo Ab Type 22 (22F)*: 3.2 ug/mL (ref >1.3)
Pneumo Ab Type 23 (23F)*: 1.7 ug/mL (ref >1.3)
Pneumo Ab Type 26 (6B)*: 11.2 ug/mL (ref >1.3)
Pneumo Ab Type 3*: 3.4 ug/mL (ref >1.3)
Pneumo Ab Type 34 (10A)*: 4 ug/mL (ref >1.3)
Pneumo Ab Type 4*: 0.4 ug/mL — ABNORMAL LOW (ref >1.3)
Pneumo Ab Type 43 (11A)*: 5.7 ug/mL (ref >1.3)
Pneumo Ab Type 5*: 5.3 ug/mL (ref >1.3)
Pneumo Ab Type 51 (7F)*: 2.1 ug/mL (ref >1.3)
Pneumo Ab Type 54 (15B)*: 1.9 ug/mL (ref >1.3)
Pneumo Ab Type 56 (18C)*: 1.6 ug/mL (ref >1.3)
Pneumo Ab Type 57 (19A)*: 10.9 ug/mL (ref >1.3)
Pneumo Ab Type 68 (9V)*: 4.7 ug/mL (ref >1.3)
Pneumo Ab Type 70 (33F)*: 0.5 ug/mL — ABNORMAL LOW (ref >1.3)
Pneumo Ab Type 8*: 1.4 ug/mL (ref >1.3)
Pneumo Ab Type 9 (9N)*: 1.2 ug/mL — ABNORMAL LOW (ref >1.3)

## 2021-08-10 LAB — IMMUNOGLOBULINS A/E/G/M, SERUM
IgA/Immunoglobulin A, Serum: 294 mg/dL (ref 87–352)
IgE (Immunoglobulin E), Serum: 104 IU/mL (ref 6–495)
IgG (Immunoglobin G), Serum: 1057 mg/dL (ref 586–1602)
IgM (Immunoglobulin M), Srm: 124 mg/dL (ref 26–217)

## 2021-08-10 LAB — COMPLEMENT, TOTAL: Compl, Total (CH50): 60 U/mL (ref >41)

## 2021-08-24 ENCOUNTER — Emergency Department (HOSPITAL_COMMUNITY)
Admission: EM | Admit: 2021-08-24 | Discharge: 2021-08-24 | Disposition: A | Discharge status: Home / Self Care | Payer: Medicare Other | Attending: Emergency Medicine | Admitting: Emergency Medicine

## 2021-08-24 ENCOUNTER — Emergency Department (HOSPITAL_COMMUNITY): Payer: Medicare Other

## 2021-08-24 ENCOUNTER — Other Ambulatory Visit: Payer: Self-pay

## 2021-08-24 ENCOUNTER — Encounter (HOSPITAL_COMMUNITY): Payer: Self-pay

## 2021-08-24 DIAGNOSIS — R051 Acute cough: Secondary | ICD-10-CM | POA: Diagnosis not present

## 2021-08-24 DIAGNOSIS — J441 Chronic obstructive pulmonary disease with (acute) exacerbation: Secondary | ICD-10-CM | POA: Insufficient documentation

## 2021-08-24 DIAGNOSIS — J111 Influenza due to unidentified influenza virus with other respiratory manifestations: Secondary | ICD-10-CM | POA: Diagnosis not present

## 2021-08-24 DIAGNOSIS — J45909 Unspecified asthma, uncomplicated: Secondary | ICD-10-CM | POA: Insufficient documentation

## 2021-08-24 DIAGNOSIS — Z7951 Long term (current) use of inhaled steroids: Secondary | ICD-10-CM | POA: Insufficient documentation

## 2021-08-24 DIAGNOSIS — R000 Tachycardia, unspecified: Secondary | ICD-10-CM | POA: Diagnosis not present

## 2021-08-24 DIAGNOSIS — J4530 Mild persistent asthma, uncomplicated: Secondary | ICD-10-CM | POA: Diagnosis not present

## 2021-08-24 DIAGNOSIS — J101 Influenza due to other identified influenza virus with other respiratory manifestations: Secondary | ICD-10-CM | POA: Diagnosis not present

## 2021-08-24 DIAGNOSIS — Z8616 Personal history of COVID-19: Secondary | ICD-10-CM | POA: Diagnosis not present

## 2021-08-24 DIAGNOSIS — Z20822 Contact with and (suspected) exposure to covid-19: Secondary | ICD-10-CM | POA: Insufficient documentation

## 2021-08-24 DIAGNOSIS — R059 Cough, unspecified: Secondary | ICD-10-CM | POA: Diagnosis not present

## 2021-08-24 DIAGNOSIS — R079 Chest pain, unspecified: Secondary | ICD-10-CM | POA: Diagnosis not present

## 2021-08-24 DIAGNOSIS — J4541 Moderate persistent asthma with (acute) exacerbation: Secondary | ICD-10-CM | POA: Diagnosis not present

## 2021-08-24 LAB — CBC
HCT: 31.7 % — ABNORMAL LOW (ref 36.0–46.0)
Hemoglobin: 9.5 g/dL — ABNORMAL LOW (ref 12.0–15.0)
MCH: 21 pg — ABNORMAL LOW (ref 26.0–34.0)
MCHC: 30 g/dL (ref 30.0–36.0)
MCV: 70 fL — ABNORMAL LOW (ref 80.0–100.0)
Platelets: 225 10*3/uL (ref 150–400)
RBC: 4.53 MIL/uL (ref 3.87–5.11)
RDW: 16.1 % — ABNORMAL HIGH (ref 11.5–15.5)
WBC: 7.2 10*3/uL (ref 4.0–10.5)
nRBC: 0 % (ref 0.0–0.2)

## 2021-08-24 LAB — BASIC METABOLIC PANEL
Anion gap: 8 (ref 5–15)
BUN: 10 mg/dL (ref 6–20)
CO2: 27 mmol/L (ref 22–32)
Calcium: 9.2 mg/dL (ref 8.9–10.3)
Chloride: 102 mmol/L (ref 98–111)
Creatinine, Ser: 0.69 mg/dL (ref 0.44–1.00)
GFR, Estimated: >60 mL/min (ref >60)
Glucose, Bld: 115 mg/dL — ABNORMAL HIGH (ref 70–99)
Potassium: 4.6 mmol/L (ref 3.5–5.1)
Sodium: 137 mmol/L (ref 135–145)

## 2021-08-24 LAB — RESP PANEL BY RT-PCR (FLU A&B, COVID) ARPGX2
Influenza A by PCR: POSITIVE — AB
Influenza B by PCR: NEGATIVE
SARS Coronavirus 2 by RT PCR: NEGATIVE

## 2021-08-24 MED ORDER — PREDNISONE 20 MG PO TABS
60.0000 mg | ORAL_TABLET | Freq: Once | ORAL | Status: AC
  Administered 2021-08-24: 60 mg via ORAL
  Filled 2021-08-24: qty 3

## 2021-08-24 MED ORDER — ACETAMINOPHEN 500 MG PO TABS
1000.0000 mg | ORAL_TABLET | Freq: Once | ORAL | Status: AC
  Administered 2021-08-24: 1000 mg via ORAL
  Filled 2021-08-24: qty 2

## 2021-08-24 MED ORDER — ALBUTEROL SULFATE (2.5 MG/3ML) 0.083% IN NEBU
5.0000 mg | INHALATION_SOLUTION | Freq: Once | RESPIRATORY_TRACT | Status: AC
  Administered 2021-08-24: 5 mg via RESPIRATORY_TRACT
  Filled 2021-08-24: qty 6

## 2021-08-24 MED ORDER — PREDNISONE 20 MG PO TABS: 60.0000 mg | ORAL_TABLET | Freq: Every day | ORAL | 0 refills | Status: DC

## 2021-08-24 MED ORDER — IPRATROPIUM BROMIDE 0.02 % IN SOLN
0.5000 mg | Freq: Once | RESPIRATORY_TRACT | Status: AC
  Administered 2021-08-24: 0.5 mg via RESPIRATORY_TRACT
  Filled 2021-08-24: qty 2.5

## 2021-08-24 NOTE — ED Triage Notes (Addendum)
Pt c/o chest pain, sob, and cough over the past couple days. States she believes her asthma has been acting up. Tried nebs at home with minimal relief. Airway patent. No respiratory distress noted. Reports she has had to be intubated twice in the past due to asthma exacerbations

## 2021-08-24 NOTE — Discharge Instructions (Addendum)
It was our pleasure to provide your ER care today - we hope that you feel better.  Drink plenty of fluids/stay well hydrated. Take acetaminophen as need.   Take prednisone as prescribed. Use albuterol treatment every 3-4 hours as need. Avoid smoking environments, and your environmental allergens.   Follow up with primary care doctor in one week if symptoms fail to improve/resolve.  Return to ER if worse, new symptoms, increased trouble breathing, or other concern.

## 2021-08-24 NOTE — ED Provider Notes (Addendum)
[facility] DEPT Provider Note   CSN: [id number] Arrival date & time: 08/24/21  1512     History Chief Complaint  Patient presents with   Shortness of Breath   Cough   Chest Pain    Lori Black is a 35 y.o. female.  Pt w hx asthma, c/o increased non prod cough, and wheezing/sob, in the pats few days. Symptoms acute onset, moderate, recurrent, persistent. Used breathing tx x 2 today. No current oral steroids. No recent antibiotic use. No specific known ill contacts. No sore throat or nasal/sinus pain. No headache. No fever or chills. Mid chest soreness w coughing episodes, other no exertional chest pain, no constant and/or pleuritic chest pain. No leg pain or swelling. No recent surgery, trauma, travel or immobility. No hemoptysis, cough is non productive. Non smoker.   The history is provided by the patient and medical records.  Shortness of Breath Associated symptoms: chest pain, cough and wheezing   Associated symptoms: no abdominal pain, no fever, no headaches, no neck pain, no rash, no sore throat and no vomiting   Cough Associated symptoms: chest pain, shortness of breath and wheezing   Associated symptoms: no chills, no fever, no headaches, no rash and no sore throat   Chest Pain Associated symptoms: cough and shortness of breath   Associated symptoms: no abdominal pain, no back pain, no fever, no headache and no vomiting       Past Medical History:  Diagnosis Date   Asthma     Patient Active Problem List   Diagnosis Date Noted   Medication monitoring encounter 05/14/2021   Hypomagnesemia 05/05/2021   Hypokalemia 05/05/2021   Septic arthritis of knee, bilateral ([place]) 04/30/2021   Septic arthritis ([place]) 04/30/2021   Rhinitis, chronic 07/31/2018   Chest pain, musculoskeletal 07/19/2017   Chronic asthma, mild persistent, uncomplicated [id number]   Asthma with acute exacerbation 10/11/2016   Microcytic anemia 10/11/2016   Influenza A  10/11/2016   Acute respiratory failure ([place]) 10/09/2016    Past Surgical History:  Procedure Laterality Date   CESAREAN SECTION     KNEE ARTHROSCOPY Bilateral 04/30/2021   Procedure: ARTHROSCOPY KNEE BILATERAL;  Surgeon: Altamese Taft [place], MD;  Location: [facility];  Service: Orthopedics;  Laterality: Bilateral;   WRIST ARTHROSCOPY WITH DEBRIDEMENT Right 07/16/2020   Procedure: WRIST ARTHROSCOPY WITH DEBRIDEMENT TRIANGULAR FIBRO CARTILAGE REPAIR SOFT TISSUE WITH EXTENSOR CARPAL RADIALIS STABILIZATION ;  Surgeon: Leanora Cover, MD;  Location: [facility];  Service: Orthopedics;  Laterality: Right;   WRIST ARTHROSCOPY WITH FOVEAL TRIANGULAR FIBROCARTILAGE COMPLEX REPAIR Right 07/16/2020   Procedure: WRIST ARTHROSCOPY WITH FOVEAL TRIANGULAR FIBROCARTILAGE COMPLEX REPAIR;  Surgeon: Leanora Cover, MD;  Location: [facility];  Service: Orthopedics;  Laterality: Right;     OB History   No obstetric history on file.     Family History  Problem Relation Age of Onset   Diabetes Mother     Social History   Tobacco Use   Smoking status: Never   Smokeless tobacco: Never  Vaping Use   Vaping Use: Never used  Substance Use Topics   Alcohol use: Yes    Comment: occassionally   Drug use: No    Home Medications Prior to Admission medications   Medication Sig Start Date End Date Taking? Authorizing Provider  albuterol (PROAIR HFA) 108 (90 Base) MCG/ACT inhaler 2 puffs every 4 hours as needed only  if your can't catch your breath 06/28/21   Tanda Rockers, MD  albuterol (PROVENTIL) (2.5 MG/3ML) 0.083% nebulizer solution Take 3 mLs (2.5 mg total) by nebulization every 4 (four) hours as needed for wheezing or shortness of breath. 12/18/20   Parrett, Fonnie Mu, NP  budesonide-formoterol (SYMBICORT) 80-4.5 MCG/ACT inhaler Take 2 puffs first thing in am and then another 2 puffs about 12 hours later. 06/28/21   Tanda Rockers, MD    Allergies    Patient has no known  allergies.  Review of Systems   Review of Systems  Constitutional:  Negative for chills and fever.  HENT:  Negative for sore throat.   Eyes:  Negative for redness.  Respiratory:  Positive for cough, shortness of breath and wheezing.   Cardiovascular:  Positive for chest pain.  Gastrointestinal:  Negative for abdominal pain and vomiting.  Genitourinary:  Negative for flank pain.  Musculoskeletal:  Negative for back pain and neck pain.  Skin:  Negative for rash.  Neurological:  Negative for headaches.  Hematological:  Does not bruise/bleed easily.  Psychiatric/Behavioral:  Negative for confusion.    Physical Exam Updated Vital Signs BP 119/65   Pulse (!) 121   Temp 98 F (36.7 C) (Oral)   Resp 20   Ht 1.651 m (5\' 5" )   Wt 67.1 kg   SpO2 100%   BMI 24.63 kg/m   Physical Exam Vitals and nursing note reviewed.  Constitutional:      Appearance: Normal appearance. She is well-developed.  HENT:     Head: Atraumatic.     Nose: Nose normal.     Mouth/Throat:     Mouth: Mucous membranes are moist.     Pharynx: Oropharynx is clear.  Eyes:     General: No scleral icterus.    Conjunctiva/sclera: Conjunctivae normal.  Neck:     Trachea: No tracheal deviation.  Cardiovascular:     Rate and Rhythm: Regular rhythm. Tachycardia present.     Pulses: Normal pulses.     Heart sounds: Normal heart sounds. No murmur heard.   No friction rub. No gallop.  Pulmonary:     Effort: Pulmonary effort is normal. No respiratory distress.     Breath sounds: Wheezing present.     Comments: +non prod cough. Mild anterior chest wall tenderness. Normal chest movement/excursion.  Abdominal:     General: Bowel sounds are normal. There is no distension.     Palpations: Abdomen is soft.     Tenderness: There is no abdominal tenderness. There is no guarding.  Genitourinary:    Comments: No cva tenderness.  Musculoskeletal:        General: No swelling or tenderness.     Cervical back: Normal range  of motion and neck supple. No rigidity. No muscular tenderness.     Right lower leg: No edema.     Left lower leg: No edema.  Skin:    General: Skin is warm and dry.     Findings: No rash.  Neurological:     Mental Status: She is alert.     Comments: Alert, speech normal.   Psychiatric:        Mood and Affect: Mood normal.    ED Results / Procedures / Treatments   Labs (all labs ordered are listed, but only abnormal results are displayed) Results for orders placed or performed during the hospital encounter of 08/24/21  Resp Panel by RT-PCR (Flu A&B, Covid) Nasopharyngeal Swab   Specimen: Nasopharyngeal Swab; Nasopharyngeal(NP) swabs in vial transport medium  Result Value Ref Range   SARS  Coronavirus 2 by RT PCR NEGATIVE NEGATIVE   Influenza A by PCR POSITIVE (A) NEGATIVE   Influenza B by PCR NEGATIVE NEGATIVE  Basic metabolic panel  Result Value Ref Range   Sodium 137 135 - 145 mmol/L   Potassium 4.6 3.5 - 5.1 mmol/L   Chloride 102 98 - 111 mmol/L   CO2 27 22 - 32 mmol/L   Glucose, Bld 115 (H) 70 - 99 mg/dL   BUN 10 6 - 20 mg/dL   Creatinine, Ser 3.71 0.44 - 1.00 mg/dL   Calcium 9.2 8.9 - 69.6 mg/dL   GFR, Estimated >78 >93 mL/min   Anion gap 8 5 - 15  CBC  Result Value Ref Range   WBC 7.2 4.0 - 10.5 K/uL   RBC 4.53 3.87 - 5.11 MIL/uL   Hemoglobin 9.5 (L) 12.0 - 15.0 g/dL   HCT 81.0 (L) 17.5 - 10.2 %   MCV 70.0 (L) 80.0 - 100.0 fL   MCH 21.0 (L) 26.0 - 34.0 pg   MCHC 30.0 30.0 - 36.0 g/dL   RDW 58.5 (H) 27.7 - 82.4 %   Platelets 225 150 - 400 K/uL   nRBC 0.0 0.0 - 0.2 %      EKG EKG Interpretation  Date/Time:  Tuesday August 24 2021 15:22:00 EST Ventricular Rate:  111 PR Interval:  144 QRS Duration: 90 QT Interval:  310 QTC Calculation: 422 R Axis:   267 Text Interpretation: Sinus tachycardia Non-specific ST-t changes Confirmed by Cathren Laine ([id number]) on 08/24/2021 3:37:15 PM  Radiology DG Chest 2 View  Result Date: 08/24/2021 CLINICAL DATA:   Provided history: Shortness of breath. Additional history provided: Chest pain radiating into bilateral shoulders, dry cough. History of asthma, COVID infection last year. EXAM: CHEST - 2 VIEW COMPARISON:  Prior chest radiographs 01/10/2021 and earlier. FINDINGS: Heart size within normal limits. Incidentally noted azygos fissure. No appreciable airspace consolidation or pulmonary edema. No evidence of pleural effusion or pneumothorax. No acute bony abnormality identified. IMPRESSION: No evidence of acute cardiopulmonary abnormality. Electronically Signed   By: Jackey Loge D.O.   On: 08/24/2021 15:44    Procedures Procedures   Medications Ordered in ED Medications  albuterol (PROVENTIL) (2.5 MG/3ML) 0.083% nebulizer solution 5 mg (has no administration in time range)  ipratropium (ATROVENT) nebulizer solution 0.5 mg (has no administration in time range)  predniSONE (DELTASONE) tablet 60 mg (has no administration in time range)    ED Course  I have reviewed the triage vital signs and the nursing notes.  Pertinent labs & imaging results that were available during my care of the patient were reviewed by me and considered in my medical decision making (see chart for details).    MDM Rules/Calculators/A&P                          Labs. Imaging.  Albuterol and atrovent tx. Prednisone po.   Reviewed nursing notes and prior charts for additional history.   Labs reviewed/interpreted by me - flu positive.  CXR reviewed/interpreted by me - no pna.   Recheck, persistent wheezing. Additional albuterol/atrovent tx.   Recheck, wheezing improved.   Acetaminophen po.  Pt is breathing comfortably. No acute distress.   Po fluids/food. Currently hr 106, rr 16. Breathing comfortably. Sats 100%. Pt indicates has adequate albuterol mdi and neb soln at home.   Pt currently appears stable for d/c.      Final Clinical Impression(s) / ED Diagnoses Final diagnoses:  None    Rx / DC Orders ED  Discharge Orders     None           Lajean Saver, MD 08/24/21 1753

## 2021-08-27 ENCOUNTER — Other Ambulatory Visit: Payer: Self-pay

## 2021-08-27 ENCOUNTER — Encounter (HOSPITAL_COMMUNITY): Payer: Self-pay | Admitting: Emergency Medicine

## 2021-08-27 ENCOUNTER — Emergency Department (HOSPITAL_COMMUNITY): Payer: Medicare Other

## 2021-08-27 ENCOUNTER — Emergency Department (HOSPITAL_COMMUNITY)
Admission: EM | Admit: 2021-08-27 | Discharge: 2021-08-28 | Disposition: A | Discharge status: Home / Self Care | Payer: Medicare Other | Attending: Emergency Medicine | Admitting: Emergency Medicine

## 2021-08-27 DIAGNOSIS — R0600 Dyspnea, unspecified: Secondary | ICD-10-CM | POA: Diagnosis not present

## 2021-08-27 DIAGNOSIS — R000 Tachycardia, unspecified: Secondary | ICD-10-CM | POA: Diagnosis not present

## 2021-08-27 DIAGNOSIS — J40 Bronchitis, not specified as acute or chronic: Secondary | ICD-10-CM | POA: Insufficient documentation

## 2021-08-27 DIAGNOSIS — R079 Chest pain, unspecified: Secondary | ICD-10-CM | POA: Diagnosis not present

## 2021-08-27 DIAGNOSIS — Z7951 Long term (current) use of inhaled steroids: Secondary | ICD-10-CM | POA: Insufficient documentation

## 2021-08-27 DIAGNOSIS — D649 Anemia, unspecified: Secondary | ICD-10-CM | POA: Diagnosis not present

## 2021-08-27 DIAGNOSIS — J111 Influenza due to unidentified influenza virus with other respiratory manifestations: Secondary | ICD-10-CM | POA: Insufficient documentation

## 2021-08-27 DIAGNOSIS — R6889 Other general symptoms and signs: Secondary | ICD-10-CM | POA: Diagnosis not present

## 2021-08-27 DIAGNOSIS — R911 Solitary pulmonary nodule: Secondary | ICD-10-CM | POA: Diagnosis not present

## 2021-08-27 DIAGNOSIS — E041 Nontoxic single thyroid nodule: Secondary | ICD-10-CM | POA: Diagnosis not present

## 2021-08-27 DIAGNOSIS — R0602 Shortness of breath: Secondary | ICD-10-CM | POA: Diagnosis not present

## 2021-08-27 DIAGNOSIS — E876 Hypokalemia: Secondary | ICD-10-CM | POA: Insufficient documentation

## 2021-08-27 DIAGNOSIS — J4530 Mild persistent asthma, uncomplicated: Secondary | ICD-10-CM | POA: Diagnosis not present

## 2021-08-27 DIAGNOSIS — R059 Cough, unspecified: Secondary | ICD-10-CM | POA: Diagnosis not present

## 2021-08-27 LAB — CBC WITH DIFFERENTIAL/PLATELET
Abs Immature Granulocytes: 0.02 10*3/uL (ref 0.00–0.07)
Basophils Absolute: 0 10*3/uL (ref 0.0–0.1)
Basophils Relative: 0 %
Eosinophils Absolute: 0 10*3/uL (ref 0.0–0.5)
Eosinophils Relative: 0 %
HCT: 28.4 % — ABNORMAL LOW (ref 36.0–46.0)
Hemoglobin: 8.7 g/dL — ABNORMAL LOW (ref 12.0–15.0)
Immature Granulocytes: 0 %
Lymphocytes Relative: 5 %
Lymphs Abs: 0.3 10*3/uL — ABNORMAL LOW (ref 0.7–4.0)
MCH: 21.1 pg — ABNORMAL LOW (ref 26.0–34.0)
MCHC: 30.6 g/dL (ref 30.0–36.0)
MCV: 68.8 fL — ABNORMAL LOW (ref 80.0–100.0)
Monocytes Absolute: 0.3 10*3/uL (ref 0.1–1.0)
Monocytes Relative: 4 %
Neutro Abs: 6 10*3/uL (ref 1.7–7.7)
Neutrophils Relative %: 91 %
Platelets: 276 10*3/uL (ref 150–400)
RBC: 4.13 MIL/uL (ref 3.87–5.11)
RDW: 16.4 % — ABNORMAL HIGH (ref 11.5–15.5)
WBC: 6.6 10*3/uL (ref 4.0–10.5)
nRBC: 0 % (ref 0.0–0.2)

## 2021-08-27 LAB — COMPREHENSIVE METABOLIC PANEL
ALT: 17 U/L (ref 0–44)
AST: 21 U/L (ref 15–41)
Albumin: 3.9 g/dL (ref 3.5–5.0)
Alkaline Phosphatase: 59 U/L (ref 38–126)
Anion gap: 11 (ref 5–15)
BUN: 14 mg/dL (ref 6–20)
CO2: 28 mmol/L (ref 22–32)
Calcium: 8.6 mg/dL — ABNORMAL LOW (ref 8.9–10.3)
Chloride: 99 mmol/L (ref 98–111)
Creatinine, Ser: 0.83 mg/dL (ref 0.44–1.00)
GFR, Estimated: >60 mL/min (ref >60)
Glucose, Bld: 281 mg/dL — ABNORMAL HIGH (ref 70–99)
Potassium: 2.9 mmol/L — ABNORMAL LOW (ref 3.5–5.1)
Sodium: 138 mmol/L (ref 135–145)
Total Bilirubin: 0.6 mg/dL (ref 0.3–1.2)
Total Protein: 7 g/dL (ref 6.5–8.1)

## 2021-08-27 LAB — I-STAT BETA HCG BLOOD, ED (MC, WL, AP ONLY): I-stat hCG, quantitative: <5 m[IU]/mL (ref <5)

## 2021-08-27 LAB — TROPONIN I (HIGH SENSITIVITY): Troponin I (High Sensitivity): 2 ng/L (ref <18)

## 2021-08-27 LAB — LACTIC ACID, PLASMA: Lactic Acid, Venous: 3.4 mmol/L (ref 0.5–1.9)

## 2021-08-27 MED ORDER — LACTATED RINGERS IV BOLUS
1000.0000 mL | Freq: Once | INTRAVENOUS | Status: AC
  Administered 2021-08-27: 1000 mL via INTRAVENOUS

## 2021-08-27 MED ORDER — LACTATED RINGERS IV BOLUS
1000.0000 mL | Freq: Once | INTRAVENOUS | Status: AC
  Administered 2021-08-28: 1000 mL via INTRAVENOUS

## 2021-08-27 MED ORDER — METHYLPREDNISOLONE SODIUM SUCC 125 MG IJ SOLR
125.0000 mg | Freq: Once | INTRAMUSCULAR | Status: AC
  Administered 2021-08-27: 125 mg via INTRAVENOUS
  Filled 2021-08-27: qty 2

## 2021-08-27 MED ORDER — POTASSIUM CHLORIDE 10 MEQ/100ML IV SOLN
10.0000 meq | INTRAVENOUS | Status: AC
  Administered 2021-08-28: 10 meq via INTRAVENOUS
  Filled 2021-08-27 (×2): qty 100

## 2021-08-27 MED ORDER — IPRATROPIUM-ALBUTEROL 0.5-2.5 (3) MG/3ML IN SOLN
3.0000 mL | Freq: Once | RESPIRATORY_TRACT | Status: AC
  Administered 2021-08-27: 3 mL via RESPIRATORY_TRACT
  Filled 2021-08-27: qty 3

## 2021-08-27 NOTE — ED Triage Notes (Signed)
Patient arrives complaining of continuing chest pain and shortness of breath. Patient was diagnosed with the flu x3 days ago. Patient has been taking prescribed medications and using albuterol treatments at home without help. Patient noted to be 89-95% RA, which she endorses at home also. Patient complaining of left chest pain and cough with thick, yellow sputum.

## 2021-08-27 NOTE — ED Notes (Signed)
X-ray at bedside

## 2021-08-27 NOTE — ED Provider Notes (Signed)
[place] DEPT Provider Note   CSN: [id number] Arrival date & time: 08/27/21  2051     History Chief Complaint  Patient presents with   Chest Pain   Shortness of Breath    Lori Black is a 35 y.o. female.  HPI     35 year old female with a history of bilateral knee septic arthritis with surgery in July, asthma, presents with concern for shortness of breath and chest pain.  Reports that her symptoms began 3 or 4 days ago with shortness of breath, cough and chest pain.  She came to the emergency department, was diagnosed with influenza A, and prescribed prednisone.  Reports that she has been taking the prednisone as prescribed, but her breathing has continued to get worse.  Reports worsening shortness of breath at home, and reports only temporary improvement with her home nebulizers.  She is feeling more more like she cannot catch her breath.  Reports that she has sharp chest pain that is worse when she takes deep breaths. Low grade fevers 99s.  No n/v/diarrhea     Past Medical History:  Diagnosis Date   Asthma     Patient Active Problem List   Diagnosis Date Noted   Medication monitoring encounter 05/14/2021   Hypomagnesemia 05/05/2021   Hypokalemia 05/05/2021   Septic arthritis of knee, bilateral ([place]) 04/30/2021   Septic arthritis ([place]) 04/30/2021   Rhinitis, chronic 07/31/2018   Chest pain, musculoskeletal 07/19/2017   Chronic asthma, mild persistent, uncomplicated [id number]   Asthma with acute exacerbation 10/11/2016   Microcytic anemia 10/11/2016   Influenza A 10/11/2016   Acute respiratory failure ([place]) 10/09/2016    Past Surgical History:  Procedure Laterality Date   CESAREAN SECTION     KNEE ARTHROSCOPY Bilateral 04/30/2021   Procedure: ARTHROSCOPY KNEE BILATERAL;  Surgeon: Altamese Buena Vista, MD;  Location: [facility];  Service: Orthopedics;  Laterality: Bilateral;   WRIST ARTHROSCOPY WITH DEBRIDEMENT Right 07/16/2020   Procedure:  WRIST ARTHROSCOPY WITH DEBRIDEMENT TRIANGULAR FIBRO CARTILAGE REPAIR SOFT TISSUE WITH EXTENSOR CARPAL RADIALIS STABILIZATION ;  Surgeon: Leanora Cover, MD;  Location: [facility];  Service: Orthopedics;  Laterality: Right;   WRIST ARTHROSCOPY WITH FOVEAL TRIANGULAR FIBROCARTILAGE COMPLEX REPAIR Right 07/16/2020   Procedure: WRIST ARTHROSCOPY WITH FOVEAL TRIANGULAR FIBROCARTILAGE COMPLEX REPAIR;  Surgeon: Leanora Cover, MD;  Location: [facility];  Service: Orthopedics;  Laterality: Right;     OB History   No obstetric history on file.     Family History  Problem Relation Age of Onset   Diabetes Mother     Social History   Tobacco Use   Smoking status: Never   Smokeless tobacco: Never  Vaping Use   Vaping Use: Never used  Substance Use Topics   Alcohol use: Yes    Comment: occassionally   Drug use: No    Home Medications Prior to Admission medications   Medication Sig Start Date End Date Taking? Authorizing Provider  doxycycline (VIBRAMYCIN) 100 MG capsule Take 1 capsule (100 mg total) by mouth 2 (two) times daily. One po bid x 7 days 08/28/21  Yes Palumbo, April, MD  albuterol [facility] HFA) 108 (90 Base) MCG/ACT inhaler 2 puffs every 4 hours as needed only  if your can't catch your breath 06/28/21   Tanda Rockers, MD  albuterol (PROVENTIL) (2.5 MG/3ML) 0.083% nebulizer solution Take 3 mLs (2.5 mg total) by nebulization every 4 (four) hours as needed for wheezing or shortness of breath. 12/18/20   Parrett,  Tammy S, NP  budesonide-formoterol (SYMBICORT) 80-4.5 MCG/ACT inhaler Take 2 puffs first thing in am and then another 2 puffs about 12 hours later. 06/28/21   Tanda Rockers, MD  predniSONE (DELTASONE) 20 MG tablet Take 3 tablets (60 mg total) by mouth daily. 08/25/21   Lajean Saver, MD    Allergies    Patient has no known allergies.  Review of Systems   Review of Systems  Constitutional:  Positive for appetite change, fatigue and fever.  Eyes:   Negative for visual disturbance.  Respiratory:  Positive for cough, shortness of breath and wheezing.   Cardiovascular:  Positive for chest pain.  Gastrointestinal:  Negative for abdominal pain, nausea and vomiting.  Genitourinary:  Negative for difficulty urinating.  Musculoskeletal:  Negative for back pain and neck pain.  Skin:  Negative for rash.  Neurological:  Negative for syncope and headaches.   Physical Exam Updated Vital Signs BP 133/74   Pulse 63   Temp 98.6 F (37 C) (Oral)   Resp 18   Ht 5\' 5"  (1.651 m)   Wt 67.1 kg   SpO2 95%   BMI 24.63 kg/m   Physical Exam Vitals and nursing note reviewed.  Constitutional:      General: She is not in acute distress.    Appearance: She is well-developed. She is not diaphoretic.  HENT:     Head: Normocephalic and atraumatic.  Eyes:     Conjunctiva/sclera: Conjunctivae normal.  Cardiovascular:     Rate and Rhythm: Regular rhythm. Tachycardia present.     Heart sounds: Normal heart sounds. No murmur heard.   No friction rub. No gallop.  Pulmonary:     Effort: Pulmonary effort is normal. Tachypnea present. No respiratory distress.     Breath sounds: Wheezing present. No rales.  Abdominal:     General: There is no distension.     Palpations: Abdomen is soft.     Tenderness: There is no abdominal tenderness. There is no guarding.  Musculoskeletal:        General: No tenderness.     Cervical back: Normal range of motion.  Skin:    General: Skin is warm and dry.     Findings: No erythema or rash.  Neurological:     Mental Status: She is alert and oriented to person, place, and time.    ED Results / Procedures / Treatments   Labs (all labs ordered are listed, but only abnormal results are displayed) Labs Reviewed  CBC WITH DIFFERENTIAL/PLATELET - Abnormal; Notable for the following components:      Result Value   Hemoglobin 8.7 (*)    HCT 28.4 (*)    MCV 68.8 (*)    MCH 21.1 (*)    RDW 16.4 (*)    Lymphs Abs 0.3 (*)     All other components within normal limits  COMPREHENSIVE METABOLIC PANEL - Abnormal; Notable for the following components:   Potassium 2.9 (*)    Glucose, Bld 281 (*)    Calcium 8.6 (*)    All other components within normal limits  LACTIC ACID, PLASMA - Abnormal; Notable for the following components:   Lactic Acid, Venous 3.4 (*)    All other components within normal limits  LACTIC ACID, PLASMA - Abnormal; Notable for the following components:   Lactic Acid, Venous 3.4 (*)    All other components within normal limits  I-STAT BETA HCG BLOOD, ED ([facility], [facility], [facility] ONLY)  TROPONIN I (HIGH SENSITIVITY)  TROPONIN  I (HIGH SENSITIVITY)    EKG EKG Interpretation  Date/Time:  Friday August 27 2021 21:01:08 EST Ventricular Rate:  124 PR Interval:  137 QRS Duration: 93 QT Interval:  306 QTC Calculation: 440 R Axis:   -89 Text Interpretation: Sinus tachycardia Inferior infarct, old Probable anterior infarct, age indeterminate No significant change since last tracing Confirmed by Gareth Morgan [phone]) on 08/27/2021 10:57:54 PM  Radiology CT Angio Chest PE W and/or Wo Contrast  Result Date: 08/28/2021 CLINICAL DATA:  Chest pain, dyspnea.  PE suspected, high prob EXAM: CT ANGIOGRAPHY CHEST WITH CONTRAST TECHNIQUE: Multidetector CT imaging of the chest was performed using the standard protocol during bolus administration of intravenous contrast. Multiplanar CT image reconstructions and MIPs were obtained to evaluate the vascular anatomy. CONTRAST:  71mL OMNIPAQUE IOHEXOL 350 MG/ML SOLN COMPARISON:  None. FINDINGS: Cardiovascular: Adequate opacification of the pulmonary arterial tree. No intraluminal filling defect identified to suggest acute pulmonary embolism. Central pulmonary arteries are of normal caliber. No significant coronary artery calcification. Cardiac size within normal limits. No pericardial effusion. No significant atherosclerotic calcification within the thoracic aorta. No aortic  aneurysm. Mediastinum/Nodes: 9 mm right thyroid nodule. The visualized thyroid gland is otherwise unremarkable. Shotty bilateral hilar adenopathy may be reactive in nature. No frankly pathologic thoracic adenopathy identified. Small epiphrenic esophageal diverticulum noted. The esophagus is otherwise unremarkable. Lungs/Pleura: There is moderate to severe bronchial wall thickening noted centrally, with scattered areas of mild airway impaction within the peripheral lung bases bilaterally in keeping with airway inflammation. No superimposed focal pulmonary infiltrates. 4 mm noncalcified pulmonary nodule noted within the right lower lobe, axial image # 84/6. No pneumothorax or pleural effusion. No central obstructing mass. Note made of an azygous fissure, a normal anatomic variant. Upper Abdomen: No acute abnormality. Musculoskeletal: No chest wall abnormality. No acute or significant osseous findings. Review of the MIP images confirms the above findings. IMPRESSION: No pulmonary embolism. Moderate to severe bronchial wall thickening diffusely and scattered areas of peripheral airway impaction within the lung bases in keeping with airway inflammation. Shotty bilateral hilar adenopathy may be reactive in nature. 4 mm right solid pulmonary nodule. No routine follow-up imaging is recommended per Fleischner Society Guidelines. These guidelines do not apply to immunocompromised patients and patients with cancer. Follow up in patients with significant comorbidities as clinically warranted. For lung cancer screening, adhere to Lung-RADS guidelines. Reference: Radiology. 2017; 284(1):228-43. 4 mm right solid pulmonary nodule. No routine follow-up imaging is recommended per Fleischner Society Guidelines. These guidelines do not apply to immunocompromised patients and patients with cancer. Follow up in patients with significant comorbidities as clinically warranted. For lung cancer screening, adhere to Lung-RADS guidelines.  Reference: Radiology. 2017; 284(1):228-43. Subcentimeter incidental right thyroid nodule. No follow-up imaging is recommended. Reference: J Am Coll Radiol. 2015 Feb;12(2): 143-50 Small epiphrenic esophageal diverticulum. Electronically Signed   By: Fidela Salisbury M.D.   On: 08/28/2021 01:24   DG Chest Portable 1 View  Result Date: 08/27/2021 CLINICAL DATA:  Cough EXAM: PORTABLE CHEST 1 VIEW COMPARISON:  Chest x-ray 08/24/2021 FINDINGS: The heart and mediastinal contours are within normal limits. No focal consolidation. No pulmonary edema. No pleural effusion. No pneumothorax. No acute osseous abnormality. IMPRESSION: No active disease. Electronically Signed   By: Iven Finn M.D.   On: 08/27/2021 21:45    Procedures Procedures   Medications Ordered in ED Medications  potassium chloride 10 mEq in 100 mL IVPB (0 mEq Intravenous Stopped 08/28/21 0248)  lactated ringers bolus 1,000 mL (0 mLs  Intravenous Stopped 08/28/21 0039)  ipratropium-albuterol (DUONEB) 0.5-2.5 (3) MG/3ML nebulizer solution 3 mL (3 mLs Nebulization Given 08/27/21 2325)  methylPREDNISolone sodium succinate (SOLU-MEDROL) 125 mg/2 mL injection 125 mg (125 mg Intravenous Given 08/27/21 2324)  lactated ringers bolus 1,000 mL (0 mLs Intravenous Stopped 08/28/21 0242)  potassium chloride SA (KLOR-CON) CR tablet 40 mEq (40 mEq Oral Given 08/28/21 0136)  iohexol (OMNIPAQUE) 350 MG/ML injection 80 mL (80 mLs Intravenous Contrast Given 08/28/21 0112)    ED Course  I have reviewed the triage vital signs and the nursing notes.  Pertinent labs & imaging results that were available during my care of the patient were reviewed by me and considered in my medical decision making (see chart for details).    MDM Rules/Calculators/A&P                            35 year old female with a history of bilateral knee septic arthritis with surgery in July, asthma, presents with concern for shortness of breath and chest pain diagnosed with  the flu.  Being treated with prednisone and feels worsening dyspnea.  EKG with sinus tachycardia. CXR without pneumonia.  Given tachycardia, pleuritic chest pain, persistent dyspnea, ordered CT PE study for further evaluation for PE or occult pneumonia.  Lactic acid likely from respiratory etiology, consider sepsis if signs of pneumonia on CT.  Troponin negative no ACS or myocarditis. Anemia stable. Hypokalemia given K. GIven solumedrol, duoneb. Signed out to Dr. Nicanor Alcon with CT and reevaluation pending.    Final Clinical Impression(s) / ED Diagnoses Final diagnoses:  Anemia, unspecified type  Flu  Bronchitis  Hypokalemia    Rx / DC Orders ED Discharge Orders          Ordered    doxycycline (VIBRAMYCIN) 100 MG capsule  2 times daily        08/28/21 0232             Alvira Monday, MD 08/28/21 1252

## 2021-08-27 NOTE — ED Provider Notes (Signed)
Emergency Medicine Provider Triage Evaluation Note  Lori Black , a 35 y.o. female  was evaluated in triage.  Pt complains of cough and shortness of breath.  Pt reports she was seen and diagnosed with influenza 3 days ago.  Pt reports increased shortness of breath today.  Pt is on prednisone and albuterol   Review of Systems  Positive: Cough and fever  Negative: No vomiting  Physical Exam  BP (!) 168/96 (BP Location: Left Arm)   Pulse (!) 129   Temp 98.6 F (37 C) (Oral)   Resp 20   Ht 5\' 5"  (1.651 m)   Wt 67.1 kg   SpO2 95%   BMI 24.63 kg/m  Gen:   Awake, no distress   Resp:  Normal effort  MSK:   Moves extremities without difficulty  Other:  Lung harsh breath sounds no wheezing  Medical Decision Making  Medically screening exam initiated at 9:33 PM.  Appropriate orders placed.  Lori Black was informed that the remainder of the evaluation will be completed by another provider, this initial triage assessment does not replace that evaluation, and the importance of remaining in the ED until their evaluation is complete.  Chest xray ordered    08/27/21 2135    2136, MD 08/30/21 [phone]

## 2021-08-27 NOTE — ED Notes (Signed)
Critical Lactic acid 3.4

## 2021-08-28 ENCOUNTER — Emergency Department (HOSPITAL_COMMUNITY): Payer: Medicare Other

## 2021-08-28 ENCOUNTER — Encounter (HOSPITAL_COMMUNITY): Payer: Self-pay

## 2021-08-28 DIAGNOSIS — E041 Nontoxic single thyroid nodule: Secondary | ICD-10-CM | POA: Diagnosis not present

## 2021-08-28 DIAGNOSIS — R0600 Dyspnea, unspecified: Secondary | ICD-10-CM | POA: Diagnosis not present

## 2021-08-28 DIAGNOSIS — R911 Solitary pulmonary nodule: Secondary | ICD-10-CM | POA: Diagnosis not present

## 2021-08-28 DIAGNOSIS — R079 Chest pain, unspecified: Secondary | ICD-10-CM | POA: Diagnosis not present

## 2021-08-28 LAB — LACTIC ACID, PLASMA: Lactic Acid, Venous: 3.4 mmol/L (ref 0.5–1.9)

## 2021-08-28 LAB — TROPONIN I (HIGH SENSITIVITY): Troponin I (High Sensitivity): 2 ng/L (ref <18)

## 2021-08-28 MED ORDER — IOHEXOL 350 MG/ML SOLN
80.0000 mL | Freq: Once | INTRAVENOUS | Status: AC | PRN
  Administered 2021-08-28: 80 mL via INTRAVENOUS

## 2021-08-28 MED ORDER — POTASSIUM CHLORIDE CRYS ER 20 MEQ PO TBCR
40.0000 meq | EXTENDED_RELEASE_TABLET | Freq: Once | ORAL | Status: AC
  Administered 2021-08-28: 40 meq via ORAL
  Filled 2021-08-28: qty 2

## 2021-08-28 MED ORDER — DOXYCYCLINE HYCLATE 100 MG PO CAPS: 100.0000 mg | ORAL_CAPSULE | Freq: Two times a day (BID) | ORAL | 0 refills | Status: DC

## 2021-09-02 DIAGNOSIS — Z20828 Contact with and (suspected) exposure to other viral communicable diseases: Secondary | ICD-10-CM | POA: Diagnosis not present

## 2021-09-27 ENCOUNTER — Telehealth: Payer: Self-pay | Admitting: Internal Medicine

## 2021-09-27 NOTE — Telephone Encounter (Signed)
Called and spoke with pt who requested to have her last OV notes as well as med list printed out. Stated to her that this has been done and that it has been placed up front for her to come by to pick up and she verbalized understanding. Nothing further needed.

## 2021-10-22 ENCOUNTER — Ambulatory Visit: Payer: Medicare Other | Admitting: Allergy

## 2021-12-13 DIAGNOSIS — Z20822 Contact with and (suspected) exposure to covid-19: Secondary | ICD-10-CM | POA: Diagnosis not present

## 2021-12-22 ENCOUNTER — Ambulatory Visit (INDEPENDENT_AMBULATORY_CARE_PROVIDER_SITE_OTHER): Payer: Medicare Other | Admitting: Internal Medicine

## 2021-12-22 ENCOUNTER — Other Ambulatory Visit: Payer: Self-pay

## 2021-12-22 ENCOUNTER — Encounter: Payer: Self-pay | Admitting: Internal Medicine

## 2021-12-22 DIAGNOSIS — J4530 Mild persistent asthma, uncomplicated: Secondary | ICD-10-CM | POA: Diagnosis not present

## 2021-12-22 DIAGNOSIS — J310 Chronic rhinitis: Secondary | ICD-10-CM

## 2021-12-22 DIAGNOSIS — Z23 Encounter for immunization: Secondary | ICD-10-CM | POA: Diagnosis not present

## 2021-12-22 MED ORDER — BUDESONIDE-FORMOTEROL FUMARATE 160-4.5 MCG/ACT IN AERO: 2.0000 | INHALATION_SPRAY | Freq: Two times a day (BID) | RESPIRATORY_TRACT | 3 refills | Status: DC

## 2021-12-22 MED ORDER — PREDNISONE 10 MG PO TABS: ORAL_TABLET | ORAL | 5 refills | Status: DC

## 2021-12-22 NOTE — Patient Instructions (Signed)
Plan A = Automatic = Always=    Symbicort 160 Take 2 puffs first thing in am and then another 2 puffs about 12 hours later.  ?  ? ?Plan B = Backup (to supplement plan A, not to replace it) ?Only use your albuterol inhaler as a rescue medication to be used if you can't catch your breath by resting or doing a relaxed purse lip breathing pattern.  ?- The less you use it, the better it will work when you need it. ?- Ok to use the inhaler up to 2 puffs  every 4 hours if you must but call for appointment if use goes up over your usual need ?- Don't leave home without it !!  (think of it like the spare tire for your car)  ? ?Plan C = Crisis (instead of Plan B but only if Plan B stops working) ?- only use your albuterol nebulizer if you first try Plan B and it fails to help > ok to use the nebulizer up to every 4 hours but if start needing it regularly call for immediate appointment ? ? ?Plan D = Deltasone = Prednisone 10 mg take  4 each am x 2 days,   2 each am x 2 days,  1 each am x 2 days and stop  ? ? ?Make appt to see Dr Selena Batten and here prn  ?

## 2021-12-22 NOTE — Progress Notes (Signed)
Subjective:  ?  ? Patient ID: Lori Black, female   DOB: [birth year]    MRN: [id number] ? ?Brief patient profile:  ?46  yowf never smoker asthma since childhood never free of symptoms or need for inhaler and frequent prednisone rx x every few months maint by [facility] doctor on symbicort fall 2017 with no change pattern > referred to pulmonary clinic 03/17/2017 by EDP p 3rd ER trip in 6 months with one admit for asthma     ? ? ? ?History of Present Illness  ?03/17/2017 1st [facility] office visit/ Lori Black   ?Chief Complaint  ?Patient presents with  ? Pulmonary Consult  ?  Self referral. Pt states that she was dxed with Asthma at birth. She recently moved here from [facility]. She has been out of her Symbicort for the past month. She has been having increased SOB and cough with thick, yellow sputum recently.  She has been using albuterol neb and albuterol inhaler at least once per day.   ?just took last prednisone on day of ov ?Out of symb x sev weeks   But when on symb and pred only needs neb once a day  ?Has assoc nasal congestions and rattling cough worse at hs and in AM ?Baseline hfa very poor - see hfa  ?rec ? Plan A = Automatic = symbicort 160 Take 2 puffs first thing in am and then another 2 puffs about 12 hours later.  ?Work on inhaler technique:   ?Plan B = Backup ?Only use your albuterol as a rescue medication   ? ? ? ? ?01/16/2018  f/u ov/Lori Black re: chronic asthma since childhood/ on disability due to asthma  maint on symb 160 ?Chief Complaint  ?Patient presents with  ? Follow-up  ?  f/u asthma, allergies, sinus problems causing breathing issues, using nebulizers more  ?baseline Dyspnea:  Not limited by breathing from desired activities   ?Cough: worse with pollen ?Sleep: 30 degrees, occ neb noct. Lots of noct nasal congestion ?SABA use:  Prior to 2-3 weeks prior to [place]  Was not needing any saba now even needing neb ?rec ?Please remember to go to the lab department downstairs in the basement  for your tests - we will call you  with the results when they are available. ?Singulair(montelukast)  10 mg one daily  ?If not better prednisone x 6 days:   Prednisone 10 mg take  4 each am x 2 days,   2 each am x 2 days,  1 each am x 2 days and stop  ?Plan A = Automatic = symbicort 160 Take 2 puffs first thing in am and then another 2 puffs about 12 hours later.  ?Work on Psychologist, educational technique: ?  Plan B = Backup ?Only use your albuterol as a rescue medication ?Plan C = Crisis ?- only use your albuterol nebulizer if you first try Plan B and it fails to help > ok to use the nebulizer up to every 4 hours but if start needing it regularly call for immediate appointment ? ? ? ?  ?  ?06/28/2021  f/u ov/Lori Black re: asthma   maint on symbicort  160 bid but now s/p septic arthritis rx [place] ?Chief Complaint  ?Patient presents with  ? Follow-up  ?  Asthma  ? Dyspnea:  Not limited by breathing from desired activities  / sometimes L knee limiting ?Cough: minimal not noct/ no nasty mucus  ?Sleeping: flat bed with several pillowers ?SABA use: twice a  week mostly when gets too hot - "rescue" is atrovent  ?02: none ?Covid status:   vax x 2  ?Rec ?Plan A = Automatic = Always=    due to infection try change Symbicort 80 Take 2 puffs first thing in am and then another 2 puffs about 12 hours later.  ?Work on inhaler technique:   ?  Plan B = Backup (to supplement plan A, not to replace it) ?Only use your albuterol inhaler as a rescue medication ?Plan C = Crisis (instead of Plan B but only if Plan B stops working) ?- only use your albuterol nebulizer if you first try Plan B  ? ? ? ?12/22/2021  f/u ov/Lori Black re: asthma   maint on symbicort 160   / last prednisone was one month prior to OV , has not been back to allergy yet  ?Chief Complaint  ?Patient presents with  ? Follow-up  ?  C/o sob with pollen, dry cough, occass. wheezing  ? Dyspnea:  usually fine indoors  ?Cough: dry p stirring / nasal congestion ?Sleeping: 3 pillows  ?SABA use: once a day / neb  ?02:  none ?Covid status:   vax x 2  ? ? ?No obvious day to day or daytime variability or assoc excess/ purulent sputum or mucus plugs or hemoptysis or cp or chest tightness, subjective wheeze or overt sinus or hb symptoms.  ? ?Sleeping now  without nocturnal  or early am exacerbation  of respiratory  c/o's or need for noct saba. Also denies any obvious fluctuation of symptoms with weather or environmental changes or other aggravating or alleviating factors except as outlined above  ? ?No unusual exposure hx or h/o childhood pna or knowledge of premature birth. ? ?Current Allergies, Complete Past Medical History, Past Surgical History, Family History, and Social History were reviewed in [employer] record. ? ?ROS  The following are not active complaints unless bolded ?Hoarseness, sore throat, dysphagia, dental problems, itching, sneezing,  nasal congestion or discharge of excess mucus or purulent secretions, ear ache,   fever, chills, sweats, unintended wt loss or wt gain, classically pleuritic or exertional cp,  orthopnea pnd or arm/hand swelling  or leg swelling, presyncope, palpitations, abdominal pain, anorexia, nausea, vomiting, diarrhea  or change in bowel habits or change in bladder habits, change in stools or change in urine, dysuria, hematuria,  rash, arthralgias, visual complaints, headache, numbness, weakness or ataxia or problems with walking or coordination,  change in mood or  memory. ?      ? ?Current Meds  ?Medication Sig  ? albuterol (PROAIR HFA) 108 (90 Base) MCG/ACT inhaler 2 puffs every 4 hours as needed only  if your can't catch your breath  ? albuterol (PROVENTIL) (2.5 MG/3ML) 0.083% nebulizer solution Take 3 mLs (2.5 mg total) by nebulization every 4 (four) hours as needed for wheezing or shortness of breath.  ? budesonide-formoterol (SYMBICORT) 160-4.5 MCG/ACT inhaler Inhale 2 puffs into the lungs 2 (two) times daily.  ? Phenylephrine-APAP-guaiFENesin (TYLENOL SINUS SEVERE)  5-325-200 MG TABS Take by mouth. As needed  ?    ?  ? ?  ? ?  ?   ? ?   ?Objective:  ? Physical Exam  ? ? 12/22/2021          152   ?06/28/2021         147  ?07/14/2020         150  ?11/21/2019  153  ?05/07/2019         152 ?02/06/2019        145  ?11/06/2018        148  ?07/30/2018      143  ?04/17/2018          145  ?01/16/2018          150  ?04/17/2017          150    ?03/17/17 150 lb (68 kg)  ?03/07/17 150 lb 4 oz (68.2 kg)  ?10/09/16 162 lb 1.6 oz (73.5 kg)  ?  ? Vital signs reviewed  12/22/2021  - Note at rest 02 sats  100% on RA  ? ?General appearance:    amb wf nad ? ? HEENT : pt wearing mask not removed for exam due to covid -19 concerns.  ? ? ?NECK :  without JVD/Nodes/TM/ nl carotid upstrokes bilaterally ? ? ?LUNGS: no acc muscle use,  Nl contour chest which is clear to A and P bilaterally without cough on insp or exp maneuvers ? ? ?CV:  RRR  no s3 or murmur or increase in P2, and no edema  ? ?ABD:  soft and nontender with nl inspiratory excursion in the supine position. No bruits or organomegaly appreciated, bowel sounds nl ? ?MS:  Nl gait/ ext warm without deformities, calf tenderness, cyanosis or clubbing ?No obvious joint restrictions  ? ?SKIN: warm and dry without lesions   ? ?NEURO:  alert, approp, nl sensorium with  no motor or cerebellar deficits apparent.   ? ? ? ? ?I personally reviewed images and agree with radiology impression as follows:  ? Chest CTa  08/28/21  ?No pulmonary embolism. ?  ?Moderate to severe bronchial wall thickening diffusely and scattered ?areas of peripheral airway impaction within the lung bases in ?keeping with airway inflammation. Shotty bilateral hilar adenopathy ?may be reactive in nature. ? ?   ?Assessment:  ?   ?  ?   ? ?

## 2021-12-22 NOTE — Assessment & Plan Note (Addendum)
Onset in childhood ?- Allergy profile 04/17/2018 >  Eos 0.2 /  IgE  128 RAST pos cat > dog, dust and oak trees ?No response to singulair as of 06/2018 so d/c'd ?- Sinus CT  08/07/2018 1. Mild patchy mucosal thickening. 2. Prominent bilateral concha bullosa.> referred to ent as no cat/dog and symptoms are year round /worse outside and not suggestive of an allergic mech   ?- referred to ent>>>  [name] eval 11/20/18 rec flonase/ns irrigation and allegra > helps some  ?- 11/21/2019 rec trial of dymista one bid > could not afford it  ?  ?- 05/03/21 Sinus CT : Poor dentition with multiple carious teeth and multifocal periapical ?lucencies, as described.Mild-to-moderate mucosal thickening within the inferior maxillary ?sinuses, bilaterally ?- seen by [facility]  07/22/21 > consider allergy shots ? ?Advised f/u with allergy  ? ?    ?  ? ?Each maintenance medication was reviewed in detail including emphasizing most importantly the difference between maintenance and prns and under what circumstances the prns are to be triggered using an action plan format where appropriate. ? ?Total time for H and P, chart review, counseling, reviewing hfa device(s) and generating customized AVS unique to this summary office visit / same day charting > 30 min  ?

## 2021-12-22 NOTE — Assessment & Plan Note (Signed)
Onset in childhood ?FENO 03/17/2017  =   30 on final day of prednisone ?- Spirometry 03/17/2017  wnl s curvature on last day of pred p saba 5 h prior  ?- 03/17/2017   Start symb 160 2bid ?- 01/16/2018  After extensive coaching inhaler device  effectiveness =    75% (short Ti)   ?- FENO 01/16/2018  =   62 on symb 160 2bid  ?- Spirometry 01/16/2018  wnl with  Min curvature  ?- 01/16/2018 added singulair ?- Allergy profile 04/17/2018 >  Eos 0.2 /  IgE  128 RAST pos cat > dog, dust and oak trees ?- FENO 04/17/2018  =  30 p  symb 160/ singulair  ?- 06/2018 did not refill singulair as no better   ?- allergy eval [facility]  07/22/21 > consider allergy shots ?- 12/22/2021  After extensive coaching inhaler device,  effectiveness =    90% so added pred as plan D  ? ?Continues with freq flares req prednisone so rec return to allergy for shots vs biologics and here prn  ?

## 2021-12-27 ENCOUNTER — Ambulatory Visit: Payer: Medicare Other | Admitting: Internal Medicine

## 2022-01-21 DIAGNOSIS — Z20822 Contact with and (suspected) exposure to covid-19: Secondary | ICD-10-CM | POA: Diagnosis not present

## 2022-01-22 ENCOUNTER — Emergency Department (HOSPITAL_COMMUNITY): Payer: Medicare Other

## 2022-01-22 ENCOUNTER — Other Ambulatory Visit: Payer: Self-pay

## 2022-01-22 ENCOUNTER — Encounter (HOSPITAL_COMMUNITY): Payer: Self-pay | Admitting: Emergency Medicine

## 2022-01-22 ENCOUNTER — Emergency Department (HOSPITAL_COMMUNITY)
Admission: EM | Admit: 2022-01-22 | Discharge: 2022-01-22 | Disposition: A | Discharge status: Home / Self Care | Payer: Medicare Other | Attending: Emergency Medicine | Admitting: Emergency Medicine

## 2022-01-22 DIAGNOSIS — M25461 Effusion, right knee: Secondary | ICD-10-CM | POA: Insufficient documentation

## 2022-01-22 DIAGNOSIS — M25561 Pain in right knee: Secondary | ICD-10-CM | POA: Insufficient documentation

## 2022-01-22 DIAGNOSIS — E876 Hypokalemia: Secondary | ICD-10-CM | POA: Diagnosis not present

## 2022-01-22 DIAGNOSIS — M7989 Other specified soft tissue disorders: Secondary | ICD-10-CM | POA: Diagnosis not present

## 2022-01-22 DIAGNOSIS — D649 Anemia, unspecified: Secondary | ICD-10-CM | POA: Diagnosis not present

## 2022-01-22 LAB — SYNOVIAL CELL COUNT + DIFF, W/ CRYSTALS
Crystals, Fluid: NONE SEEN
Lymphocytes-Synovial Fld: 1 % (ref 0–20)
Monocyte-Macrophage-Synovial Fluid: 7 % — ABNORMAL LOW (ref 50–90)
Neutrophil, Synovial: 92 % — ABNORMAL HIGH (ref 0–25)
WBC, Synovial: 18690 /mm3 — ABNORMAL HIGH (ref 0–200)

## 2022-01-22 LAB — COMPREHENSIVE METABOLIC PANEL
ALT: 11 U/L (ref 0–44)
AST: 14 U/L — ABNORMAL LOW (ref 15–41)
Albumin: 3.9 g/dL (ref 3.5–5.0)
Alkaline Phosphatase: 55 U/L (ref 38–126)
Anion gap: 11 (ref 5–15)
BUN: 11 mg/dL (ref 6–20)
CO2: 29 mmol/L (ref 22–32)
Calcium: 8.5 mg/dL — ABNORMAL LOW (ref 8.9–10.3)
Chloride: 97 mmol/L — ABNORMAL LOW (ref 98–111)
Creatinine, Ser: 0.67 mg/dL (ref 0.44–1.00)
GFR, Estimated: >60 mL/min (ref >60)
Glucose, Bld: 116 mg/dL — ABNORMAL HIGH (ref 70–99)
Potassium: 2.9 mmol/L — ABNORMAL LOW (ref 3.5–5.1)
Sodium: 137 mmol/L (ref 135–145)
Total Bilirubin: 1.2 mg/dL (ref 0.3–1.2)
Total Protein: 7.8 g/dL (ref 6.5–8.1)

## 2022-01-22 LAB — CBC WITH DIFFERENTIAL/PLATELET
Abs Immature Granulocytes: 0.04 10*3/uL (ref 0.00–0.07)
Basophils Absolute: 0 10*3/uL (ref 0.0–0.1)
Basophils Relative: 0 %
Eosinophils Absolute: 0.1 10*3/uL (ref 0.0–0.5)
Eosinophils Relative: 1 %
HCT: 30.8 % — ABNORMAL LOW (ref 36.0–46.0)
Hemoglobin: 9.3 g/dL — ABNORMAL LOW (ref 12.0–15.0)
Immature Granulocytes: 0 %
Lymphocytes Relative: 14 %
Lymphs Abs: 1.3 10*3/uL (ref 0.7–4.0)
MCH: 21.7 pg — ABNORMAL LOW (ref 26.0–34.0)
MCHC: 30.2 g/dL (ref 30.0–36.0)
MCV: 71.8 fL — ABNORMAL LOW (ref 80.0–100.0)
Monocytes Absolute: 0.7 10*3/uL (ref 0.1–1.0)
Monocytes Relative: 7 %
Neutro Abs: 7.4 10*3/uL (ref 1.7–7.7)
Neutrophils Relative %: 78 %
Platelets: 252 10*3/uL (ref 150–400)
RBC: 4.29 MIL/uL (ref 3.87–5.11)
RDW: 17.3 % — ABNORMAL HIGH (ref 11.5–15.5)
WBC: 9.6 10*3/uL (ref 4.0–10.5)
nRBC: 0 % (ref 0.0–0.2)

## 2022-01-22 LAB — URIC ACID: Uric Acid, Serum: 4.5 mg/dL (ref 2.5–7.1)

## 2022-01-22 LAB — LACTIC ACID, PLASMA
Lactic Acid, Venous: 1.9 mmol/L (ref 0.5–1.9)
Lactic Acid, Venous: 1.1 mmol/L (ref 0.5–1.9)

## 2022-01-22 LAB — C-REACTIVE PROTEIN: CRP: 7.5 mg/dL — ABNORMAL HIGH (ref <1.0)

## 2022-01-22 LAB — SEDIMENTATION RATE: Sed Rate: 60 mm/hr — ABNORMAL HIGH (ref 0–22)

## 2022-01-22 MED ORDER — LIDOCAINE-EPINEPHRINE 2 %-1:100000 IJ SOLN
20.0000 mL | Freq: Once | INTRAMUSCULAR | Status: AC
Start: 2022-01-22 — End: 2022-01-22
  Administered 2022-01-22: 20 mL via INTRADERMAL
  Filled 2022-01-22: qty 1

## 2022-01-22 MED ORDER — POTASSIUM CHLORIDE CRYS ER 20 MEQ PO TBCR: 20.0000 meq | EXTENDED_RELEASE_TABLET | Freq: Two times a day (BID) | ORAL | 0 refills | Status: DC

## 2022-01-22 MED ORDER — POTASSIUM CHLORIDE CRYS ER 20 MEQ PO TBCR
40.0000 meq | EXTENDED_RELEASE_TABLET | Freq: Once | ORAL | Status: AC
  Administered 2022-01-22: 40 meq via ORAL
  Filled 2022-01-22: qty 2

## 2022-01-22 MED ORDER — SODIUM CHLORIDE 0.9 % IV SOLN
2.0000 g | Freq: Once | INTRAVENOUS | Status: AC
  Administered 2022-01-22: 2 g via INTRAVENOUS
  Filled 2022-01-22: qty 20

## 2022-01-22 MED ORDER — VANCOMYCIN HCL 1250 MG/250ML IV SOLN
1250.0000 mg | Freq: Once | INTRAVENOUS | Status: AC
  Administered 2022-01-22: 1250 mg via INTRAVENOUS
  Filled 2022-01-22: qty 250

## 2022-01-22 NOTE — ED Provider Notes (Addendum)
I provided a substantive portion of the care of this patient.  I personally performed the entirety of the history, exam, and medical decision making for this encounter. ? ?   ? ? ?Patient seen by me along with physician assistant.  Patient here with swelling to the right knee started yesterday got worse.  No known injury but may be a questionable twisting injury.  Patient denies any fevers.  The patient's past medical history is quite significant for bilateral septic knee joint formally diagnosed and treated in July 2022.  And also had PICC line for period of time for long-term antibiotics and followed by infectious disease.  Patient had both knees washed out by orthopedics at that time.  Specific etiology was not clear however she did grow staph type species out of the knee. ? ?No history of gout or pseudogout or inflammatory arthritis otherwise. ? ?Based on this we decided do labs that were going to tap the knee joint.  White blood cell count 9.6 on CBC hemoglobin down a little bit at 9.3 lactic acid 1.9 patient has sed rate and C-reactive protein pending.  Had blood cultures.  Also has a uric acid pending.  Patient's complete metabolic panel with electrolytes is also still pending. ? ?The x-ray of the knee showed a moderate joint effusion which we can tell clinically hazy density in the medial compartment is favored to reflect a chondrocalcinosis which can be seen in the setting of calcium pyrophosphate crystal deposition. ? ?Patient's knee shows a moderate effusion.  No erythema increased warmth.  Distally neurovascularly intact.  When had and sterilely prepped her and numbed up the skin with lidocaine with epinephrine.  And I went ahead and removed the effusion got about 30 cc back of straw-colored fluid.  Not pure pus.  But somewhat opaque. ? ?This will be sent off for labs. ? ?Disposition will be based on lab results. ? ? ?  ?Vanetta Mulders, MD ?01/22/22 1703 ? ?Patient's labs concerning for significant  infection in the knee.  However Gram stain is showing no organisms.  White blood cell count 18,690.  Which is not that high.  There is no crystals as well.  Patient's white blood cell count slight elevated lactic acid is normal.  Had discussion both with orthopedics.  They are going to follow her up in the office.  Discussions with infectious disease because of her past history of having bilateral septic knee joint infections due to staph before and they said that she could be an option to have her admitted treated with antibiotics until the cultures are negative.  Patient did not want to be admitted.  We did feel strongly about that.  Patient was given a dose of Rocephin and vancomycin here.  Patient given strict precautions return for any new or worse symptoms will follow-up with orthopedics.  Orthopedics is going to follow the cultures. ? ?  ?Vanetta Mulders, MD ?01/22/22 2119 ? ?

## 2022-01-22 NOTE — Discharge Instructions (Addendum)
You were seen in the ED for evaluation of your right knee pain. We were able to take off some fluid from your knee. They seen white blood cells, but no organism. An organism can still grow over the next few days, so make sure you are answering your phone if someone calls. I have included more information on knee pain and the RICE method. Please review these. If you have worsening pain, worsening swelling, color changes, worsening mobility, or fever, please return immediately to the ER. Please follow up with Dr. Carlean Jews office on Monday.  Your potassium was low today. We I am sending you home with two days of repletion. You will need to have your potassium checked later in the week. Please call your PCP for re-recheck of this. ? ?Contact a doctor if: ?The knee pain does not stop. ?The knee pain changes or gets worse. ?You have a fever along with knee pain. ?Your knee is red or feels warm when you touch it. ?Your knee gives out or locks up. ?Get help right away if: ?Your knee swells, and the swelling gets worse. ?You cannot move your knee. ?You have very bad knee pain that does not get better with pain medicine. ?

## 2022-01-22 NOTE — ED Notes (Signed)
Pt states that she will stay long enough to receive antibiotics but is unable to stay overnight in the hospital due to not having anyone to watch her children tonight.  ?

## 2022-01-22 NOTE — ED Provider Notes (Signed)
?[facility] ?Provider Note ? ? ?CSN: [id number] ?Arrival date & time: 01/22/22  1416 ? ?  ? ?History ?Chief Complaint  ?Patient presents with  ? Knee Pain  ? ? ?[facility] is a 36 y.o. female with h/o septic arthritis and asthma presents to the ED for evaluation of atraumatic knee swelling for 2 days.  She denies any chest pain or shortness of breath.  She reports it is hurting to walk on it and she is having to use a cane.  She denies any fever.  She denies any IV drug use ever.  Denies any history of DVT or PE.  Denies any travel or hormone use.  She recently has septic arthritis bilaterally in July 2022. ? ? ?Knee Pain ?Associated symptoms: no fever   ? ?  ? ?Home Medications ?Prior to Admission medications   ?Medication Sig Start Date End Date Taking? Authorizing Provider  ?albuterol (PROAIR HFA) 108 (90 Base) MCG/ACT inhaler 2 puffs every 4 hours as needed only  if your can't catch your breath 06/28/21   Nyoka Cowden, MD  ?albuterol (PROVENTIL) (2.5 MG/3ML) 0.083% nebulizer solution Take 3 mLs (2.5 mg total) by nebulization every 4 (four) hours as needed for wheezing or shortness of breath. 12/18/20   Parrett, Virgel Bouquet, NP  ?budesonide-formoterol (SYMBICORT) 160-4.5 MCG/ACT inhaler Inhale 2 puffs into the lungs 2 (two) times daily. 12/22/21   Nyoka Cowden, MD  ?Phenylephrine-APAP-guaiFENesin (TYLENOL SINUS SEVERE) 5-325-200 MG TABS Take by mouth. As needed    [provider]  ?predniSONE (DELTASONE) 10 MG tablet Take  4 each am x 2 days,   2 each am x 2 days,  1 each am x 2 days and stop 12/22/21   Nyoka Cowden, MD  ?   ? ?Allergies    ?Patient has no known allergies.   ? ?Review of Systems   ?Review of Systems  ?Constitutional:  Negative for chills and fever.  ?Respiratory:  Negative for shortness of breath.   ?Cardiovascular:  Negative for chest pain.  ?Musculoskeletal:  Positive for arthralgias and joint swelling.  ? ?Physical Exam ?Updated Vital Signs ?BP 116/81    Pulse (!) 104   Temp 98.5 ?F (36.9 ?C) (Oral)   Resp 14   Ht 5\' 5"  (1.651 m)   Wt 69 kg   LMP 01/20/2022   SpO2 100%   BMI 25.31 kg/m?  ?Physical Exam ?Vitals and nursing note reviewed.  ?Constitutional:   ?   Appearance: Normal appearance.  ?HENT:  ?   Head: Normocephalic and atraumatic.  ?Eyes:  ?   General: No scleral icterus. ?Cardiovascular:  ?   Rate and Rhythm: Normal rate and regular rhythm.  ?Pulmonary:  ?   Effort: Pulmonary effort is normal.  ?   Breath sounds: Normal breath sounds.  ?Abdominal:  ?   General: Bowel sounds are normal.  ?   Palpations: Abdomen is soft.  ?   Tenderness: There is no abdominal tenderness. There is no guarding or rebound.  ?Musculoskeletal:     ?   General: Swelling and tenderness present. No deformity.  ?   Cervical back: Normal range of motion.  ?   Comments: Moderate swelling noted to the right knee is tenderness to palpation of the anterior knee.  No overlying erythema or skin changes.  No overlying warmth.  She does not have any swelling in the distal leg.  Palpable DP and PT pulses intact.  Compartments are soft.  Sensations intact bilaterally.  No red streaking noted.  ?Skin: ?   General: Skin is warm and dry.  ?Neurological:  ?   General: No focal deficit present.  ?   Mental Status: She is alert. Mental status is at baseline.  ? ? ?ED Results / Procedures / Treatments   ?Labs ?(all labs ordered are listed, but only abnormal results are displayed) ?Labs Reviewed  ?CBC WITH DIFFERENTIAL/PLATELET - Abnormal; Notable for the following components:  ?    Result Value  ? Hemoglobin 9.3 (*)   ? HCT 30.8 (*)   ? MCV 71.8 (*)   ? MCH 21.7 (*)   ? RDW 17.3 (*)   ? All other components within normal limits  ?COMPREHENSIVE METABOLIC PANEL - Abnormal; Notable for the following components:  ? Potassium 2.9 (*)   ? Chloride 97 (*)   ? Glucose, Bld 116 (*)   ? Calcium 8.5 (*)   ? AST 14 (*)   ? All other components within normal limits  ?CULTURE, BLOOD (ROUTINE X 2)  ?CULTURE,  BLOOD (ROUTINE X 2)  ?BODY FLUID CULTURE W GRAM STAIN  ?URIC ACID  ?LACTIC ACID, PLASMA  ?SEDIMENTATION RATE  ?C-REACTIVE PROTEIN  ?LACTIC ACID, PLASMA  ?GLUCOSE, BODY FLUID OTHER            ?PROTEIN, BODY FLUID (OTHER)  ?SYNOVIAL CELL COUNT + DIFF, W/ CRYSTALS  ? ? ?EKG ?None ? ?Radiology ?DG Knee Complete 4 Views Right ? ?Result Date: 01/22/2022 ?CLINICAL DATA:  swelling EXAM: RIGHT KNEE - COMPLETE 4+ VIEW COMPARISON:  Knee radiograph dated April 29, 2021 FINDINGS: No acute fracture or dislocation. Mild degenerative changes of the medial compartment. Increased hazy density along the medial compartment favored to reflect chondrocalcinosis. No area of erosion or osseous destruction. No unexpected radiopaque foreign body. Moderate joint effusion. IMPRESSION: 1. There is a moderate joint effusion. Increase in hazy density in the medial compartment is favored to reflect chondrocalcinosis which can be seen in the setting of calcium pyrophosphate dihydrate crystal deposition disease. Differential considerations include loose body. Electronically Signed   By: Meda KlinefelterStephanie  Peacock M.D.   On: 01/22/2022 16:06   ? ?Procedures ?.Joint Aspiration/Arthrocentesis ? ?Date/Time: 01/22/2022 5:26 PM ?Performed by: Achille Richansom, Ellin Fitzgibbons, PA-C ?Authorized by: Achille Richansom, Stefani Baik, PA-C  ? ?Consent:  ?  Consent obtained:  Verbal ?  Consent given by:  Patient ?  Risks, benefits, and alternatives were discussed: yes   ?  Risks discussed:  Infection, pain, incomplete drainage and bleeding ?  Alternatives discussed:  No treatment ?Universal protocol:  ?  Procedure explained and questions answered to patient or proxy's satisfaction: yes   ?  Imaging studies available: yes   ?  Patient identity confirmed:  Verbally with patient ?Location:  ?  Location:  Knee ?  Knee:  R knee ?Anesthesia:  ?  Anesthesia method:  Local infiltration ?  Local anesthetic:  Lidocaine 2% WITH epi ?Procedure details:  ?  Preparation: Patient was prepped and draped in usual sterile  fashion   ?  Needle gauge:  18 G ?  Ultrasound guidance: no   ?  Approach:  Lateral ?  Aspirate amount:  33cc ?  Aspirate characteristics:  Cloudy, purulent and yellow ?Post-procedure details:  ?  Dressing:  Adhesive bandage ?  Procedure completion:  Tolerated well, no immediate complications  ? ? ?Medications Ordered in ED ?Medications  ?lidocaine-EPINEPHrine (XYLOCAINE W/EPI) 2 %-1:100000 (with pres) injection 20 mL (20 mLs Intradermal Given 01/22/22 1704)  ?potassium  chloride SA (KLOR-CON M) CR tablet 40 mEq (40 mEq Oral Given 01/22/22 1752)  ?cefTRIAXone (ROCEPHIN) 2 g in sodium chloride 0.9 % 100 mL IVPB (0 g Intravenous Stopped 01/22/22 2138)  ?vancomycin (VANCOREADY) IVPB 1250 mg/250 mL (0 mg Intravenous Stopped 01/22/22 2240)  ? ? ?ED Course/ Medical Decision Making/ A&P ?  ?                        ?Medical Decision Making ?Amount and/or Complexity of Data Reviewed ?Labs: ordered. ?Radiology: ordered. ? ?Risk ?Prescription drug management. ? ? ?36 year old female with history of septic arthritis presents the emergency department for evaluation of right knee pain and swelling gradually worsening the past 2 days.  Differential diagnosis includes was not limited to gout, pseudogout, inflammatory arthritis, septic arthritis, sprain, strain.  Physical exam is pertinent for moderate swelling noted to the right knee is tenderness to palpation of the anterior knee.  No overlying erythema or skin changes.  No overlying warmth.  She does not have any swelling in the distal leg.  Palpable DP and PT pulses intact.  Compartments are soft.  Sensations intact bilaterally.  No red streaking noted.  ? ?On prior chart review, the patient had bilateral septic knee joints and was treated in July 2022.  Treatment required PICC line for a long period of time for antibiotics and she was being followed by infectious disease.  She had both knees washed out by orthopedics.  There was unknown the specifically what the etiology was, possibly  from her dentition, however she did grow staph species with the fluid in her knee.  She has no history of gout or any inflammatory arthritis. ? ?Given her history of septic arthritis, joint tap was performed and a

## 2022-01-22 NOTE — ED Notes (Addendum)
This tech attempted to draw a set of cultures was unable to find a good vein. ?

## 2022-01-22 NOTE — Progress Notes (Signed)
A consult was received from an ED physician for Vancomycin per pharmacy dosing.  The patient's profile has been reviewed for ht/wt/allergies/indication/available labs.   ?A one time order has been placed for Vancomycin 1250mg  IV x 1.  Further antibiotics/pharmacy consults should be ordered by admitting physician if indicated.       ?                ? ?Miyu Fenderson S. , PharmD, BCPS ?Clinical Staff Pharmacist ?[email or web address] ?Thank you, ?Merilynn Finland, Merilynn Finland ?01/22/2022  8:25 PM ? ?

## 2022-01-24 DIAGNOSIS — Z20822 Contact with and (suspected) exposure to covid-19: Secondary | ICD-10-CM | POA: Diagnosis not present

## 2022-01-24 DIAGNOSIS — R059 Cough, unspecified: Secondary | ICD-10-CM | POA: Diagnosis not present

## 2022-01-24 DIAGNOSIS — R051 Acute cough: Secondary | ICD-10-CM | POA: Diagnosis not present

## 2022-01-25 DIAGNOSIS — Z23 Encounter for immunization: Secondary | ICD-10-CM | POA: Diagnosis not present

## 2022-01-25 LAB — PROTEIN, BODY FLUID (OTHER): Total Protein, Body Fluid Other: 4.4 g/dL

## 2022-01-25 LAB — GLUCOSE, BODY FLUID OTHER: Glucose, Body Fluid Other: 44 mg/dL

## 2022-01-26 LAB — BODY FLUID CULTURE W GRAM STAIN: Culture: NO GROWTH

## 2022-01-27 LAB — CULTURE, BLOOD (ROUTINE X 2)
Culture: NO GROWTH
Culture: NO GROWTH
Special Requests: ADEQUATE

## 2022-01-31 DIAGNOSIS — Z20822 Contact with and (suspected) exposure to covid-19: Secondary | ICD-10-CM | POA: Diagnosis not present

## 2022-02-02 DIAGNOSIS — M25461 Effusion, right knee: Secondary | ICD-10-CM | POA: Diagnosis not present

## 2022-02-21 DIAGNOSIS — Z23 Encounter for immunization: Secondary | ICD-10-CM | POA: Diagnosis not present

## 2022-02-21 DIAGNOSIS — Z20822 Contact with and (suspected) exposure to covid-19: Secondary | ICD-10-CM | POA: Diagnosis not present

## 2022-02-24 ENCOUNTER — Telehealth: Payer: Self-pay | Admitting: Internal Medicine

## 2022-02-24 MED ORDER — ALBUTEROL SULFATE (2.5 MG/3ML) 0.083% IN NEBU: 2.5000 mg | INHALATION_SOLUTION | RESPIRATORY_TRACT | 3 refills | Status: DC | PRN

## 2022-02-24 NOTE — Telephone Encounter (Signed)
Called and spoke with patient. She stated that she is completely out of her albuterol nebulizer solution. She had asked [facility] to contact our office but they hadn't yet. I advised her that I would go ahead and send in a refill for her. She verbalized understanding.  ? ?Nothing further needed at time of call.  ?

## 2022-03-03 ENCOUNTER — Emergency Department (HOSPITAL_COMMUNITY)
Admission: EM | Admit: 2022-03-03 | Discharge: 2022-03-04 | Disposition: A | Discharge status: Home / Self Care | Payer: Medicare Other | Attending: Emergency Medicine | Admitting: Emergency Medicine

## 2022-03-03 ENCOUNTER — Other Ambulatory Visit: Payer: Self-pay

## 2022-03-03 ENCOUNTER — Encounter (HOSPITAL_COMMUNITY): Payer: Self-pay

## 2022-03-03 DIAGNOSIS — J019 Acute sinusitis, unspecified: Secondary | ICD-10-CM

## 2022-03-03 DIAGNOSIS — R519 Headache, unspecified: Secondary | ICD-10-CM | POA: Insufficient documentation

## 2022-03-03 DIAGNOSIS — Z20822 Contact with and (suspected) exposure to covid-19: Secondary | ICD-10-CM | POA: Diagnosis not present

## 2022-03-03 DIAGNOSIS — R0981 Nasal congestion: Secondary | ICD-10-CM | POA: Insufficient documentation

## 2022-03-03 DIAGNOSIS — R059 Cough, unspecified: Secondary | ICD-10-CM | POA: Insufficient documentation

## 2022-03-03 DIAGNOSIS — J45909 Unspecified asthma, uncomplicated: Secondary | ICD-10-CM | POA: Insufficient documentation

## 2022-03-03 DIAGNOSIS — R079 Chest pain, unspecified: Secondary | ICD-10-CM | POA: Diagnosis not present

## 2022-03-03 NOTE — ED Triage Notes (Signed)
Pt states that her allergies have been bothering her and she is complaining of headache, ear pain, runny nose, sore throat, and chest pain x 3 days.

## 2022-03-04 ENCOUNTER — Emergency Department (HOSPITAL_COMMUNITY): Payer: Medicare Other

## 2022-03-04 DIAGNOSIS — R079 Chest pain, unspecified: Secondary | ICD-10-CM | POA: Diagnosis not present

## 2022-03-04 LAB — COMPREHENSIVE METABOLIC PANEL
ALT: 12 U/L (ref 0–44)
AST: 13 U/L — ABNORMAL LOW (ref 15–41)
Albumin: 3.8 g/dL (ref 3.5–5.0)
Alkaline Phosphatase: 55 U/L (ref 38–126)
Anion gap: 8 (ref 5–15)
BUN: 12 mg/dL (ref 6–20)
CO2: 27 mmol/L (ref 22–32)
Calcium: 8.5 mg/dL — ABNORMAL LOW (ref 8.9–10.3)
Chloride: 105 mmol/L (ref 98–111)
Creatinine, Ser: 0.8 mg/dL (ref 0.44–1.00)
GFR, Estimated: >60 mL/min (ref >60)
Glucose, Bld: 160 mg/dL — ABNORMAL HIGH (ref 70–99)
Potassium: 3.3 mmol/L — ABNORMAL LOW (ref 3.5–5.1)
Sodium: 140 mmol/L (ref 135–145)
Total Bilirubin: 0.8 mg/dL (ref 0.3–1.2)
Total Protein: 6.9 g/dL (ref 6.5–8.1)

## 2022-03-04 LAB — CBC WITH DIFFERENTIAL/PLATELET
Abs Immature Granulocytes: 0.01 10*3/uL (ref 0.00–0.07)
Basophils Absolute: 0.1 10*3/uL (ref 0.0–0.1)
Basophils Relative: 1 %
Eosinophils Absolute: 0.4 10*3/uL (ref 0.0–0.5)
Eosinophils Relative: 7 %
HCT: 28.3 % — ABNORMAL LOW (ref 36.0–46.0)
Hemoglobin: 8.5 g/dL — ABNORMAL LOW (ref 12.0–15.0)
Immature Granulocytes: 0 %
Lymphocytes Relative: 30 %
Lymphs Abs: 1.8 10*3/uL (ref 0.7–4.0)
MCH: 21.4 pg — ABNORMAL LOW (ref 26.0–34.0)
MCHC: 30 g/dL (ref 30.0–36.0)
MCV: 71.1 fL — ABNORMAL LOW (ref 80.0–100.0)
Monocytes Absolute: 0.7 10*3/uL (ref 0.1–1.0)
Monocytes Relative: 11 %
Neutro Abs: 3.1 10*3/uL (ref 1.7–7.7)
Neutrophils Relative %: 51 %
Platelets: 214 10*3/uL (ref 150–400)
RBC: 3.98 MIL/uL (ref 3.87–5.11)
RDW: 15.9 % — ABNORMAL HIGH (ref 11.5–15.5)
WBC: 6 10*3/uL (ref 4.0–10.5)
nRBC: 0 % (ref 0.0–0.2)

## 2022-03-04 LAB — RESP PANEL BY RT-PCR (FLU A&B, COVID) ARPGX2
Influenza A by PCR: NEGATIVE
Influenza B by PCR: NEGATIVE
SARS Coronavirus 2 by RT PCR: NEGATIVE

## 2022-03-04 MED ORDER — PREDNISONE 20 MG PO TABS: 20.0000 mg | ORAL_TABLET | Freq: Every day | ORAL | Status: DC

## 2022-03-04 MED ORDER — AZITHROMYCIN 250 MG PO TABS: 250.0000 mg | ORAL_TABLET | Freq: Every day | ORAL | 0 refills | Status: DC

## 2022-03-04 MED ORDER — AZITHROMYCIN 250 MG PO TABS
500.0000 mg | ORAL_TABLET | Freq: Once | ORAL | Status: AC
  Administered 2022-03-04: 500 mg via ORAL
  Filled 2022-03-04: qty 2

## 2022-03-04 MED ORDER — PREDNISONE 10 MG PO TABS: 20.0000 mg | ORAL_TABLET | Freq: Two times a day (BID) | ORAL | 0 refills | Status: DC

## 2022-03-04 NOTE — Discharge Instructions (Addendum)
Begin taking Zithromax as prescribed.  Begin taking prednisone as prescribed.  Continue use of your inhaler, 2 puffs every 4 hours as needed.  Follow-up with primary doctor if not improving in the next week.

## 2022-03-04 NOTE — ED Notes (Signed)
Save blue tube in main lab °

## 2022-03-04 NOTE — ED Provider Notes (Signed)
[facility]-EMERGENCY DEPT Provider Note   CSN: [id number] Arrival date & time: 03/03/22  2327     History  Chief Complaint  Patient presents with   Sore Throat   Nasal Congestion   Headache   Otalgia   Chest Pain    [facility] Black is a 36 y.o. female.  Patient is a 36 year old female with history of asthma and seasonal allergies.  Patient presenting today with complaints of nasal congestion, cough, wheezing, and facial pain.  She has history of sinus infections and this feels similar.  She denies fevers.  She has been using her inhaler and over-the-counter medications with little relief.  The history is provided by the patient.  Chest Pain     Home Medications Prior to Admission medications   Medication Sig Start Date End Date Taking? Authorizing Provider  albuterol (PROAIR HFA) 108 (90 Base) MCG/ACT inhaler 2 puffs every 4 hours as needed only  if your can't catch your breath Patient taking differently: Inhale 2 puffs into the lungs every 4 (four) hours as needed for shortness of breath. 06/28/21   Nyoka Cowden, MD  albuterol (PROVENTIL) (2.5 MG/3ML) 0.083% nebulizer solution Take 3 mLs (2.5 mg total) by nebulization every 4 (four) hours as needed for wheezing or shortness of breath. 02/24/22   Nyoka Cowden, MD  budesonide-formoterol (SYMBICORT) 160-4.5 MCG/ACT inhaler Inhale 2 puffs into the lungs 2 (two) times daily. 12/22/21   Nyoka Cowden, MD  Phenylephrine-APAP-guaiFENesin (TYLENOL SINUS SEVERE) 5-325-200 MG TABS Take 1 tablet by mouth daily as needed (For sinus).    [provider]  potassium chloride SA (KLOR-CON M) 20 MEQ tablet Take 1 tablet (20 mEq total) by mouth 2 (two) times daily for 2 days. 01/22/22 01/24/22  Achille Rich, PA-C  predniSONE (DELTASONE) 10 MG tablet Take  4 each am x 2 days,   2 each am x 2 days,  1 each am x 2 days and stop Patient not taking: Reported on 01/22/2022 12/22/21   Nyoka Cowden, MD      Allergies     Patient has no known allergies.    Review of Systems   Review of Systems  All other systems reviewed and are negative.  Physical Exam Updated Vital Signs BP 136/86 (BP Location: Left Arm)   Pulse 99   Temp 98.2 F (36.8 C) (Oral)   Resp 16   Ht 5\' 6"  (1.676 m)   Wt 65.8 kg   LMP  (LMP Unknown)   SpO2 100%   BMI 23.40 kg/m  Physical Exam Vitals and nursing note reviewed.  Constitutional:      General: She is not in acute distress.    Appearance: She is well-developed. She is not diaphoretic.  HENT:     Head: Normocephalic and atraumatic.     Comments: There is maxillofacial tenderness to palpation on exam.    Right Ear: Tympanic membrane normal.     Left Ear: Tympanic membrane normal.     Nose: Congestion present.     Mouth/Throat:     Tonsils: No tonsillar exudate or tonsillar abscesses.  Cardiovascular:     Rate and Rhythm: Normal rate and regular rhythm.     Heart sounds: No murmur heard.   No friction rub. No gallop.  Pulmonary:     Effort: Pulmonary effort is normal. No respiratory distress.     Breath sounds: Normal breath sounds. No wheezing.  Abdominal:     General: Bowel sounds  are normal. There is no distension.     Palpations: Abdomen is soft.     Tenderness: There is no abdominal tenderness.  Musculoskeletal:        General: Normal range of motion.     Cervical back: Normal range of motion and neck supple.  Skin:    General: Skin is warm and dry.  Neurological:     General: No focal deficit present.     Mental Status: She is alert and oriented to person, place, and time.    ED Results / Procedures / Treatments   Labs (all labs ordered are listed, but only abnormal results are displayed) Labs Reviewed  CBC WITH DIFFERENTIAL/PLATELET - Abnormal; Notable for the following components:      Result Value   Hemoglobin 8.5 (*)    HCT 28.3 (*)    MCV 71.1 (*)    MCH 21.4 (*)    RDW 15.9 (*)    All other components within normal limits   COMPREHENSIVE METABOLIC PANEL - Abnormal; Notable for the following components:   Potassium 3.3 (*)    Glucose, Bld 160 (*)    Calcium 8.5 (*)    AST 13 (*)    All other components within normal limits  RESP PANEL BY RT-PCR (FLU A&B, COVID) [id number]    EKG None  Radiology DG Chest Portable 1 View  Result Date: 03/04/2022 CLINICAL DATA:  Chest pain EXAM: PORTABLE CHEST 1 VIEW COMPARISON:  08/27/2021 FINDINGS: The heart size and mediastinal contours are within normal limits. Both lungs are clear. The visualized skeletal structures are unremarkable. IMPRESSION: No active disease. Electronically Signed   By: Helyn Numbers M.D.   On: 03/04/2022 00:17    Procedures Procedures    Medications Ordered in ED Medications  azithromycin (ZITHROMAX) tablet 500 mg (has no administration in time range)  predniSONE (DELTASONE) tablet 20 mg (has no administration in time range)    ED Course/ Medical Decision Making/ A&P  Patient presenting with facial pain, congestion, cough, consistent with a sinus infection.  This has been ongoing for the past week.  She has tried over-the-counter medications and her inhaler with little relief.  Patient will be prescribed prednisone and Zithromax.  Patient's oratory studies and chest x-ray are unremarkable.  Final Clinical Impression(s) / ED Diagnoses Final diagnoses:  None    Rx / DC Orders ED Discharge Orders     None         Geoffery Lyons, MD 03/04/22 [phone]

## 2022-03-22 ENCOUNTER — Emergency Department (HOSPITAL_COMMUNITY): Payer: Medicare Other

## 2022-03-22 ENCOUNTER — Encounter (HOSPITAL_COMMUNITY): Payer: Self-pay | Admitting: Emergency Medicine

## 2022-03-22 ENCOUNTER — Other Ambulatory Visit: Payer: Self-pay

## 2022-03-22 ENCOUNTER — Emergency Department (HOSPITAL_COMMUNITY)
Admission: EM | Admit: 2022-03-22 | Discharge: 2022-03-22 | Disposition: A | Discharge status: Home / Self Care | Payer: Medicare Other | Attending: Emergency Medicine | Admitting: Emergency Medicine

## 2022-03-22 DIAGNOSIS — J0100 Acute maxillary sinusitis, unspecified: Secondary | ICD-10-CM | POA: Insufficient documentation

## 2022-03-22 DIAGNOSIS — R42 Dizziness and giddiness: Secondary | ICD-10-CM | POA: Insufficient documentation

## 2022-03-22 DIAGNOSIS — R109 Unspecified abdominal pain: Secondary | ICD-10-CM | POA: Insufficient documentation

## 2022-03-22 DIAGNOSIS — D649 Anemia, unspecified: Secondary | ICD-10-CM | POA: Diagnosis not present

## 2022-03-22 DIAGNOSIS — R519 Headache, unspecified: Secondary | ICD-10-CM

## 2022-03-22 DIAGNOSIS — R200 Anesthesia of skin: Secondary | ICD-10-CM | POA: Diagnosis not present

## 2022-03-22 DIAGNOSIS — E876 Hypokalemia: Secondary | ICD-10-CM | POA: Diagnosis not present

## 2022-03-22 DIAGNOSIS — R531 Weakness: Secondary | ICD-10-CM | POA: Insufficient documentation

## 2022-03-22 DIAGNOSIS — R7309 Other abnormal glucose: Secondary | ICD-10-CM | POA: Diagnosis not present

## 2022-03-22 DIAGNOSIS — R202 Paresthesia of skin: Secondary | ICD-10-CM | POA: Diagnosis not present

## 2022-03-22 DIAGNOSIS — R0602 Shortness of breath: Secondary | ICD-10-CM | POA: Diagnosis not present

## 2022-03-22 LAB — URINALYSIS, ROUTINE W REFLEX MICROSCOPIC
Bilirubin Urine: NEGATIVE
Glucose, UA: NEGATIVE mg/dL
Hgb urine dipstick: NEGATIVE
Ketones, ur: 5 mg/dL — AB
Nitrite: NEGATIVE
Protein, ur: NEGATIVE mg/dL
Specific Gravity, Urine: 1.011 (ref 1.005–1.030)
pH: 7 (ref 5.0–8.0)

## 2022-03-22 LAB — CBC
HCT: 30.6 % — ABNORMAL LOW (ref 36.0–46.0)
Hemoglobin: 9.3 g/dL — ABNORMAL LOW (ref 12.0–15.0)
MCH: 21.2 pg — ABNORMAL LOW (ref 26.0–34.0)
MCHC: 30.4 g/dL (ref 30.0–36.0)
MCV: 69.7 fL — ABNORMAL LOW (ref 80.0–100.0)
Platelets: 205 10*3/uL (ref 150–400)
RBC: 4.39 MIL/uL (ref 3.87–5.11)
RDW: 16.2 % — ABNORMAL HIGH (ref 11.5–15.5)
WBC: 9.3 10*3/uL (ref 4.0–10.5)
nRBC: 0 % (ref 0.0–0.2)

## 2022-03-22 LAB — COMPREHENSIVE METABOLIC PANEL
ALT: 15 U/L (ref 0–44)
AST: 15 U/L (ref 15–41)
Albumin: 4.1 g/dL (ref 3.5–5.0)
Alkaline Phosphatase: 58 U/L (ref 38–126)
Anion gap: 8 (ref 5–15)
BUN: 12 mg/dL (ref 6–20)
CO2: 27 mmol/L (ref 22–32)
Calcium: 9.2 mg/dL (ref 8.9–10.3)
Chloride: 107 mmol/L (ref 98–111)
Creatinine, Ser: 0.84 mg/dL (ref 0.44–1.00)
GFR, Estimated: >60 mL/min (ref >60)
Glucose, Bld: 119 mg/dL — ABNORMAL HIGH (ref 70–99)
Potassium: 3.2 mmol/L — ABNORMAL LOW (ref 3.5–5.1)
Sodium: 142 mmol/L (ref 135–145)
Total Bilirubin: 1.1 mg/dL (ref 0.3–1.2)
Total Protein: 7.4 g/dL (ref 6.5–8.1)

## 2022-03-22 LAB — MAGNESIUM: Magnesium: 0.9 mg/dL — CL (ref 1.7–2.4)

## 2022-03-22 LAB — I-STAT BETA HCG BLOOD, ED (MC, WL, AP ONLY): I-stat hCG, quantitative: <5 m[IU]/mL (ref <5)

## 2022-03-22 LAB — LIPASE, BLOOD: Lipase: 29 U/L (ref 11–51)

## 2022-03-22 MED ORDER — DIPHENHYDRAMINE HCL 50 MG/ML IJ SOLN
25.0000 mg | Freq: Once | INTRAMUSCULAR | Status: AC
  Administered 2022-03-22: 25 mg via INTRAVENOUS
  Filled 2022-03-22: qty 1

## 2022-03-22 MED ORDER — POTASSIUM CHLORIDE CRYS ER 20 MEQ PO TBCR
40.0000 meq | EXTENDED_RELEASE_TABLET | Freq: Once | ORAL | Status: AC
Start: 2022-03-22 — End: 2022-03-22
  Administered 2022-03-22: 40 meq via ORAL
  Filled 2022-03-22: qty 2

## 2022-03-22 MED ORDER — SODIUM CHLORIDE 0.9 % IV BOLUS
1000.0000 mL | Freq: Once | INTRAVENOUS | Status: AC
  Administered 2022-03-22: 1000 mL via INTRAVENOUS

## 2022-03-22 MED ORDER — PROCHLORPERAZINE EDISYLATE 10 MG/2ML IJ SOLN
10.0000 mg | Freq: Once | INTRAMUSCULAR | Status: AC
  Administered 2022-03-22: 10 mg via INTRAVENOUS
  Filled 2022-03-22: qty 2

## 2022-03-22 MED ORDER — KETOROLAC TROMETHAMINE 30 MG/ML IJ SOLN
15.0000 mg | Freq: Once | INTRAMUSCULAR | Status: AC
  Administered 2022-03-22: 15 mg via INTRAVENOUS
  Filled 2022-03-22: qty 1

## 2022-03-22 NOTE — ED Triage Notes (Addendum)
Patient reports headaches, abdominal pain, dizziness, weakness, shortness of breath, mouth and hand numbness. Symptoms started around 1630 today. She reports dizziness does not change with positions. She believes may be experiencing the residual of a sinus infection. The patient has had episodes like this in the past when she became very dehydrated.

## 2022-03-22 NOTE — Discharge Instructions (Addendum)
Take Tylenol Motrin for pain.  Use Flonase, 1 spray each nostril, twice a day for 3 weeks.  Also use Afrin 1 spray each nostril twice a day for at least a week.  Try to get plenty of rest and drink a lot of fluids especially water.  Your potassium level was low so we will help to eat more foods which contain potassium.  See the attached information.  See the doctor of your choice if not better in a few days.

## 2022-03-22 NOTE — ED Provider Triage Note (Signed)
Emergency Medicine Provider Triage Evaluation Note  Lori Black , a 36 y.o. female  was evaluated in triage.  Pt complains of multiple complaints.  States today she developed headache, lightheadedness, weakness.  Felt she was tingling on her right side.  Had some shortness of breath earlier today.  Headache began when she woke up this morning.  No current difficulty speaking, numbness or weakness.  Review of Systems  Positive: Headache, shortness of breath, lightheadedness Negative: Chest pain, cough  Physical Exam  BP 127/89   Pulse 83   Temp 98.6 F (37 C) (Oral)   Resp 17   Ht 5\' 6"  (1.676 m)   Wt 68 kg   LMP  (LMP Unknown)   BMI 24.21 kg/m  Gen:   Awake, no distress   Resp:  Normal effort  MSK:   Moves extremities without difficulty  Other:    Medical Decision Making  Medically screening exam initiated at 9:08 PM.  Appropriate orders placed.  Lori Black was informed that the remainder of the evaluation will be completed by another provider, this initial triage assessment does not replace that evaluation, and the importance of remaining in the ED until their evaluation is complete.  Headache, lightheadedness, SOB, abd pain   [facility] A, PA-C 03/22/22 2110

## 2022-03-22 NOTE — ED Provider Notes (Signed)
[facility]-EMERGENCY DEPT Provider Note   CSN: [id number] Arrival date & time: 03/22/22  2029     History {Add pertinent medical, surgical, social history, OB history to HPI:1} Chief Complaint  Patient presents with   Shortness of Breath   Dizziness   Weakness   Abdominal Pain   Headache    Lori Black is a 36 y.o. female.  HPI Patient presenting for evaluation of headache and lightheadedness.  She has had some tingling in the right side of her body.  She reports having shortness of breath today.  Headache has been ongoing since arising today.  Recently in the ED and treated with Zithromax for "sinusitis."  She did not have imaging at that time.  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    Home Medications Prior to Admission medications   Medication Sig Start Date End Date Taking? Authorizing Provider  albuterol (PROAIR HFA) 108 (90 Base) MCG/ACT inhaler 2 puffs every 4 hours as needed only  if your can't catch your breath Patient taking differently: Inhale 2 puffs into the lungs every 4 (four) hours as needed for shortness of breath. 06/28/21   Nyoka Cowden, MD  albuterol (PROVENTIL) (2.5 MG/3ML) 0.083% nebulizer solution Take 3 mLs (2.5 mg total) by nebulization every 4 (four) hours as needed for wheezing or shortness of breath. 02/24/22   Nyoka Cowden, MD  azithromycin (ZITHROMAX) 250 MG tablet Take 1 tablet (250 mg total) by mouth daily. 03/04/22   Geoffery Lyons, MD  budesonide-formoterol (SYMBICORT) 160-4.5 MCG/ACT inhaler Inhale 2 puffs into the lungs 2 (two) times daily. 12/22/21   Nyoka Cowden, MD  Phenylephrine-APAP-guaiFENesin (TYLENOL SINUS SEVERE) 5-325-200 MG TABS Take 1 tablet by mouth daily as needed (For sinus).    [provider]  potassium chloride SA (KLOR-CON M) 20 MEQ tablet Take 1 tablet (20 mEq total) by mouth 2 (two) times daily for 2 days. 01/22/22 01/24/22  Achille Rich, PA-C  predniSONE (DELTASONE) 10 MG tablet Take 2  tablets (20 mg total) by mouth 2 (two) times daily with a meal. 03/04/22   Geoffery Lyons, MD      Allergies    Patient has no known allergies.    Review of Systems   Review of Systems  Physical Exam Updated Vital Signs BP 134/85   Pulse 86   Temp 98.6 F (37 C) (Oral)   Resp 19   Ht 5\' 6"  (1.676 m)   Wt 68 kg   LMP 03/09/2022 (Approximate) Comment: pt states tubal ligation  SpO2 98%   BMI 24.21 kg/m  Physical Exam Vitals and nursing note reviewed.  Constitutional:      General: She is in acute distress (She appears uncomfortable).     Appearance: She is well-developed. She is not ill-appearing.  HENT:     Head: Normocephalic and atraumatic.     Right Ear: External ear normal.     Left Ear: External ear normal.  Eyes:     Conjunctiva/sclera: Conjunctivae normal.     Pupils: Pupils are equal, round, and reactive to light.  Neck:     Trachea: Phonation normal.  Cardiovascular:     Rate and Rhythm: Normal rate and regular rhythm.  Pulmonary:     Effort: Pulmonary effort is normal. No respiratory distress.     Breath sounds: No stridor.  Abdominal:     General: There is no distension.     Palpations: Abdomen is soft.  Musculoskeletal:  General: Normal range of motion.     Cervical back: Normal range of motion and neck supple.  Skin:    General: Skin is warm and dry.  Neurological:     Mental Status: She is alert and oriented to person, place, and time.     Cranial Nerves: No cranial nerve deficit.     Sensory: No sensory deficit.     Motor: No abnormal muscle tone.     Coordination: Coordination normal.     Comments: No dysarthria or aphasia.  No nystagmus.  No ataxia.  Psychiatric:        Mood and Affect: Mood normal.        Behavior: Behavior normal.        Thought Content: Thought content normal.        Judgment: Judgment normal.    ED Results / Procedures / Treatments   Labs (all labs ordered are listed, but only abnormal results are  displayed) Labs Reviewed  LIPASE, BLOOD  COMPREHENSIVE METABOLIC PANEL  CBC  URINALYSIS, ROUTINE W REFLEX MICROSCOPIC  I-STAT BETA HCG BLOOD, ED ([facility], [facility], [facility] ONLY)    EKG None  Radiology DG Chest 2 View  Result Date: 03/22/2022 CLINICAL DATA:  Shortness of breath EXAM: CHEST - 2 VIEW COMPARISON:  03/04/2022 FINDINGS: The heart size and mediastinal contours are within normal limits. Both lungs are clear. The visualized skeletal structures are unremarkable. IMPRESSION: No active cardiopulmonary disease. Electronically Signed   By: Deatra Robinson M.D.   On: 03/22/2022 21:31   CT HEAD WO CONTRAST ( )  Result Date: 03/22/2022 CLINICAL DATA:  Dizziness with mild and hand numbness. EXAM: CT HEAD WITHOUT CONTRAST TECHNIQUE: Contiguous axial images were obtained from the base of the skull through the vertex without intravenous contrast. RADIATION DOSE REDUCTION: This exam was performed according to the departmental dose-optimization program which includes automated exposure control, adjustment of the mA and/or kV according to patient size and/or use of iterative reconstruction technique. COMPARISON:  None Available. FINDINGS: Brain: No evidence of acute infarction, hemorrhage, hydrocephalus, extra-axial collection or mass lesion/mass effect. Vascular: No hyperdense vessel or unexpected calcification. Skull: Normal. Negative for fracture or focal lesion. Sinuses/Orbits: There is mild right maxillary sinus and mild bilateral ethmoid sinus mucosal thickening. Moderate to marked severity bilateral nasal mucosal thickening is also seen. Other: None. IMPRESSION: 1. No acute intracranial abnormality. 2. Moderate to marked severity bilateral nasal mucosal thickening. 3. Mild right maxillary sinus and mild bilateral ethmoid sinus disease. Electronically Signed   By: Aram Candela M.D.   On: 03/22/2022 21:23    Procedures Procedures  {Document cardiac monitor, telemetry assessment procedure when  appropriate:1}  Medications Ordered in ED Medications  sodium chloride 0.9 % bolus 1,000 mL (has no administration in time range)  prochlorperazine (COMPAZINE) injection 10 mg (has no administration in time range)  diphenhydrAMINE (BENADRYL) injection 25 mg (has no administration in time range)  ketorolac (TORADOL) 30 MG/ML injection 15 mg (has no administration in time range)    ED Course/ Medical Decision Making/ A&P                           Medical Decision Making Amount and/or Complexity of Data Reviewed Labs: ordered.   ***  {Document critical care time when appropriate:1} {Document review of labs and clinical decision tools ie heart score, Chads2Vasc2 etc:1}  {Document your independent review of radiology images, and any outside records:1} {Document your discussion with family members,  caretakers, and with consultants:1} {Document social determinants of health affecting pt's care:1} {Document your decision making why or why not admission, treatments were needed:1} Final Clinical Impression(s) / ED Diagnoses Final diagnoses:  None    Rx / DC Orders ED Discharge Orders     None

## 2022-04-18 ENCOUNTER — Telehealth: Payer: Self-pay | Admitting: Internal Medicine

## 2022-04-18 NOTE — Telephone Encounter (Signed)
Received a fax from [employer] requesting office notes from 05/13/2020 to the present.  I have faxed notes from 05/13/20, 12/28/20, 06/28/21, and 12/22/21.  Fax# [phone]

## 2022-05-11 ENCOUNTER — Emergency Department (HOSPITAL_COMMUNITY)
Admission: EM | Admit: 2022-05-11 | Discharge: 2022-05-12 | Discharge status: Against Medical Advice | Payer: Medicare Other | Attending: Emergency Medicine | Admitting: Emergency Medicine

## 2022-05-11 ENCOUNTER — Other Ambulatory Visit: Payer: Self-pay

## 2022-05-11 ENCOUNTER — Encounter (HOSPITAL_COMMUNITY): Payer: Self-pay

## 2022-05-11 DIAGNOSIS — M25461 Effusion, right knee: Secondary | ICD-10-CM | POA: Diagnosis not present

## 2022-05-11 DIAGNOSIS — E876 Hypokalemia: Secondary | ICD-10-CM | POA: Diagnosis not present

## 2022-05-11 DIAGNOSIS — D72829 Elevated white blood cell count, unspecified: Secondary | ICD-10-CM | POA: Diagnosis not present

## 2022-05-11 DIAGNOSIS — R000 Tachycardia, unspecified: Secondary | ICD-10-CM | POA: Diagnosis not present

## 2022-05-11 DIAGNOSIS — M25561 Pain in right knee: Secondary | ICD-10-CM | POA: Diagnosis present

## 2022-05-11 NOTE — ED Triage Notes (Signed)
Right knee pain and swelling x2 days with decreased ROM. Patient reports 2 months ago fluid was drained off right knee

## 2022-05-12 LAB — BASIC METABOLIC PANEL
Anion gap: 10 (ref 5–15)
BUN: 17 mg/dL (ref 6–20)
CO2: 27 mmol/L (ref 22–32)
Calcium: 8.7 mg/dL — ABNORMAL LOW (ref 8.9–10.3)
Chloride: 103 mmol/L (ref 98–111)
Creatinine, Ser: 1.08 mg/dL — ABNORMAL HIGH (ref 0.44–1.00)
GFR, Estimated: >60 mL/min (ref >60)
Glucose, Bld: 105 mg/dL — ABNORMAL HIGH (ref 70–99)
Potassium: 3.2 mmol/L — ABNORMAL LOW (ref 3.5–5.1)
Sodium: 140 mmol/L (ref 135–145)

## 2022-05-12 LAB — CBC WITH DIFFERENTIAL/PLATELET
Abs Immature Granulocytes: 0.03 10*3/uL (ref 0.00–0.07)
Basophils Absolute: 0.1 10*3/uL (ref 0.0–0.1)
Basophils Relative: 1 %
Eosinophils Absolute: 0.1 10*3/uL (ref 0.0–0.5)
Eosinophils Relative: 1 %
HCT: 30 % — ABNORMAL LOW (ref 36.0–46.0)
Hemoglobin: 9.1 g/dL — ABNORMAL LOW (ref 12.0–15.0)
Immature Granulocytes: 0 %
Lymphocytes Relative: 19 %
Lymphs Abs: 2.1 10*3/uL (ref 0.7–4.0)
MCH: 21.2 pg — ABNORMAL LOW (ref 26.0–34.0)
MCHC: 30.3 g/dL (ref 30.0–36.0)
MCV: 69.8 fL — ABNORMAL LOW (ref 80.0–100.0)
Monocytes Absolute: 0.9 10*3/uL (ref 0.1–1.0)
Monocytes Relative: 8 %
Neutro Abs: 7.8 10*3/uL — ABNORMAL HIGH (ref 1.7–7.7)
Neutrophils Relative %: 71 %
Platelets: 240 10*3/uL (ref 150–400)
RBC: 4.3 MIL/uL (ref 3.87–5.11)
RDW: 16.7 % — ABNORMAL HIGH (ref 11.5–15.5)
WBC: 10.9 10*3/uL — ABNORMAL HIGH (ref 4.0–10.5)
nRBC: 0 % (ref 0.0–0.2)

## 2022-05-12 LAB — I-STAT BETA HCG BLOOD, ED (MC, WL, AP ONLY): I-stat hCG, quantitative: <5 m[IU]/mL (ref <5)

## 2022-05-12 LAB — SYNOVIAL CELL COUNT + DIFF, W/ CRYSTALS
Crystals, Fluid: NONE SEEN
Lymphocytes-Synovial Fld: 4 % (ref 0–20)
Monocyte-Macrophage-Synovial Fluid: 4 % — ABNORMAL LOW (ref 50–90)
Neutrophil, Synovial: 92 % — ABNORMAL HIGH (ref 0–25)
WBC, Synovial: 2760 /mm3 — ABNORMAL HIGH (ref 0–200)

## 2022-05-12 MED ORDER — KETOROLAC TROMETHAMINE 30 MG/ML IJ SOLN
30.0000 mg | Freq: Once | INTRAMUSCULAR | Status: AC
  Administered 2022-05-12: 30 mg via INTRAMUSCULAR
  Filled 2022-05-12: qty 1

## 2022-05-12 MED ORDER — LIDOCAINE HCL 2 % IJ SOLN
10.0000 mL | Freq: Once | INTRAMUSCULAR | Status: AC
Start: 2022-05-12 — End: 2022-05-12
  Administered 2022-05-12: 200 mg
  Filled 2022-05-12: qty 20

## 2022-05-12 NOTE — ED Provider Notes (Signed)
[facility]-EMERGENCY DEPT Provider Note   CSN: [id number] Arrival date & time: 05/11/22  2329     History  Chief Complaint  Patient presents with   Knee Pain    Lori Black is a 36 y.o. female.  The history is provided by the patient.  Knee Pain Lori Black is a 36 y.o. female who presents to the Emergency Department complaining of knee pain.  She presents to the emergency department for evaluation of right knee pain and swelling that started about 2 days ago.  She is not able to fully range the right knee.  No reports of injuries.  2 years ago she had a similar episode that involved both knees and had septic arthritis that required washout in the OR.  Since then she did have one recurrent episode 2 months ago that required drainage but was not infected.  She reports feeling like her heart rate is a little high but otherwise no fevers, nausea, vomiting.  No regular medications.  No IV drug use.      Home Medications Prior to Admission medications   Medication Sig Start Date End Date Taking? Authorizing Provider  albuterol (PROAIR HFA) 108 (90 Base) MCG/ACT inhaler 2 puffs every 4 hours as needed only  if your can't catch your breath Patient taking differently: Inhale 2 puffs into the lungs every 4 (four) hours as needed for shortness of breath. 06/28/21  Yes Nyoka Cowden, MD  albuterol (PROVENTIL) (2.5 MG/3ML) 0.083% nebulizer solution Take 3 mLs (2.5 mg total) by nebulization every 4 (four) hours as needed for wheezing or shortness of breath. 02/24/22  Yes Nyoka Cowden, MD  budesonide-formoterol (SYMBICORT) 160-4.5 MCG/ACT inhaler Inhale 2 puffs into the lungs 2 (two) times daily. 12/22/21  Yes Nyoka Cowden, MD  Phenylephrine-APAP-guaiFENesin (TYLENOL SINUS SEVERE) 5-325-200 MG TABS Take 1 tablet by mouth daily as needed (For sinus).   Yes [provider]  azithromycin (ZITHROMAX) 250 MG tablet Take 1 tablet (250 mg total) by mouth daily. Patient  not taking: Reported on 05/12/2022 03/04/22   Geoffery Lyons, MD  potassium chloride SA (KLOR-CON M) 20 MEQ tablet Take 1 tablet (20 mEq total) by mouth 2 (two) times daily for 2 days. 01/22/22 01/24/22  Achille Rich, PA-C  predniSONE (DELTASONE) 10 MG tablet Take 2 tablets (20 mg total) by mouth 2 (two) times daily with a meal. Patient not taking: Reported on 05/12/2022 03/04/22   Geoffery Lyons, MD      Allergies    Patient has no known allergies.    Review of Systems   Review of Systems  All other systems reviewed and are negative.   Physical Exam Updated Vital Signs BP 123/85   Pulse 98   Temp 98.5 F (36.9 C) (Oral)   Resp 18   Ht 5\' 6"  (1.676 m)   Wt 63.5 kg   LMP 04/20/2022   SpO2 100%   BMI 22.60 kg/m  Physical Exam Vitals and nursing note reviewed.  Constitutional:      Appearance: She is well-developed.  HENT:     Head: Normocephalic and atraumatic.  Cardiovascular:     Rate and Rhythm: Regular rhythm. Tachycardia present.     Heart sounds: No murmur heard. Pulmonary:     Effort: Pulmonary effort is normal. No respiratory distress.     Breath sounds: Normal breath sounds.  Abdominal:     Palpations: Abdomen is soft.     Tenderness: There is no abdominal tenderness. There  is no guarding or rebound.  Musculoskeletal:     Comments: There is mild to moderate swelling to the right knee with mild local warmth without overlying erythema.  She is able to flex the knee to 90 degrees but does have pain on flexion, less so on extension.  2+ DP pulses bilaterally  Skin:    General: Skin is warm and dry.  Neurological:     Mental Status: She is alert and oriented to person, place, and time.  Psychiatric:        Behavior: Behavior normal.     ED Results / Procedures / Treatments   Labs (all labs ordered are listed, but only abnormal results are displayed) Labs Reviewed  BASIC METABOLIC PANEL - Abnormal; Notable for the following components:      Result Value   Potassium  3.2 (*)    Glucose, Bld 105 (*)    Creatinine, Ser 1.08 (*)    Calcium 8.7 (*)    All other components within normal limits  CBC WITH DIFFERENTIAL/PLATELET - Abnormal; Notable for the following components:   WBC 10.9 (*)    Hemoglobin 9.1 (*)    HCT 30.0 (*)    MCV 69.8 (*)    MCH 21.2 (*)    RDW 16.7 (*)    Neutro Abs 7.8 (*)    All other components within normal limits  SYNOVIAL CELL COUNT + DIFF, W/ CRYSTALS - Abnormal; Notable for the following components:   Color, Synovial RED (*)    Appearance-Synovial CLOUDY (*)    WBC, Synovial 2,760 (*)    Neutrophil, Synovial 92 (*)    Monocyte-Macrophage-Synovial Fluid 4 (*)    All other components within normal limits  BODY FLUID CULTURE W GRAM STAIN  GLUCOSE, BODY FLUID OTHER            I-STAT BETA HCG BLOOD, ED ([facility], [facility], [facility] ONLY)    EKG EKG Interpretation  Date/Time:  Thursday May 12 2022 00:03:23 EDT Ventricular Rate:  101 PR Interval:  145 QRS Duration: 94 QT Interval:  334 QTC Calculation: 433 R Axis:   -87 Text Interpretation: Sinus tachycardia Left anterior fascicular block Abnormal R-wave progression, late transition Borderline T abnormalities, anterior leads Confirmed by Tilden Fossa [phone]) on 05/12/2022 1:48:18 AM  Radiology No results found.  Procedures .Joint Aspiration/Arthrocentesis  Date/Time: 05/12/2022 1:34 AM  Performed by: Tilden Fossa, MD Authorized by: Tilden Fossa, MD   Consent:    Consent obtained:  Verbal   Consent given by:  Patient   Risks discussed:  Bleeding, infection, pain and incomplete drainage Universal protocol:    Patient identity confirmed:  Verbally with patient Location:    Location:  Knee   Knee:  R knee Anesthesia:    Anesthesia method:  Local infiltration   Local anesthetic:  Lidocaine 1% w/o epi Procedure details:    Preparation: Patient was prepped and draped in usual sterile fashion     Needle gauge:  18 G   Ultrasound guidance: no     Approach:  Lateral    Aspirate amount:  15   Aspirate characteristics:  Blood-tinged   Steroid injected: no     Specimen collected: yes   Post-procedure details:    Dressing:  Gauze roll   Procedure completion:  Tolerated well, no immediate complications     Medications Ordered in ED Medications  lidocaine (XYLOCAINE) 2 % (with pres) injection 200 mg (200 mg Other Given During Downtime 05/12/22 0050)  ketorolac (TORADOL) 30  MG/ML injection 30 mg (30 mg Intramuscular Given 05/12/22 2591)    ED Course/ Medical Decision Making/ A&P                           Medical Decision Making Amount and/or Complexity of Data Reviewed Labs: ordered.  Risk Prescription drug management.   Patient with history of septic arthritis here for evaluation of knee pain and swelling.  She does have swelling without significant tenderness to the right knee.  She is mildly tachycardic on ED presentation.  CBC with mild leukocytosis.  Given patient's history of septic arthritis recommend arthrocentesis to rule out recurrent infection.  During ED stay patient requests discharge due to need for childcare.  Patient at point of reassessment has some of her synovial fluid analysis available with reassuring cell count but Gram stain has not resulted.  Discussed without Gram stain result do not feel comfortable with discharge at this time as she could still have a positive Gram stain.  Patient acknowledges these risks and wishes to be discharged at this time.  Discussed recommendation to follow-up with her PCP/orthopedics.  Discussed return precautions.  Recommend OTC analgesics for pain.  Also discussed with patient findings of hypokalemia and borderline creatinine that will need to be followed up by her family doctor.        Final Clinical Impression(s) / ED Diagnoses Final diagnoses:  Effusion of right knee  Hypokalemia    Rx / DC Orders ED Discharge Orders     None         Tilden Fossa, MD 05/12/22 [phone]

## 2022-05-13 LAB — GLUCOSE, BODY FLUID OTHER: Glucose, Body Fluid Other: 68 mg/dL

## 2022-05-15 LAB — BODY FLUID CULTURE W GRAM STAIN
Culture: NO GROWTH
Gram Stain: NONE SEEN

## 2022-07-08 ENCOUNTER — Telehealth: Payer: Self-pay | Admitting: *Deleted

## 2022-07-08 DIAGNOSIS — U071 COVID-19: Secondary | ICD-10-CM | POA: Diagnosis not present

## 2022-07-08 NOTE — Chronic Care Management (AMB) (Unsigned)
  Care Coordination  Outreach Note  07/08/2022 Name: Trichelle Lehan MRN: [id number] DOB: [birth year]   Care Coordination Outreach Attempts: An unsuccessful telephone outreach was attempted today to offer the patient information about available care coordination services as a benefit of their health plan.   Follow Up Plan:  Additional outreach attempts will be made to offer the patient care coordination information and services.   Encounter Outcome:  No Answer  [place]  Direct Dial: [phone]

## 2022-07-11 NOTE — Chronic Care Management (AMB) (Signed)
  Care Coordination   Note   07/11/2022 Name: Lori Black MRN: [id number] DOB: 05/09/1986  Lori Black is a 36 y.o. year old female who sees Wert, Christena Deem, MD for primary care. I reached out to [employer] by phone today to offer care coordination services.  Ms. Carriveau was given information about Care Coordination services today including:   The Care Coordination services include support from the care team which includes your Nurse Coordinator, Clinical Social Worker, or Pharmacist.  The Care Coordination team is here to help remove barriers to the health concerns and goals most important to you. Care Coordination services are voluntary, and the patient may decline or stop services at any time by request to their care team member.   Care Coordination Consent Status: Patient agreed to services and verbal consent obtained.   Follow up plan:  Telephone appointment with care coordination team member scheduled for:  07/19/25  Encounter Outcome:  Pt. Scheduled  [place]  Direct Dial: [phone]

## 2022-07-19 ENCOUNTER — Ambulatory Visit: Payer: Self-pay

## 2022-07-19 NOTE — Patient Outreach (Signed)
  Care Coordination   07/19/2022 Name: Lori Black MRN: [id number] DOB: 01/04/1986   Care Coordination Outreach Attempts:  An unsuccessful telephone outreach was attempted today to offer the patient information about available care coordination services as a benefit of their health plan.   Follow Up Plan:  Additional outreach attempts will be made to offer the patient care coordination information and services.   Encounter Outcome:  No Answer  Care Coordination Interventions Activated:  No   Care Coordination Interventions:  No, not indicated    Lazaro Arms RN, BSN, [facility]   Phone: [phone]

## 2022-07-28 ENCOUNTER — Telehealth: Payer: Self-pay | Admitting: *Deleted

## 2022-07-28 NOTE — Chronic Care Management (AMB) (Signed)
  Care Coordination Note  07/28/2022 Name: Lori Black MRN: [id number] DOB: 17-Apr-1986  Lori Black is a 36 y.o. year old female who is a primary care patient of Tanda Rockers, MD and is actively engaged with the care management team. I reached out to [employer] by phone today to assist with re-scheduling an initial visit with the RN Case Manager  Follow up plan: Unsuccessful telephone outreach attempt made. A HIPAA compliant phone message was left for the patient providing contact information and requesting a return call.   [place]  Direct Dial: [phone]

## 2022-07-28 NOTE — Chronic Care Management (AMB) (Signed)
  Care Coordination   Note   07/28/2022 Name: Lori Black MRN: [id number] DOB: 05/08/1986  Lori Black is a 36 y.o. year old female who sees Wert, Christena Deem, MD for primary care. I reached out to [employer] by phone today to rescheudle care coordination services.   Follow up plan:  Unsuccessful telephone outreach attempt made. A HIPAA compliant phone message was left for the patient providing contact information and requesting a return call.  Encounter Outcome:  No Answer  [place]  Direct Dial: [phone]

## 2022-07-31 ENCOUNTER — Encounter (HOSPITAL_COMMUNITY): Payer: Self-pay | Admitting: Emergency Medicine

## 2022-07-31 ENCOUNTER — Emergency Department (HOSPITAL_COMMUNITY)
Admission: EM | Admit: 2022-07-31 | Discharge: 2022-07-31 | Discharge status: Against Medical Advice | Payer: Medicare Other | Attending: Emergency Medicine | Admitting: Emergency Medicine

## 2022-07-31 ENCOUNTER — Emergency Department (HOSPITAL_COMMUNITY): Payer: Medicare Other

## 2022-07-31 ENCOUNTER — Other Ambulatory Visit: Payer: Self-pay

## 2022-07-31 DIAGNOSIS — J45901 Unspecified asthma with (acute) exacerbation: Secondary | ICD-10-CM | POA: Diagnosis not present

## 2022-07-31 DIAGNOSIS — Z5321 Procedure and treatment not carried out due to patient leaving prior to being seen by health care provider: Secondary | ICD-10-CM | POA: Diagnosis not present

## 2022-07-31 DIAGNOSIS — R0602 Shortness of breath: Secondary | ICD-10-CM | POA: Diagnosis not present

## 2022-07-31 DIAGNOSIS — R0789 Other chest pain: Secondary | ICD-10-CM | POA: Diagnosis not present

## 2022-07-31 LAB — BASIC METABOLIC PANEL
Anion gap: 6 (ref 5–15)
BUN: 13 mg/dL (ref 6–20)
CO2: 28 mmol/L (ref 22–32)
Calcium: 8.6 mg/dL — ABNORMAL LOW (ref 8.9–10.3)
Chloride: 103 mmol/L (ref 98–111)
Creatinine, Ser: 0.8 mg/dL (ref 0.44–1.00)
GFR, Estimated: >60 mL/min (ref >60)
Glucose, Bld: 89 mg/dL (ref 70–99)
Potassium: 3.4 mmol/L — ABNORMAL LOW (ref 3.5–5.1)
Sodium: 137 mmol/L (ref 135–145)

## 2022-07-31 LAB — CBC
HCT: 28.3 % — ABNORMAL LOW (ref 36.0–46.0)
Hemoglobin: 8.5 g/dL — ABNORMAL LOW (ref 12.0–15.0)
MCH: 20.9 pg — ABNORMAL LOW (ref 26.0–34.0)
MCHC: 30 g/dL (ref 30.0–36.0)
MCV: 69.5 fL — ABNORMAL LOW (ref 80.0–100.0)
Platelets: 270 10*3/uL (ref 150–400)
RBC: 4.07 MIL/uL (ref 3.87–5.11)
RDW: 16.2 % — ABNORMAL HIGH (ref 11.5–15.5)
WBC: 6.3 10*3/uL (ref 4.0–10.5)
nRBC: 0 % (ref 0.0–0.2)

## 2022-07-31 LAB — I-STAT BETA HCG BLOOD, ED (MC, WL, AP ONLY): I-stat hCG, quantitative: <5 m[IU]/mL (ref <5)

## 2022-07-31 LAB — TROPONIN I (HIGH SENSITIVITY)
Troponin I (High Sensitivity): <2 ng/L (ref <18)
Troponin I (High Sensitivity): <2 ng/L (ref <18)

## 2022-07-31 MED ORDER — ALBUTEROL SULFATE HFA 108 (90 BASE) MCG/ACT IN AERS: 2.0000 | INHALATION_SPRAY | RESPIRATORY_TRACT | Status: DC | PRN

## 2022-07-31 MED ORDER — IPRATROPIUM-ALBUTEROL 0.5-2.5 (3) MG/3ML IN SOLN: 3.0000 mL | Freq: Once | RESPIRATORY_TRACT | Status: DC

## 2022-07-31 NOTE — ED Provider Triage Note (Signed)
Emergency Medicine Provider Triage Evaluation Note  Lori Black , a 36 y.o. female  was evaluated in triage.  Pt complains of SOB and chest pain for the past three days. The patient is an asthmatic and has been out of her maintenance inhaler and thinks this is what is causing her exacerbation. She reports she has tried her rescue and neb with minimal relief.  Review of Systems  Positive:  Negative:   Physical Exam  BP 136/81 (BP Location: Left Arm)   Pulse (!) 109   Temp 98.9 F (37.2 C) (Oral)   Resp 20   Ht 5\' 6"  (1.676 m)   Wt 65.8 kg   SpO2 100%   BMI 23.40 kg/m  Gen:   Awake, no distress   Resp:  Normal effort  MSK:   Moves extremities without difficulty  Other:  Decreased breath sounds throughout with fine wheezing. NAD. No tripoding or accessory muscle use.  Medical Decision Making  Medically screening exam initiated at 12:55 PM.  Appropriate orders placed.  Aaira Kooy was informed that the remainder of the evaluation will be completed by another provider, this initial triage assessment does not replace that evaluation, and the importance of remaining in the ED until their evaluation is complete.  Labs and duo neb ordered.    Sherrell Puller, PA-C 07/31/22 1256

## 2022-07-31 NOTE — ED Triage Notes (Signed)
Pt via POV from home c/o asthma exacerbation not improved after multiple neb treatments and inhaler uses at home, also with productive yellow cough. Pt also has left ear pain. Pain currently rated 5/10 in left ear, increases to 10/10 when coughing or swallowing.

## 2022-08-01 NOTE — Chronic Care Management (AMB) (Signed)
  Care Coordination Note  08/01/2022 Name: Lori Black MRN: [id number] DOB: May 20, 1986  Lori Black is a 36 y.o. year old female who is a primary care patient of Tanda Rockers, MD and is actively engaged with the care management team. I reached out to [place] by phone today to assist with re-scheduling an initial visit with the RN Case Manager  Follow up plan: Telephone appointment with care management team member scheduled for:08/09/22  [place]: [phone]

## 2022-08-02 ENCOUNTER — Encounter: Payer: Self-pay | Admitting: Internal Medicine

## 2022-08-02 ENCOUNTER — Ambulatory Visit (INDEPENDENT_AMBULATORY_CARE_PROVIDER_SITE_OTHER): Payer: Medicare Other | Admitting: Internal Medicine

## 2022-08-02 DIAGNOSIS — J4530 Mild persistent asthma, uncomplicated: Secondary | ICD-10-CM

## 2022-08-02 MED ORDER — BUDESONIDE-FORMOTEROL FUMARATE 160-4.5 MCG/ACT IN AERO
2.0000 | INHALATION_SPRAY | Freq: Two times a day (BID) | RESPIRATORY_TRACT | 3 refills | Status: DC
Start: 2022-08-02 — End: 2022-08-03

## 2022-08-02 MED ORDER — PREDNISONE 10 MG PO TABS: ORAL_TABLET | ORAL | 0 refills | Status: DC

## 2022-08-02 MED ORDER — BREZTRI AEROSPHERE 160-9-4.8 MCG/ACT IN AERO: 2.0000 | INHALATION_SPRAY | Freq: Two times a day (BID) | RESPIRATORY_TRACT | 0 refills | Status: DC

## 2022-08-02 NOTE — Patient Instructions (Signed)
Plan A = Automatic = Always=    Symbicort 160 (Breztri) Take 2 puffs first thing in am and then another 2 puffs about 12 hours later.     Plan B = Backup (to supplement plan A, not to replace it) Only use your albuterol inhaler as a rescue medication to be used if you can't catch your breath by resting or doing a relaxed purse lip breathing pattern.  - The less you use it, the better it will work when you need it. - Ok to use the inhaler up to 2 puffs  every 4 hours if you must but call for appointment if use goes up over your usual need - Don't leave home without it !!  (think of it like the spare tire for your car)   Plan C = Crisis (instead of Plan B but only if Plan B stops working) - only use your albuterol nebulizer if you first try Plan B and it fails to help > ok to use the nebulizer up to every 4 hours but if start needing it regularly call for immediate appointment   Plan D = Deltasone = Prednisone 10 mg 2 daily until better then 1 daily x 5 days and stop  Plan E  = ER  go there if all else fails   Please schedule a follow up visit in 3 months but call sooner if needed

## 2022-08-02 NOTE — Progress Notes (Signed)
Subjective:     Patient ID: Lori Black, female   DOB: 1986/08/09    MRN: [id number]  Brief patient profile:  35  yowf never smoker asthma since childhood never free of symptoms or need for inhaler and frequent prednisone rx x every few months maint by [facility] doctor on symbicort fall 2017 with no change pattern > referred to pulmonary clinic 03/17/2017 by EDP p 3rd ER trip in 6 months with one admit for asthma        History of Present Illness  03/17/2017 1st [place] [facility] office visit/ Lori Black   Chief Complaint  Patient presents with   Pulmonary Consult    Self referral. Pt states that she was dxed with Asthma at birth. She recently moved here from [facility]. She has been out of her Symbicort for the past month. She has been having increased SOB and cough with thick, yellow sputum recently.  She has been using albuterol neb and albuterol inhaler at least once per day.   just took last prednisone on day of ov Out of symb x sev weeks   But when on symb and pred only needs neb once a day  Has assoc nasal congestions and rattling cough worse at hs and in AM Baseline hfa very poor - see hfa  rec  Plan A = Automatic = symbicort 160 Take 2 puffs first thing in am and then another 2 puffs about 12 hours later.  Work on inhaler technique:   Plan B = Backup Only use your albuterol as a rescue medication     12/22/2021  f/u ov/Lori Black re: asthma   maint on symbicort 160   / last prednisone was one month prior to OV , has not been back to allergy yet  Chief Complaint  Patient presents with   Follow-up    C/o sob with pollen, dry cough, occass. wheezing   Dyspnea:  usually fine indoors  Cough: dry p stirring / nasal congestion Sleeping: 3 pillows  SABA use: once a day / neb  02: none Covid status:   vax x 2  Rec Plan A = Automatic = Always=    Symbicort 160 Take 2 puffs first thing in am and then another 2 puffs about 12 hours later.   Plan B = Backup (to supplement plan A, not to replace it) Only use your  albuterol inhaler as a rescue medication  Plan C = Crisis (instead of Plan B but only if Plan B stops working) - only use your albuterol nebulizer if you first try Plan B  Plan D = Deltasone = Prednisone 10 mg take  4 each am x 2 days,   2 each am x 2 days,  1 each am x 2 days and stop   Make appt to see Dr Selena Batten and here prn > never returned to [facility]    08/02/2022  f/u ov/Lori Black re: asthma/allergy    maint on nothing x sev weeks   Chief Complaint  Patient presents with   Follow-up    Pt states she had some SOB and chest pain 3 days ago. Pt went to ER but was not seen because she had to get her kids  Dyspnea:  much better until ran out of symbicort due to cost  Cough: harsh, mostly dry with cp generalized with coughing fits  Sleeping: waking up needing saba now SABA use: way too much since off symbicort  02: none     No obvious day to day  or daytime variability or assoc excess/ purulent sputum or mucus plugs or hemoptysis  or chest tightness, subjective wheeze or overt sinus or hb symptoms.    Also denies any obvious fluctuation of symptoms with weather or environmental changes or other aggravating or alleviating factors except as outlined above   No unusual exposure hx or h/o childhood pna  or knowledge of premature birth.  Current Allergies, Complete Past Medical History, Past Surgical History, Family History, and Social History were reviewed in [employer] record.  ROS  The following are not active complaints unless bolded Hoarseness, sore throat, dysphagia, dental problems, itching, sneezing,  nasal congestion or discharge of excess mucus or purulent secretions, ear ache L >R ,   fever, chills, sweats, unintended wt loss or wt gain, classically pleuritic or exertional cp,  orthopnea pnd or arm/hand swelling  or leg swelling, presyncope, palpitations, abdominal pain, anorexia, nausea, vomiting, diarrhea  or change in bowel habits or change in bladder habits, change  in stools or change in urine, dysuria, hematuria,  rash, arthralgias, visual complaints, headache, numbness, weakness or ataxia or problems with walking or coordination,  change in mood or  memory.        Current Meds  Medication Sig   albuterol (PROAIR HFA) 108 (90 Base) MCG/ACT inhaler 2 puffs every 4 hours as needed only  if your can't catch your breath (Patient taking differently: Inhale 2 puffs into the lungs every 4 (four) hours as needed for shortness of breath.)   albuterol (PROVENTIL) (2.5 MG/3ML) 0.083% nebulizer solution Take 3 mLs (2.5 mg total) by nebulization every 4 (four) hours as needed for wheezing or shortness of breath.   budesonide-formoterol (SYMBICORT) 160-4.5 MCG/ACT inhaler Inhale 2 puffs into the lungs 2 (two) times daily.   Phenylephrine-APAP-guaiFENesin (TYLENOL SINUS SEVERE) 5-325-200 MG TABS Take 1 tablet by mouth daily as needed (For sinus).                  Objective:   Physical Exam   Wts  08/02/2022        144 12/22/2021           152   06/28/2021         147  07/14/2020         150  11/21/2019           153  05/07/2019         152 02/06/2019        145  11/06/2018        148  07/30/2018      143  04/17/2018          145  01/16/2018          150  04/17/2017          150    03/17/17 150 lb (68 kg)  03/07/17 150 lb 4 oz (68.2 kg)  10/09/16 162 lb 1.6 oz (73.5 kg)    Vital signs reviewed  08/02/2022  - Note at rest 02 sats  99% on RA   General appearance:    amb wf harsh dry cough    HEENT : Oropharynx  clear      Nasal turbinates nl/ ears clear/ tender over TMJs   NECK :  without  apparent JVD/ palpable Nodes/TM    LUNGS: no acc muscle use,  Nl contour chest which is clear to A and P bilaterally without cough on insp or exp maneuvers   CV:  RRR  no s3 or murmur or increase in P2, and no edema   ABD:  soft and nontender with nl inspiratory excursion in the supine position. No bruits or organomegaly appreciated   MS:  Nl gait/ ext warm without  deformities Or obvious joint restrictions  calf tenderness, cyanosis or clubbing    SKIN: warm and dry without lesions    NEURO:  alert, approp, nl sensorium with  no motor or cerebellar deficits apparent.         I personally reviewed images and agree with radiology impression as follows:  CXR:   07/31/22 pa and lateral No acute abnormality of the lungs       Assessment:

## 2022-08-02 NOTE — Assessment & Plan Note (Signed)
Onset in childhood FENO 03/17/2017  =   30 on final day of prednisone - Spirometry 03/17/2017  wnl s curvature on last day of pred p saba 5 h prior  - 03/17/2017   Start symb 160 2bid - 01/16/2018  After extensive coaching inhaler device  effectiveness =    75% (short Ti)   - FENO 01/16/2018  =   62 on symb 160 2bid  - Spirometry 01/16/2018  wnl with  Min curvature  - 01/16/2018 added singulair - Allergy profile 04/17/2018 >  Eos 0.2 /  IgE  128 RAST pos cat > dog, dust and oak trees - FENO 04/17/2018  =  28 p  symb 160/ singulair  - 06/2018 did not refill singulair as no better   - allergy eval [facility]  07/22/21 > consider allergy shots - 12/22/2021  After extensive coaching inhaler device,  effectiveness =    90% so added pred as plan D  - 08/02/2022  After extensive coaching inhaler device,  effectiveness =    90% and restarted plan D  20 until better, 10 x 5 days and stop   Flared off symbicort 160 and appealing [place] re samples so rx breztri samples for now   Also reviewed ABCD action plan above   F/u q 3 m sooner prn          Each maintenance medication was reviewed in detail including emphasizing most importantly the difference between maintenance and prns and under what circumstances the prns are to be triggered using an action plan format where appropriate.  Total time for H and P, chart review, counseling, reviewing hfa device(s) and generating customized AVS unique to this office visit / same day charting = 25 min

## 2022-08-03 ENCOUNTER — Other Ambulatory Visit: Payer: Self-pay | Admitting: Internal Medicine

## 2022-08-03 MED ORDER — BUDESONIDE-FORMOTEROL FUMARATE 160-4.5 MCG/ACT IN AERO: 2.0000 | INHALATION_SPRAY | Freq: Two times a day (BID) | RESPIRATORY_TRACT | 3 refills | Status: DC

## 2022-08-09 ENCOUNTER — Telehealth: Payer: Self-pay

## 2022-08-09 NOTE — Patient Outreach (Signed)
  Care Coordination   08/09/2022 Name: Lori Black MRN: [id number] DOB: [birth year]   Care Coordination Outreach Attempts:  A second unsuccessful outreach was attempted today to offer the patient with information about available care coordination services as a benefit of their health plan.     Follow Up Plan:  Additional outreach attempts will be made to offer the patient care coordination information and services.   Encounter Outcome:  No Answer  Care Coordination Interventions Activated:  No   Care Coordination Interventions:  No, not indicated    Lazaro Arms RN, BSN, [facility]   Phone: [phone]

## 2022-08-19 DIAGNOSIS — N9489 Other specified conditions associated with female genital organs and menstrual cycle: Secondary | ICD-10-CM | POA: Insufficient documentation

## 2022-08-19 DIAGNOSIS — M25461 Effusion, right knee: Secondary | ICD-10-CM | POA: Diagnosis not present

## 2022-08-19 DIAGNOSIS — S99921A Unspecified injury of right foot, initial encounter: Secondary | ICD-10-CM | POA: Diagnosis not present

## 2022-08-19 DIAGNOSIS — S8001XA Contusion of right knee, initial encounter: Secondary | ICD-10-CM | POA: Diagnosis not present

## 2022-08-19 DIAGNOSIS — S9031XA Contusion of right foot, initial encounter: Secondary | ICD-10-CM | POA: Diagnosis not present

## 2022-08-19 DIAGNOSIS — W208XXA Other cause of strike by thrown, projected or falling object, initial encounter: Secondary | ICD-10-CM | POA: Diagnosis not present

## 2022-08-19 DIAGNOSIS — J45909 Unspecified asthma, uncomplicated: Secondary | ICD-10-CM | POA: Insufficient documentation

## 2022-08-20 ENCOUNTER — Emergency Department (HOSPITAL_COMMUNITY): Payer: Medicare Other

## 2022-08-20 ENCOUNTER — Emergency Department (HOSPITAL_COMMUNITY)
Admission: EM | Admit: 2022-08-20 | Discharge: 2022-08-20 | Disposition: A | Discharge status: Home / Self Care | Payer: Medicare Other | Attending: Emergency Medicine | Admitting: Emergency Medicine

## 2022-08-20 ENCOUNTER — Other Ambulatory Visit: Payer: Self-pay

## 2022-08-20 ENCOUNTER — Encounter (HOSPITAL_COMMUNITY): Payer: Self-pay | Admitting: Emergency Medicine

## 2022-08-20 DIAGNOSIS — M25461 Effusion, right knee: Secondary | ICD-10-CM

## 2022-08-20 DIAGNOSIS — S99921A Unspecified injury of right foot, initial encounter: Secondary | ICD-10-CM | POA: Diagnosis not present

## 2022-08-20 DIAGNOSIS — S9031XA Contusion of right foot, initial encounter: Secondary | ICD-10-CM

## 2022-08-20 LAB — I-STAT BETA HCG BLOOD, ED (MC, WL, AP ONLY): I-stat hCG, quantitative: <5 m[IU]/mL (ref <5)

## 2022-08-20 LAB — PREGNANCY, URINE: Preg Test, Ur: NEGATIVE

## 2022-08-20 NOTE — Discharge Instructions (Signed)
Follow up with orthopedics for further evaluation of your knee and foot. Return to ER for fever, worsening or concerning symptoms.  Can apply ice for 20 minutes at a time. Motrin and Tylenol as needed as directed for pain.

## 2022-08-20 NOTE — ED Triage Notes (Signed)
Pt in with R foot/R calf/R knee pain. States she was helping move a dresser 3 days ago and it briefly swiped across the top of her R foot, limited ROM to middle 3 toes. She states pain is now radiating up R leg, swelling around R knee.

## 2022-08-20 NOTE — ED Provider Notes (Signed)
[facility]-EMERGENCY DEPT Provider Note   CSN: [id number] Arrival date & time: 08/19/22  2355     History  Chief Complaint  Patient presents with   Foot Pain   Knee Pain    Lori Black is a 36 y.o. female.  36 yo female with right foot injury, dropped a dresser on her foot 3 days ago, pain/swelling/bruising. Now with pain and swelling in the right knee. Has had fluid in the knee previously, septic arthritis bilateral 04/2021.        Home Medications Prior to Admission medications   Medication Sig Start Date End Date Taking? Authorizing Provider  albuterol (PROAIR HFA) 108 (90 Base) MCG/ACT inhaler 2 puffs every 4 hours as needed only  if your can't catch your breath Patient taking differently: Inhale 2 puffs into the lungs every 4 (four) hours as needed for shortness of breath. 06/28/21   Nyoka Cowden, MD  albuterol (PROVENTIL) (2.5 MG/3ML) 0.083% nebulizer solution Take 3 mLs (2.5 mg total) by nebulization every 4 (four) hours as needed for wheezing or shortness of breath. 02/24/22   Nyoka Cowden, MD  Budeson-Glycopyrrol-Formoterol (BREZTRI AEROSPHERE) 160-9-4.8 MCG/ACT AERO Inhale 2 puffs into the lungs in the morning and at bedtime. 08/02/22   Nyoka Cowden, MD  budesonide-formoterol (SYMBICORT) 160-4.5 MCG/ACT inhaler Inhale 2 puffs into the lungs 2 (two) times daily. 08/03/22   Nyoka Cowden, MD  Phenylephrine-APAP-guaiFENesin (TYLENOL SINUS SEVERE) 5-325-200 MG TABS Take 1 tablet by mouth daily as needed (For sinus).    [provider]  predniSONE (DELTASONE) 10 MG tablet 2 daily until better then 1 daily x 5 days and stop 08/02/22   Nyoka Cowden, MD      Allergies    Patient has no known allergies.    Review of Systems   Review of Systems Negative except as per HPI Physical Exam Updated Vital Signs BP 122/75   Pulse 87   Temp 99 F (37.2 C) (Oral)   Resp 19   Wt 65.5 kg   LMP 07/28/2022 (Approximate)   SpO2 99%   BMI  23.31 kg/m  Physical Exam Vitals and nursing note reviewed.  Constitutional:      General: She is not in acute distress.    Appearance: She is well-developed. She is not diaphoretic.  HENT:     Head: Normocephalic and atraumatic.  Cardiovascular:     Pulses: Normal pulses.  Pulmonary:     Effort: Pulmonary effort is normal.  Musculoskeletal:        General: Swelling and tenderness present.     Right knee: Effusion present. No deformity, erythema, ecchymosis, lacerations, bony tenderness or crepitus. Tenderness present over the medial joint line and lateral joint line. Normal pulse.     Right lower leg: No edema.     Left lower leg: No edema.       Legs:  Skin:    General: Skin is warm and dry.     Findings: No erythema or rash.  Neurological:     Mental Status: She is alert and oriented to person, place, and time.     Sensory: No sensory deficit.     Motor: No weakness.  Psychiatric:        Behavior: Behavior normal.     ED Results / Procedures / Treatments   Labs (all labs ordered are listed, but only abnormal results are displayed) Labs Reviewed  PREGNANCY, URINE  I-STAT BETA HCG BLOOD, ED ([facility], [facility],  [facility] ONLY)    EKG None  Radiology DG Foot Complete Right  Result Date: 08/20/2022 CLINICAL DATA:  Status post trauma. EXAM: RIGHT FOOT COMPLETE - 3+ VIEW COMPARISON:  None Available. FINDINGS: There is no evidence of fracture or dislocation. There is no evidence of arthropathy or other focal bone abnormality. Soft tissues are unremarkable. IMPRESSION: Negative. Electronically Signed   By: Virgina Norfolk M.D.   On: 08/20/2022 01:06   DG Knee Complete 4 Views Right  Result Date: 08/20/2022 CLINICAL DATA:  Status post trauma. EXAM: RIGHT KNEE - COMPLETE 4+ VIEW COMPARISON:  None Available. FINDINGS: No evidence of an acute fracture or dislocation. No evidence of arthropathy or other focal bone abnormality. A moderate sized joint effusion is noted. IMPRESSION: Moderate  sized joint effusion without evidence of an acute osseous abnormality. Electronically Signed   By: Virgina Norfolk M.D.   On: 08/20/2022 01:00    Procedures Procedures    Medications Ordered in ED Medications - No data to display  ED Course/ Medical Decision Making/ A&P                           Medical Decision Making Amount and/or Complexity of Data Reviewed Labs: ordered. Radiology: ordered.   This patient presents to the ED for concern of right foot pain, right knee pain, this involves an extensive number of treatment options, and is a complaint that carries with it a high risk of complications and morbidity.  The differential diagnosis includes but not limited to contusion, fracture, sprain, dislocation, effusion   Co morbidities that complicate the patient evaluation  Asthma, prior admission for septic arthritis dated 05/07/2022   Additional history obtained:  External records from outside source obtained and reviewed including prior cultures from 01/22/22 and 05/12/22 right knee joint aspiration negative for growth   Lab Tests:  I Ordered, and personally interpreted labs.  The pertinent results include: hCG negative   Imaging Studies ordered:  I ordered imaging studies including x-ray right knee, x-ray right foot I independently visualized and interpreted imaging which showed right knee effusion, no acute bony abnormality of foot or knee I agree with the radiologist interpretation  Consultations Obtained:  I requested consultation with the Er attending, Dr. Randal Buba,  and discussed lab and imaging findings as well as pertinent plan - they recommend: agree with plan for knee immobilizer, crutches, refer to ortho for follow up. Does not knee joint aspiration today.   Problem List / ED Course / Critical interventions / Medication management  37 year old female with right foot pain bruising/swelling after dropping a dresser on her foot a few days ago. Also now with  right knee effusion. XR right foot negative for bony injury. Right knee effusion clinically and on XR. History of prior septic knee 04/2021, since that time has had 2 prior taps in the ER that did not have any growth. Suspect her effusion tonight is related to her foot injury and does not require tap tonight. Discussed with attending who agrees with plan of care. Plan is for referral to ortho for follow up, placed in knee immobilizer, post op shoe and crutches. Return to ER for worsening knee pain, redness, fever or other concerns.  I have reviewed the patients home medicines and have made adjustments as needed   Social Determinants of Health:  Has PCP, referred to Ortho for follow-up   Test / Admission - Considered:  Consider joint aspiration based on  history however, is afebrile, no erythema, is able to minimally range knee. Prior two joint aspirations on file negative for growth. Suspect her effusion tonight is trauma related from her foot injury.          Final Clinical Impression(s) / ED Diagnoses Final diagnoses:  Contusion of right foot, initial encounter  Effusion of right knee    Rx / DC Orders ED Discharge Orders     None         Alden Hipp 08/20/22 Lavell Anchors, April, MD 08/20/22 2542

## 2022-09-08 IMAGING — CT CT ANGIO CHEST
2 of 6 series · 18 of 36 positions shown · IV contrast (omnipaque)
Comparison: None.

CLINICAL DATA: Chest pain, dyspnea.  PE suspected, high prob

EXAM:
CT ANGIOGRAPHY CHEST WITH CONTRAST
TECHNIQUE: Multidetector CT imaging of the chest was performed using the
standard protocol during bolus administration of intravenous
contrast. Multiplanar CT image reconstructions and MIPs were
obtained to evaluate the vascular anatomy.
CONTRAST:  80mL OMNIPAQUE IOHEXOL 350 MG/ML SOLN

[Series 5: thins · axial · 0.74mm/px · z∈[+1497,+1759]mm · 17 of 296 slices shown]
[im 17/296  lung]
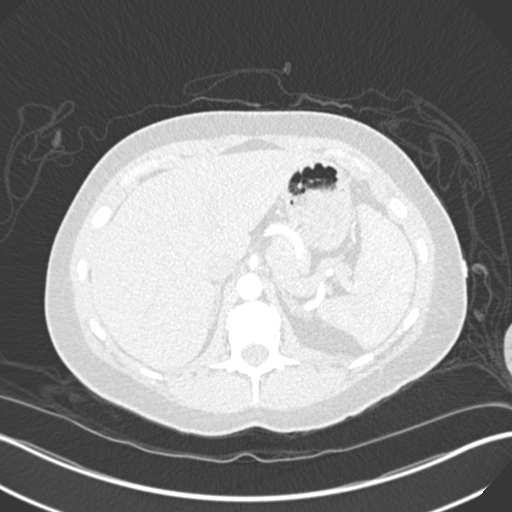
[im 33/296  mediastinal]
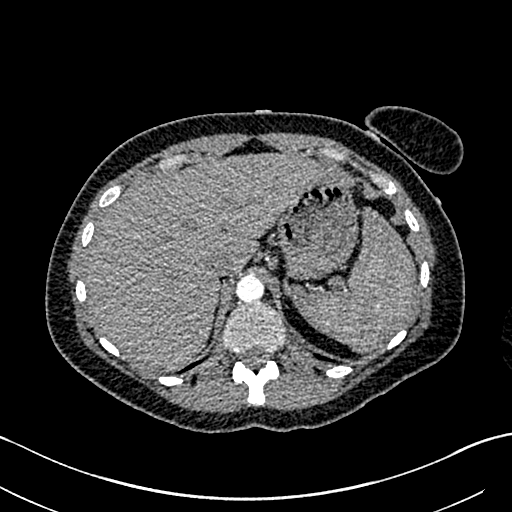
[im 50/296  lung]
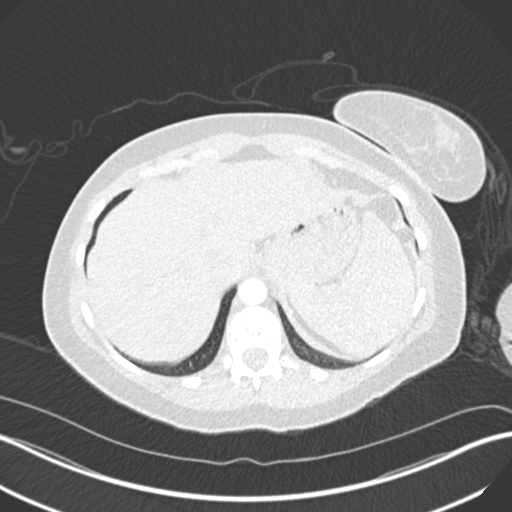
[im 66/296  mediastinal]
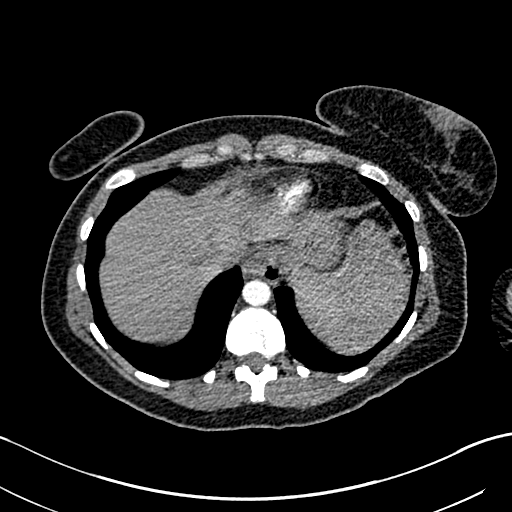
[im 82/296  lung]
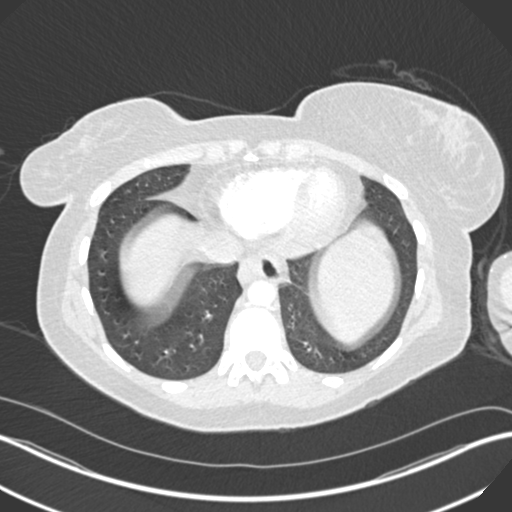
[im 99/296  mediastinal]
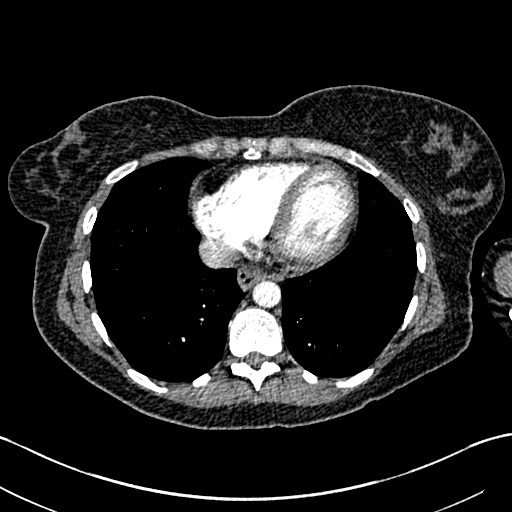
[im 115/296  lung]
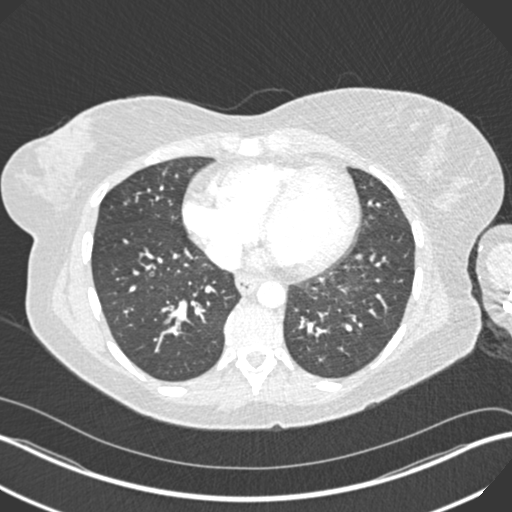
[im 132/296  mediastinal]
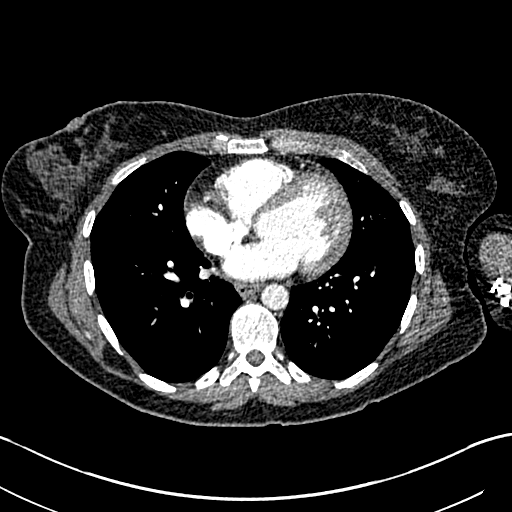
[im 148/296  lung]
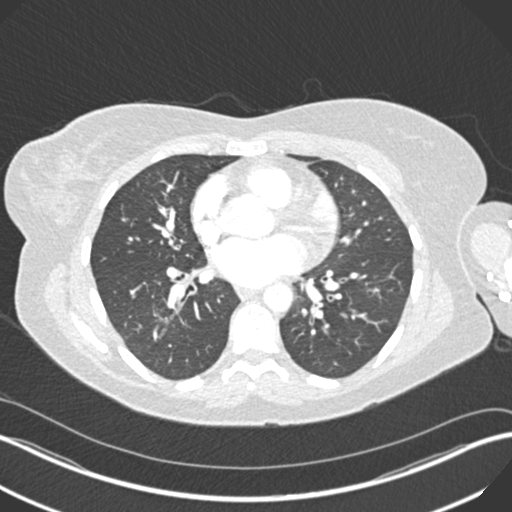
[im 164/296  mediastinal]
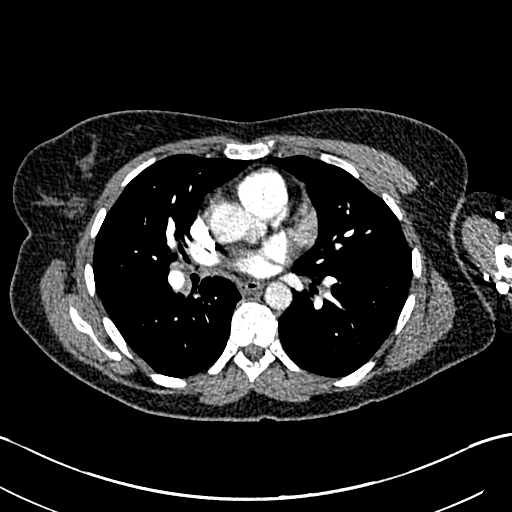
[im 181/296  lung]
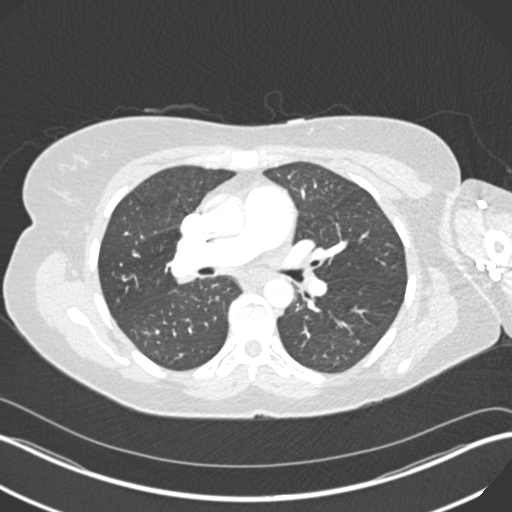
[im 197/296  mediastinal]
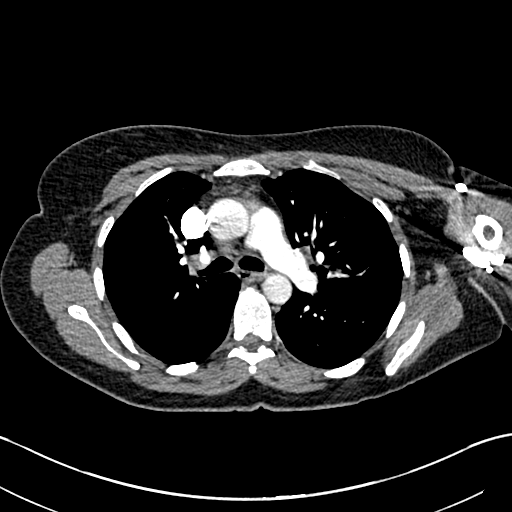
[im 214/296  lung]
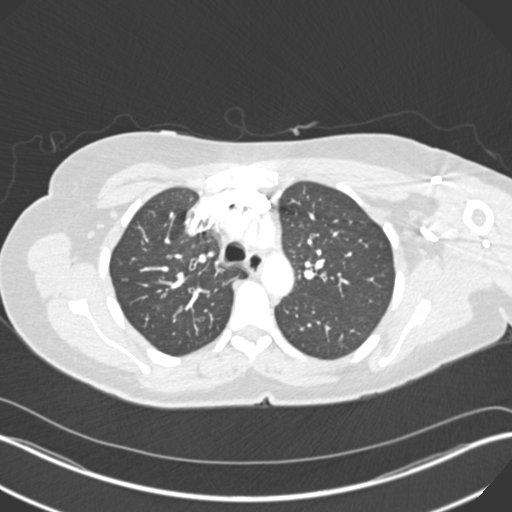
[im 230/296  mediastinal]
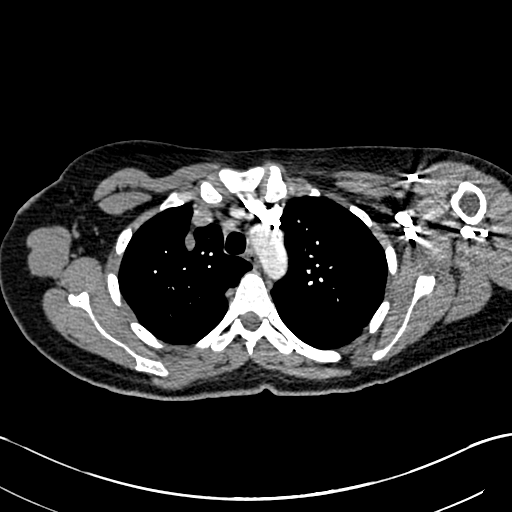
[im 246/296  lung]
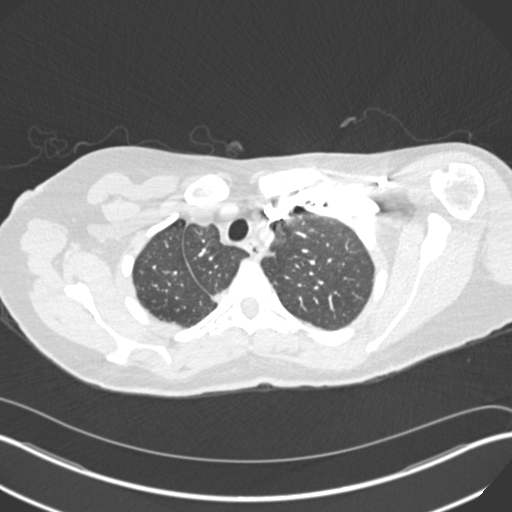
[im 263/296  mediastinal]
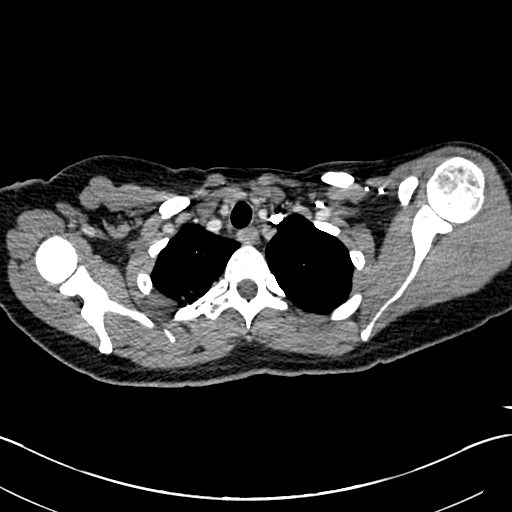
[im 279/296  lung]
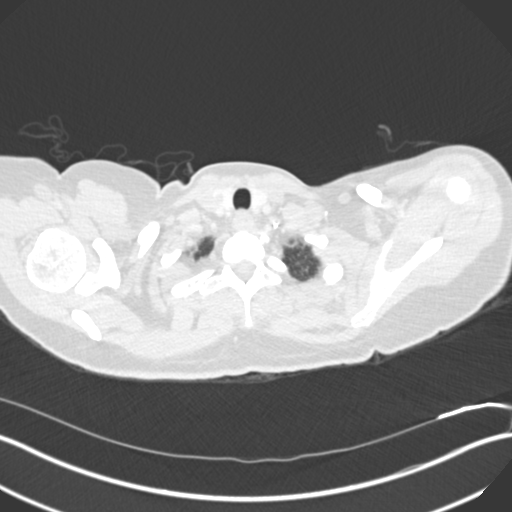

[Series 7: coronal mpr · coronal · 0.62mm/px · 1 of 130 slices shown]
[im 65/130  mediastinal]
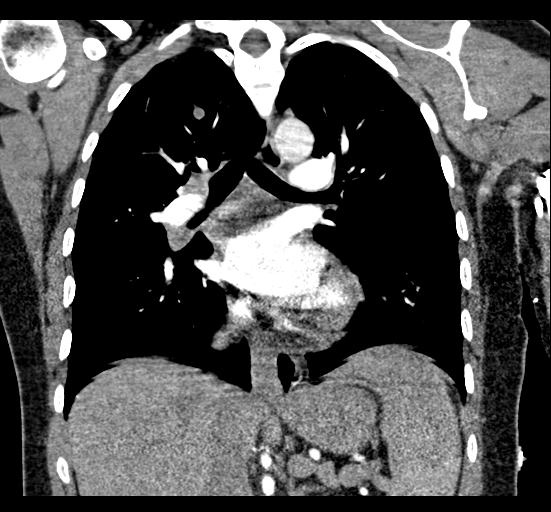

[18 of 36 positions shown; findings below may reference images not displayed]

FINDINGS: Cardiovascular: Adequate opacification of the pulmonary arterial
tree. No intraluminal filling defect identified to suggest acute
pulmonary embolism. Central pulmonary arteries are of normal
caliber. No significant coronary artery calcification. Cardiac size
within normal limits. No pericardial effusion. No significant
atherosclerotic calcification within the thoracic aorta. No aortic
aneurysm.

Mediastinum/Nodes: 9 mm right thyroid nodule. The visualized thyroid
gland is otherwise unremarkable. Shotty bilateral hilar adenopathy
may be reactive in nature. No frankly pathologic thoracic adenopathy
identified. Small epiphrenic esophageal diverticulum noted. The
esophagus is otherwise unremarkable.

Lungs/Pleura: There is moderate to severe bronchial wall thickening
noted centrally, with scattered areas of mild airway impaction
within the peripheral lung bases bilaterally in keeping with airway
inflammation. No superimposed focal pulmonary infiltrates. 4 mm
noncalcified pulmonary nodule noted within the right lower lobe,
axial image # 84/6. No pneumothorax or pleural effusion. No central
obstructing mass. Note made of an azygous fissure, a normal anatomic
variant.

Upper Abdomen: No acute abnormality.

Musculoskeletal: No chest wall abnormality. No acute or significant
osseous findings.

Review of the MIP images confirms the above findings.
IMPRESSION: No pulmonary embolism.

Moderate to severe bronchial wall thickening diffusely and scattered
areas of peripheral airway impaction within the lung bases in
keeping with airway inflammation. Shotty bilateral hilar adenopathy
may be reactive in nature.

4 mm right solid pulmonary nodule. No routine follow-up imaging is
recommended per [HOSPITAL] Guidelines.

These guidelines do not apply to immunocompromised patients and
patients with cancer. Follow up in patients with significant
comorbidities as clinically warranted. For lung cancer screening,
adhere to Lung-RADS guidelines. Reference: Radiology. 1234;
284(1):228-43.

4 mm right solid pulmonary nodule. No routine follow-up imaging is
recommended per [HOSPITAL] Guidelines.

These guidelines do not apply to immunocompromised patients and
patients with cancer. Follow up in patients with significant
comorbidities as clinically warranted. For lung cancer screening,
adhere to Lung-RADS guidelines. Reference: Radiology. 1234;
284(1):228-43.

Subcentimeter incidental right thyroid nodule. No follow-up imaging
is recommended.

Reference: [HOSPITAL]. [DATE]): 143-50

Small epiphrenic esophageal diverticulum.

## 2022-09-14 NOTE — Telephone Encounter (Signed)
Rescheduled 09/20/22  [facility]  Care Coordination Care Guide  Direct Dial: [phone]

## 2022-09-20 ENCOUNTER — Telehealth: Payer: Self-pay

## 2022-09-20 NOTE — Patient Outreach (Signed)
  Care Coordination   09/20/2022 Name: Lori Black MRN: [id number] DOB: 12-16-1985   Care Coordination Outreach Attempts:  A third unsuccessful outreach was attempted today to offer the patient with information about available care coordination services as a benefit of their health plan.   Follow Up Plan:  No further outreach attempts will be made at this time. We have been unable to contact the patient to offer or enroll patient in care coordination services  Encounter Outcome:  No Answer   Care Coordination Interventions:  No, not indicated    Juanell Fairly RN, BSN, [facility] Care Coordinator [facility]   Phone: [phone]

## 2022-09-23 ENCOUNTER — Telehealth: Payer: Self-pay | Admitting: *Deleted

## 2022-09-23 NOTE — Progress Notes (Signed)
  Care Coordination Note  09/23/2022 Name: Lori Black MRN: [id number] DOB: Apr 21, 1986  Lori Black is a 36 y.o. year old female who is a primary care patient of Nyoka Cowden, MD and is actively engaged with the care management team. I reached out to [employer] by phone today to assist with re-scheduling an initial visit with the RN Case Manager  Follow up plan: Unsuccessful telephone outreach attempt made. A HIPAA compliant phone message was left for the patient providing contact information and requesting a return call.   [facility]  Care Coordination Care Guide  Direct Dial: [phone]

## 2022-09-26 DIAGNOSIS — Z20828 Contact with and (suspected) exposure to other viral communicable diseases: Secondary | ICD-10-CM | POA: Diagnosis not present

## 2022-09-26 DIAGNOSIS — R509 Fever, unspecified: Secondary | ICD-10-CM | POA: Diagnosis not present

## 2022-09-26 DIAGNOSIS — R059 Cough, unspecified: Secondary | ICD-10-CM | POA: Diagnosis not present

## 2022-09-30 NOTE — Progress Notes (Signed)
  Care Coordination Note  09/30/2022 Name: Lori Black MRN: [id number] DOB: [birth year]  Lori Black is a 36 y.o. year old female who is a primary care patient of Nyoka Cowden, MD and is actively engaged with the care management team. I reached out to [employer] by phone today to assist with re-scheduling an initial visit with the RN Case Manager  Follow up plan: Unsuccessful telephone outreach attempt made. A HIPAA compliant phone message was left for the patient providing contact information and requesting a return call.  We have been unable to make contact with the patient for follow up. The care management team is available to follow up with the patient after provider conversation with the patient regarding recommendation for care management engagement and subsequent re-referral to the care management team.   [facility] Coordination Care Guide  Direct Dial: [phone]

## 2022-11-06 NOTE — Progress Notes (Deleted)
Subjective:     Patient ID: [name], female   DOB: 01-05-1986    MRN: [id number]  Brief patient profile:  32  yowf never smoker asthma since childhood never free of symptoms or need for inhaler and frequent prednisone rx x every few months maint by [facility] doctor on symbicort fall 2017 with no change pattern > referred to pulmonary clinic 03/17/2017 by EDP p 3rd ER trip in 6 months with one admit for asthma        History of Present Illness  03/17/2017 1st [facility] office visit/ Rakisha Pincock   Chief Complaint  Patient presents with   Pulmonary Consult    Self referral. Pt states that she was dxed with Asthma at birth. She recently moved here from [facility]. She has been out of her Symbicort for the past month. She has been having increased SOB and cough with thick, yellow sputum recently.  She has been using albuterol neb and albuterol inhaler at least once per day.   just took last prednisone on day of ov Out of symb x sev weeks   But when on symb and pred only needs neb once a day  Has assoc nasal congestions and rattling cough worse at hs and in AM Baseline hfa very poor - see hfa  rec  Plan A = Automatic = symbicort 160 Take 2 puffs first thing in am and then another 2 puffs about 12 hours later.  Work on inhaler technique:   Plan B = Backup Only use your albuterol as a rescue medication     12/22/2021  f/u ov/Efren Kross re: asthma   maint on symbicort 160   / last prednisone was one month prior to OV , has not been back to allergy yet  Chief Complaint  Patient presents with   Follow-up    C/o sob with pollen, dry cough, occass. wheezing   Dyspnea:  usually fine indoors  Cough: dry p stirring / nasal congestion Sleeping: 3 pillows  SABA use: once a day / neb  02: none Covid status:   vax x 2  Rec Plan A = Automatic = Always=    Symbicort 160 Take 2 puffs first thing in am and then another 2 puffs about 12 hours later.   Plan B = Backup (to supplement plan A, not to replace it) Only use your  albuterol inhaler as a rescue medication  Plan C = Crisis (instead of Plan B but only if Plan B stops working) - only use your albuterol nebulizer if you first try Plan B  Plan D = Deltasone = Prednisone 10 mg take  4 each am x 2 days,   2 each am x 2 days,  1 each am x 2 days and stop   Make appt to see Dr Maudie Mercury and here prn > never returned to [facility]    08/02/2022  f/u ov/Ozzie Remmers re: asthma/allergy    maint on nothing x sev weeks   Chief Complaint  Patient presents with   Follow-up    Pt states she had some SOB and chest pain 3 days ago. Pt went to ER but was not seen because she had to get her kids  Dyspnea:  much better until ran out of symbicort due to cost  Cough: harsh, mostly dry with cp generalized with coughing fits  Sleeping: waking up needing saba now SABA use: way too much since off symbicort  02: none Rec Plan A = Automatic = Always=  Symbicort 160 (Breztri) Take 2 puffs first thing in am and then another 2 puffs about 12 hours later.   Plan B = Backup (to supplement plan A, not to replace it) Only use your albuterol inhaler as a rescue medication Plan C = Crisis (instead of Plan B but only if Plan B stops working) - only use your albuterol nebulizer if you first try Plan B  Plan D = Deltasone = Prednisone 10 mg 2 daily until better then 1 daily x 5 days and stop Plan E  = ER  go there if all else fails       11/07/2022  f/u ov/Isahi Godwin re: ***   maint on ***  No chief complaint on file.   Dyspnea:  *** Cough: *** Sleeping: *** SABA use: *** 02: *** Covid status:   *** Lung cancer screening :  ***    No obvious day to day or daytime variability or assoc excess/ purulent sputum or mucus plugs or hemoptysis or cp or chest tightness, subjective wheeze or overt sinus or hb symptoms.   *** without nocturnal  or early am exacerbation  of respiratory  c/o's or need for noct saba. Also denies any obvious fluctuation of symptoms with weather or environmental changes or other  aggravating or alleviating factors except as outlined above   No unusual exposure hx or h/o childhood pna/ asthma or knowledge of premature birth.  Current Allergies, Complete Past Medical History, Past Surgical History, Family History, and Social History were reviewed in [employer] record.  ROS  The following are not active complaints unless bolded Hoarseness, sore throat, dysphagia, dental problems, itching, sneezing,  nasal congestion or discharge of excess mucus or purulent secretions, ear ache,   fever, chills, sweats, unintended wt loss or wt gain, classically pleuritic or exertional cp,  orthopnea pnd or arm/hand swelling  or leg swelling, presyncope, palpitations, abdominal pain, anorexia, nausea, vomiting, diarrhea  or change in bowel habits or change in bladder habits, change in stools or change in urine, dysuria, hematuria,  rash, arthralgias, visual complaints, headache, numbness, weakness or ataxia or problems with walking or coordination,  change in mood or  memory.        No outpatient medications have been marked as taking for the 11/07/22 encounter (Appointment) with Tanda Rockers, MD.                        Objective:   Physical Exam   Wts  11/07/2022           ***  08/02/2022        144 12/22/2021           152   06/28/2021         147  07/14/2020         150  11/21/2019           153  05/07/2019         152 02/06/2019        145  11/06/2018        148  07/30/2018      143  04/17/2018          145  01/16/2018          150  04/17/2017          150    03/17/17 150 lb (68 kg)  03/07/17 150 lb 4 oz (68.2 kg)  10/09/16 162 lb 1.6 oz (73.5 kg)  Vital signs reviewed  11/07/2022  - Note at rest 02 sats  ***% on ***   General appearance:    ***                Assessment:

## 2022-11-07 ENCOUNTER — Ambulatory Visit: Payer: Medicare Other | Admitting: Internal Medicine

## 2023-02-10 ENCOUNTER — Other Ambulatory Visit: Payer: Self-pay

## 2023-02-10 ENCOUNTER — Telehealth: Payer: Self-pay | Admitting: Internal Medicine

## 2023-02-10 MED ORDER — ALBUTEROL SULFATE (2.5 MG/3ML) 0.083% IN NEBU: 2.5000 mg | INHALATION_SOLUTION | RESPIRATORY_TRACT | 3 refills | Status: AC | PRN

## 2023-02-10 NOTE — Telephone Encounter (Signed)
Spoke with patient. Advised neb has been sent to pharmacy. NFN

## 2023-02-25 NOTE — Progress Notes (Deleted)
 Subjective:   Patient ID: Lori Black, female   DOB: 10/16/1986    MRN: [id number]  Brief patient profile:  36  yowf never smoker asthma since childhood never free of symptoms or need for inhaler and frequent prednisone rx x every few months maint by [facility] doctor on symbicort fall 2017 with no change pattern > referred to pulmonary clinic 03/17/2017 by EDP p 3rd ER trip in 6 months with one admit for asthma        History of Present Illness  03/17/2017 1st [facility] office visit/ Farryn Linares   Chief Complaint  Patient presents with   Pulmonary Consult    Self referral. Pt states that she was dxed with Asthma at birth. She recently moved here from [place]. She has been out of her Symbicort for the past month. She has been having increased SOB and cough with thick, yellow sputum recently.  She has been using albuterol neb and albuterol inhaler at least once per day.   just took last prednisone on day of ov Out of symb x sev weeks   But when on symb and pred only needs neb once a day  Has assoc nasal congestions and rattling cough worse at hs and in AM Baseline hfa very poor - see hfa  rec  Plan A = Automatic = symbicort 160 Take 2 puffs first thing in am and then another 2 puffs about 12 hours later.  Work on inhaler technique:   Plan B = Backup Only use your albuterol as a rescue medication     12/22/2021  f/u ov/Natallie Ravenscroft re: asthma   maint on symbicort 160   / last prednisone was one month prior to OV , has not been back to allergy yet  Chief Complaint  Patient presents with   Follow-up    C/o sob with pollen, dry cough, occass. wheezing  Rec Plan A = Automatic = Always=    Symbicort 160 Take 2 puffs first thing in am and then another 2 puffs about 12 hours later.   Plan B = Backup (to supplement plan A, not to replace it) Only use your albuterol inhaler as a rescue medication  Plan C = Crisis (instead of Plan B but only if Plan B stops working) - only use your albuterol nebulizer if you first  try Plan B  Plan D = Deltasone = Prednisone 10 mg take  4 each am x 2 days,   2 each am x 2 days,  1 each am x 2 days and stop   Make appt to see Dr Kim and here prn >> did not returned to [facility]    08/02/2022  f/u ov/Chamya Hunton re: asthma/allergy    maint on nothing x sev weeks   Chief Complaint  Patient presents with   Follow-up    Pt states she had some SOB and chest pain 3 days ago. Pt went to ER but was not seen because she had to get her kids  Dyspnea:  much better until ran out of symbicort due to cost  Cough: harsh, mostly dry with cp generalized with coughing fits  Sleeping: waking up needing saba now SABA use: way too much since off symbicort  02: none Rec Plan A = Automatic = Always=    Symbicort 160 (Breztri) Take 2 puffs first thing in am and then another 2 puffs about 12 hours later.   Plan B = Backup (to supplement plan A, not to replace it) Only use   your albuterol inhaler as a rescue medication Plan C = Crisis (instead of Plan B but only if Plan B stops working) - only use your albuterol nebulizer if you first try Plan B and it fails to help  Plan D = Deltasone = Prednisone 10 mg 2 daily until better then 1 daily x 5 days and stop  Please schedule a follow up visit in 3 months but call sooner if needed    03/15/2023  ACUTE  ov/Lesslie Mckeehan re: allergy/asthma maint on breztri x 2 weeks   Dyspnea:  can't vacuum for more than a few min  Cough: harsh 24/7  Sleeping: sitting upright   SABA use: every few days        Objective:   Physical Exam   Wts  02/27/2023          144  08/02/2022       144 12/22/2021           152   06/28/2021         147  07/14/2020         150  11/21/2019           153  05/07/2019         152 02/06/2019        145  11/06/2018        148  07/30/2018      143  04/17/2018          145  01/16/2018          150  04/17/2017          150    03/17/17 150 lb (68 kg)  03/07/17 150 lb 4 oz (68.2 kg)  10/09/16 162 lb 1.6 oz (73.5 kg)    Vital signs reviewed  02/27/2023   - Note at rest 02 sats  99% on RA   General appearance:    amb wf nad  HEENT : Oropharynx  clear      NECK :  without  apparent JVD/ palpable Nodes/TM    LUNGS: no acc muscle use,  Mild barrel  contour chest wall with bilateral  Distant exp  rhonchi and  without cough on insp or exp maneuvers  and mild  Hyperresonant  to  percussion bilaterally     CV:  RRR  no s3 or murmur or increase in P2, and no edema   ABD:  soft and nontender    MS:  Nl gait/ ext warm without deformities Or obvious joint restrictions  calf tenderness, cyanosis or clubbing     SKIN: warm and dry without lesions    NEURO:  alert, approp, nl sensorium with  no motor or cerebellar deficits apparent.           Assessment:          

## 2023-02-27 ENCOUNTER — Ambulatory Visit: Payer: Medicare Other | Admitting: Internal Medicine

## 2023-03-10 ENCOUNTER — Telehealth: Payer: Self-pay | Admitting: Internal Medicine

## 2023-03-10 ENCOUNTER — Other Ambulatory Visit: Payer: Self-pay

## 2023-03-10 MED ORDER — ALBUTEROL SULFATE HFA 108 (90 BASE) MCG/ACT IN AERS: INHALATION_SPRAY | RESPIRATORY_TRACT | 6 refills | Status: AC

## 2023-03-10 NOTE — Telephone Encounter (Signed)
Albuterol has been sent to pharmacy. Pt is aware. NFN

## 2023-03-10 NOTE — Telephone Encounter (Signed)
[facility] on [address]   Needs Albuterol inhaler refill called in.   [phone] is her number.

## 2023-03-10 NOTE — Telephone Encounter (Signed)
Spoke with patient. Advised albuterol inhaler has been sent to pharmacy. NFN 

## 2023-03-15 NOTE — Progress Notes (Unsigned)
Subjective:   Patient ID: Lori Black, female   DOB: 1986-02-22    MRN: [id number]  Brief patient profile:  68  yowf never smoker asthma since childhood never free of symptoms or need for inhaler and frequent prednisone rx x every few months maint by [facility] doctor on symbicort fall 2017 with no change pattern > referred to pulmonary clinic 03/17/2017 by EDP p 3rd ER trip in 6 months with one admit for asthma        History of Present Illness  03/17/2017 1st [facility] office visit/ Lori Black   Chief Complaint  Patient presents with   Pulmonary Consult    Self referral. Pt states that she was dxed with Asthma at birth. She recently moved here from [facility]. She has been out of her Symbicort for the past month. She has been having increased SOB and cough with thick, yellow sputum recently.  She has been using albuterol neb and albuterol inhaler at least once per day.   just took last prednisone on day of ov Out of symb x sev weeks   But when on symb and pred only needs neb once a day  Has assoc nasal congestions and rattling cough worse at hs and in AM Baseline hfa very poor - see hfa  rec  Plan A = Automatic = symbicort 160 Take 2 puffs first thing in am and then another 2 puffs about 12 hours later.  Work on inhaler technique:   Plan B = Backup Only use your albuterol as a rescue medication     12/22/2021  f/u ov/Lori Black re: asthma   maint on symbicort 160   / last prednisone was one month prior to OV , has not been back to allergy yet  Chief Complaint  Patient presents with   Follow-up    C/o sob with pollen, dry cough, occass. wheezing  Rec Plan A = Automatic = Always=    Symbicort 160 Take 2 puffs first thing in am and then another 2 puffs about 12 hours later.   Plan B = Backup (to supplement plan A, not to replace it) Only use your albuterol inhaler as a rescue medication  Plan C = Crisis (instead of Plan B but only if Plan B stops working) - only use your albuterol nebulizer if you first  try Plan B  Plan D = Deltasone = Prednisone 10 mg take  4 each am x 2 days,   2 each am x 2 days,  1 each am x 2 days and stop   Make appt to see Dr Selena Batten and here prn >> did not returned to [facility]    08/02/2022  f/u ov/Lori Black re: asthma/allergy    maint on nothing x sev weeks   Chief Complaint  Patient presents with   Follow-up    Pt states she had some SOB and chest pain 3 days ago. Pt went to ER but was not seen because she had to get her kids  Dyspnea:  much better until ran out of symbicort due to cost  Cough: harsh, mostly dry with cp generalized with coughing fits  Sleeping: waking up needing saba now SABA use: way too much since off symbicort  02: none Rec Plan A = Automatic = Always=    Symbicort 160 (Breztri) Take 2 puffs first thing in am and then another 2 puffs about 12 hours later.   Plan B = Backup (to supplement plan A, not to replace it) Only use  your albuterol inhaler as a rescue medication Plan C = Crisis (instead of Plan B but only if Plan B stops working) - only use your albuterol nebulizer if you first try Plan B and it fails to help  Plan D = Deltasone = Prednisone 10 mg 2 daily until better then 1 daily x 5 days and stop  Please schedule a follow up visit in 3 months but call sooner if needed    03/15/2023  ACUTE  ov/Lori Black re: allergy/asthma maint on breztri x 2 weeks   Dyspnea:  can't vacuum for more than a few min  Cough: harsh 24/7  Sleeping: sitting upright   SABA use: every few days        Objective:   Physical Exam   Wts  02/27/2023          144  08/02/2022       144 12/22/2021           152   06/28/2021         147  07/14/2020         150  11/21/2019           153  05/07/2019         152 02/06/2019        145  11/06/2018        148  07/30/2018      143  04/17/2018          145  01/16/2018          150  04/17/2017          150    03/17/17 150 lb (68 kg)  03/07/17 150 lb 4 oz (68.2 kg)  10/09/16 162 lb 1.6 oz (73.5 kg)    Vital signs reviewed  02/27/2023   - Note at rest 02 sats  99% on RA   General appearance:    amb wf nad  HEENT : Oropharynx  clear      NECK :  without  apparent JVD/ palpable Nodes/TM    LUNGS: no acc muscle use,  Mild barrel  contour chest wall with bilateral  Distant exp  rhonchi and  without cough on insp or exp maneuvers  and mild  Hyperresonant  to  percussion bilaterally     CV:  RRR  no s3 or murmur or increase in P2, and no edema   ABD:  soft and nontender    MS:  Nl gait/ ext warm without deformities Or obvious joint restrictions  calf tenderness, cyanosis or clubbing     SKIN: warm and dry without lesions    NEURO:  alert, approp, nl sensorium with  no motor or cerebellar deficits apparent.           Assessment:

## 2023-03-16 ENCOUNTER — Ambulatory Visit (INDEPENDENT_AMBULATORY_CARE_PROVIDER_SITE_OTHER): Payer: Self-pay | Admitting: Internal Medicine

## 2023-03-16 ENCOUNTER — Encounter: Payer: Self-pay | Admitting: Internal Medicine

## 2023-03-16 VITALS — BP 104/64 | HR 77 | Temp 98.9°F | Ht 66.0 in | Wt 144.8 lb

## 2023-03-16 DIAGNOSIS — J4530 Mild persistent asthma, uncomplicated: Secondary | ICD-10-CM

## 2023-03-16 MED ORDER — METHYLPREDNISOLONE ACETATE 80 MG/ML IJ SUSP
120.0000 mg | Freq: Once | INTRAMUSCULAR | Status: AC
  Administered 2023-03-16: 120 mg via INTRAMUSCULAR

## 2023-03-16 MED ORDER — BREZTRI AEROSPHERE 160-9-4.8 MCG/ACT IN AERO: 2.0000 | INHALATION_SPRAY | Freq: Two times a day (BID) | RESPIRATORY_TRACT | 0 refills | Status: DC

## 2023-03-16 NOTE — Addendum Note (Signed)
Addended byClyda Greener M on: 03/16/2023 02:11 PM   Modules accepted: Orders

## 2023-03-16 NOTE — Patient Instructions (Addendum)
[employer] [phone] they are the disability experts   Ok to use nebulizer up to 3 every 4 hours as needed   Ok to use Breztri up to 1- 2puffs every 12 hours  Work on inhaler technique:  relax and gently blow all the way out then take a nice smooth full deep breath back in, triggering the inhaler at same time you start breathing in.  Hold breath in for at least  5 seconds if you can. Blow out breztri  thru nose. Rinse and gargle with water when done.  If mouth or throat bother you at all,  try brushing teeth/gums/tongue with arm and hammer toothpaste/ make a slurry and gargle and spit out.   >>>  Remember how golfers warm up by taking practice swings - do this with an empty inhaler    We will refer you to the [facility] and see you here as needed and you see the social worker there for advice [phone]  Follow up here is as needed

## 2023-03-16 NOTE — Assessment & Plan Note (Signed)
Onset in childhood FENO 03/17/2017  =   28 on final day of prednisone - Spirometry 03/17/2017  wnl s curvature on last day of pred p saba 5 h prior  - 03/17/2017   Start symb 160 2bid - 01/16/2018  After extensive coaching inhaler device  effectiveness =    75% (short Ti)   - FENO 01/16/2018  =   62 on symb 160 2bid  - Spirometry 01/16/2018  wnl with  Min curvature  - 01/16/2018 added singulair - Allergy profile 04/17/2018 >  Eos 0.2 /  IgE  128 RAST pos cat > dog, dust and oak trees - FENO 04/17/2018  =  28 p  symb 160/ singulair  - 06/2018 did not refill singulair as no better   - allergy eval [facility]  07/22/21 > consider allergy shots - 12/22/2021  After extensive coaching inhaler device,  effectiveness =    90% so added pred as plan D  - 08/02/2022  After extensive coaching inhaler device,  effectiveness =    90% and restarted plan D  20 until better, 10 x 5 days and stop  - 03/16/2023  After extensive coaching inhaler device,  effectiveness =    75% (short Ti/ short breath hold) rec breztri 1-2 bid and referred to [facility] as no longer has any funds at all for meds/ visits   Advised seek disability advice per [employer] also.         Each maintenance medication was reviewed in detail including emphasizing most importantly the difference between maintenance and prns and under what circumstances the prns are to be triggered using an action plan format where appropriate.  Total time for H and P, chart review, counseling, reviewing hfda device(s) and generating customized AVS unique to this office visit / same day charting = 15 min

## 2023-03-16 NOTE — Addendum Note (Signed)
Addended byClyda Greener M on: 03/16/2023 05:18 PM   Modules accepted: Orders

## 2023-05-16 ENCOUNTER — Emergency Department (HOSPITAL_COMMUNITY)
Admission: EM | Admit: 2023-05-16 | Discharge: 2023-05-16 | Disposition: A | Discharge status: Home / Self Care | Payer: Medicaid Other | Attending: Emergency Medicine | Admitting: Emergency Medicine

## 2023-05-16 ENCOUNTER — Other Ambulatory Visit: Payer: Self-pay

## 2023-05-16 ENCOUNTER — Emergency Department (HOSPITAL_COMMUNITY): Payer: Medicaid Other

## 2023-05-16 ENCOUNTER — Encounter (HOSPITAL_COMMUNITY): Payer: Self-pay

## 2023-05-16 DIAGNOSIS — R059 Cough, unspecified: Secondary | ICD-10-CM | POA: Insufficient documentation

## 2023-05-16 DIAGNOSIS — J45909 Unspecified asthma, uncomplicated: Secondary | ICD-10-CM | POA: Insufficient documentation

## 2023-05-16 DIAGNOSIS — E876 Hypokalemia: Secondary | ICD-10-CM | POA: Insufficient documentation

## 2023-05-16 DIAGNOSIS — R0989 Other specified symptoms and signs involving the circulatory and respiratory systems: Secondary | ICD-10-CM | POA: Insufficient documentation

## 2023-05-16 DIAGNOSIS — Z1152 Encounter for screening for COVID-19: Secondary | ICD-10-CM | POA: Insufficient documentation

## 2023-05-16 LAB — TROPONIN I (HIGH SENSITIVITY): Troponin I (High Sensitivity): <2 ng/L (ref <18)

## 2023-05-16 LAB — BASIC METABOLIC PANEL
Anion gap: 10 (ref 5–15)
BUN: 9 mg/dL (ref 6–20)
CO2: 30 mmol/L (ref 22–32)
Calcium: 8.8 mg/dL — ABNORMAL LOW (ref 8.9–10.3)
Chloride: 98 mmol/L (ref 98–111)
Creatinine, Ser: 0.71 mg/dL (ref 0.44–1.00)
GFR, Estimated: >60 mL/min (ref >60)
Glucose, Bld: 106 mg/dL — ABNORMAL HIGH (ref 70–99)
Potassium: 2.6 mmol/L — CL (ref 3.5–5.1)
Sodium: 138 mmol/L (ref 135–145)

## 2023-05-16 LAB — CBC
HCT: 30.1 % — ABNORMAL LOW (ref 36.0–46.0)
Hemoglobin: 8.8 g/dL — ABNORMAL LOW (ref 12.0–15.0)
MCH: 19.9 pg — ABNORMAL LOW (ref 26.0–34.0)
MCHC: 29.2 g/dL — ABNORMAL LOW (ref 30.0–36.0)
MCV: 68.1 fL — ABNORMAL LOW (ref 80.0–100.0)
Platelets: 314 10*3/uL (ref 150–400)
RBC: 4.42 MIL/uL (ref 3.87–5.11)
RDW: 17.1 % — ABNORMAL HIGH (ref 11.5–15.5)
WBC: 8.8 10*3/uL (ref 4.0–10.5)
nRBC: 0 % (ref 0.0–0.2)

## 2023-05-16 LAB — HCG, SERUM, QUALITATIVE: Preg, Serum: NEGATIVE

## 2023-05-16 LAB — SARS CORONAVIRUS 2 BY RT PCR: SARS Coronavirus 2 by RT PCR: NEGATIVE

## 2023-05-16 MED ORDER — PREDNISONE 20 MG PO TABS
60.0000 mg | ORAL_TABLET | Freq: Once | ORAL | Status: AC
  Administered 2023-05-16: 60 mg via ORAL
  Filled 2023-05-16: qty 3

## 2023-05-16 MED ORDER — IPRATROPIUM BROMIDE 0.02 % IN SOLN
0.5000 mg | Freq: Once | RESPIRATORY_TRACT | Status: AC
  Administered 2023-05-16: 0.5 mg via RESPIRATORY_TRACT
  Filled 2023-05-16: qty 2.5

## 2023-05-16 MED ORDER — PREDNISONE 50 MG PO TABS: ORAL_TABLET | ORAL | 0 refills | Status: DC

## 2023-05-16 MED ORDER — POTASSIUM CHLORIDE ER 10 MEQ PO TBCR: 10.0000 meq | EXTENDED_RELEASE_TABLET | Freq: Two times a day (BID) | ORAL | 0 refills | Status: DC

## 2023-05-16 MED ORDER — ALBUTEROL SULFATE (2.5 MG/3ML) 0.083% IN NEBU
2.5000 mg | INHALATION_SOLUTION | RESPIRATORY_TRACT | Status: AC
  Administered 2023-05-16: 2.5 mg via RESPIRATORY_TRACT
  Filled 2023-05-16: qty 3

## 2023-05-16 MED ORDER — POTASSIUM CHLORIDE CRYS ER 20 MEQ PO TBCR
60.0000 meq | EXTENDED_RELEASE_TABLET | Freq: Once | ORAL | Status: AC
  Administered 2023-05-16: 60 meq via ORAL
  Filled 2023-05-16: qty 3

## 2023-05-16 NOTE — ED Triage Notes (Signed)
Pt arrived POV reporting SOB, cough and chest tightness for a few days. States feels very congested. Denies recent illness or known exposure. Has hx of asthma. Reports has taken treatments today and routine meds, no relief. Patient appears to be in NAD in triage. Resp even and unlabored. Able to speak in full sentences.  Standing orders initiated.

## 2023-05-16 NOTE — ED Notes (Signed)
Pt lung sounds exhibit clear in all fields following breathing treatment, pt expresses relief following.

## 2023-05-16 NOTE — ED Provider Notes (Signed)
[place] EMERGENCY DEPARTMENT AT [facility] Provider Note   CSN: [id number] Arrival date & time: 05/16/23  1629     History  Chief Complaint  Patient presents with   Chest Pain   Cough   Nasal Congestion    Lori Black is a 37 y.o. female.  37 year old female with history of asthma presents with cough congestion times several days.  Notes that she has discomfort with coughing.  Denies any fever or chills.  Has been using her home albuterol with temporary relief.  No vomiting or diarrhea.  Some nasal congestion noted.       Home Medications Prior to Admission medications   Medication Sig Start Date End Date Taking? Authorizing Provider  albuterol (PROAIR HFA) 108 (90 Base) MCG/ACT inhaler 2 puffs every 4 hours as needed only  if your can't catch your breath 03/10/23   Nyoka Cowden, MD  albuterol (PROVENTIL) (2.5 MG/3ML) 0.083% nebulizer solution Take 3 mLs (2.5 mg total) by nebulization every 4 (four) hours as needed for wheezing or shortness of breath. 02/10/23   Nyoka Cowden, MD  Budeson-Glycopyrrol-Formoterol (BREZTRI AEROSPHERE) 160-9-4.8 MCG/ACT AERO Inhale 2 puffs into the lungs in the morning and at bedtime. 08/02/22   Nyoka Cowden, MD  Budeson-Glycopyrrol-Formoterol (BREZTRI AEROSPHERE) 160-9-4.8 MCG/ACT AERO Inhale 2 puffs into the lungs in the morning and at bedtime. 03/16/23   Nyoka Cowden, MD  Phenylephrine-APAP-guaiFENesin (TYLENOL SINUS SEVERE) 5-325-200 MG TABS Take 1 tablet by mouth daily as needed (For sinus).    [provider]      Allergies    Patient has no known allergies.    Review of Systems   Review of Systems  All other systems reviewed and are negative.   Physical Exam Updated Vital Signs BP 110/75   Pulse 85   Temp 98.8 F (37.1 C) (Oral)   Resp 17   Ht 1.676 m (5\' 6" )   Wt 64 kg   LMP 05/09/2023   SpO2 100%   BMI 22.77 kg/m  Physical Exam Vitals and nursing note reviewed.  Constitutional:       General: She is not in acute distress.    Appearance: Normal appearance. She is well-developed. She is not toxic-appearing.  HENT:     Head: Normocephalic and atraumatic.  Eyes:     General: Lids are normal.     Conjunctiva/sclera: Conjunctivae normal.     Pupils: Pupils are equal, round, and reactive to light.  Neck:     Thyroid: No thyroid mass.     Trachea: No tracheal deviation.  Cardiovascular:     Rate and Rhythm: Normal rate and regular rhythm.     Heart sounds: Normal heart sounds. No murmur heard.    No gallop.  Pulmonary:     Effort: Pulmonary effort is normal. No tachypnea or respiratory distress.     Breath sounds: Normal breath sounds. Decreased air movement present. No stridor. No decreased breath sounds, wheezing, rhonchi or rales.  Abdominal:     General: There is no distension.     Palpations: Abdomen is soft.     Tenderness: There is no abdominal tenderness. There is no rebound.  Musculoskeletal:        General: No tenderness. Normal range of motion.     Cervical back: Normal range of motion and neck supple.  Skin:    General: Skin is warm and dry.     Findings: No abrasion or rash.  Neurological:  Mental Status: She is alert and oriented to person, place, and time. Mental status is at baseline.     GCS: GCS eye subscore is 4. GCS verbal subscore is 5. GCS motor subscore is 6.     Cranial Nerves: Cranial nerves are intact. No cranial nerve deficit.     Sensory: No sensory deficit.     Motor: Motor function is intact.  Psychiatric:        Attention and Perception: Attention normal.        Speech: Speech normal.        Behavior: Behavior normal.     ED Results / Procedures / Treatments   Labs (all labs ordered are listed, but only abnormal results are displayed) Labs Reviewed  BASIC METABOLIC PANEL - Abnormal; Notable for the following components:      Result Value   Potassium 2.6 (*)    Glucose, Bld 106 (*)    Calcium 8.8 (*)    All other  components within normal limits  CBC - Abnormal; Notable for the following components:   Hemoglobin 8.8 (*)    HCT 30.1 (*)    MCV 68.1 (*)    MCH 19.9 (*)    MCHC 29.2 (*)    RDW 17.1 (*)    All other components within normal limits  SARS CORONAVIRUS 2 BY RT PCR  HCG, SERUM, QUALITATIVE  TROPONIN I (HIGH SENSITIVITY)  TROPONIN I (HIGH SENSITIVITY)    EKG EKG Interpretation Date/Time:  Tuesday May 16 2023 16:51:32 EDT Ventricular Rate:  96 PR Interval:  140 QRS Duration:  92 QT Interval:  378 QTC Calculation: 477 R Axis:   265  Text Interpretation: Normal sinus rhythm Low voltage QRS Possible Anterolateral infarct , age undetermined Abnormal ECG When compared with ECG of 31-Jul-2022 12:47, PREVIOUS ECG IS PRESENT No significant change since last tracing Confirmed by Lorre Nick ([id number]) on 05/16/2023 8:43:13 PM  Radiology DG Chest 2 View  Result Date: 05/16/2023 CLINICAL DATA:  Chest pain EXAM: CHEST - 2 VIEW COMPARISON:  07/31/2022 FINDINGS: The heart size and mediastinal contours are within normal limits. Both lungs are clear. The visualized skeletal structures are unremarkable. IMPRESSION: No active cardiopulmonary disease. Electronically Signed   By: Jasmine Pang M.D.   On: 05/16/2023 18:09    Procedures Procedures    Medications Ordered in ED Medications  potassium chloride SA (KLOR-CON M) CR tablet 60 mEq (has no administration in time range)  predniSONE (DELTASONE) tablet 60 mg (has no administration in time range)  ipratropium (ATROVENT) nebulizer solution 0.5 mg (has no administration in time range)  albuterol (PROVENTIL) (2.5 MG/3ML) 0.083% nebulizer solution 2.5 mg (has no administration in time range)    ED Course/ Medical Decision Making/ A&P Clinical Course as of 05/16/23 2059  Tue May 16, 2023  1926 Hemoglobin(!): 8.8 [NF]    Clinical Course User Index [NF] Rejeana Brock, [place]                                 Medical Decision  Making Amount and/or Complexity of Data Reviewed Labs: ordered. Radiology: ordered.  Risk Prescription drug management.  Patient mild hypokalemia and treated with oral potassium. Patient wheezing here and given albuterol with Atrovent along with prednisone.  Feels better.  COVID test is negative.  Chest x-ray without acute findings.  Parent interpretation.  Suspect asthma exacerbation.  Will place on prednisone as well as give 3  days of oral potassium.        Final Clinical Impression(s) / ED Diagnoses Final diagnoses:  None    Rx / DC Orders ED Discharge Orders     None         Lorre Nick, MD 05/16/23 2224

## 2023-07-04 ENCOUNTER — Encounter (HOSPITAL_COMMUNITY): Payer: Self-pay

## 2023-07-04 ENCOUNTER — Emergency Department (HOSPITAL_COMMUNITY)
Admission: EM | Admit: 2023-07-04 | Discharge: 2023-07-04 | Disposition: A | Discharge status: Home / Self Care | Payer: Medicare Other | Attending: Emergency Medicine | Admitting: Emergency Medicine

## 2023-07-04 ENCOUNTER — Other Ambulatory Visit: Payer: Self-pay

## 2023-07-04 DIAGNOSIS — M25562 Pain in left knee: Secondary | ICD-10-CM | POA: Diagnosis present

## 2023-07-04 DIAGNOSIS — M25462 Effusion, left knee: Secondary | ICD-10-CM | POA: Insufficient documentation

## 2023-07-04 MED ORDER — PREDNISONE 50 MG PO TABS: ORAL_TABLET | ORAL | 0 refills | Status: DC

## 2023-07-04 NOTE — ED Triage Notes (Signed)
Pt arrived Reporting L knee pain and swelling x 1 day. Atraumatic. Reports this happen before and had to drain. Denies fever, chill or any other symptoms

## 2023-07-04 NOTE — ED Provider Notes (Signed)
[place] EMERGENCY DEPARTMENT AT [facility] Provider Note   CSN: [id number] Arrival date & time: 07/04/23  0844     History  Chief Complaint  Patient presents with   Knee Pain   Joint Swelling    Lori Black is a 37 y.o. female.  Patient complains of swelling to her left knee.  Patient denies any injury.  She denies any redness or swelling.  Patient reports that knee is stiff and she noticed some swelling to the medial aspect of her knee.  Patient reports that she has had similar episodes in the past where her knee became very swollen and hot.  Patient reports that she has had 2 prior episodes where she had to have a needle placed to evaluate the fluid in her knee.  Had to previous taps to evaluate for septic joint.  Patient had negative cultures.  Patient tells me that this is not as bad as previous episodes and that her whole knee was swollen then.  Patient reports some soreness with movement.  Patient has not had a fever or chills she denies any body aches.  The history is provided by the patient. No language interpreter was used.  Knee Pain Location:  Knee Time since incident:  1 day Knee location:  L knee Pain details:    Quality:  Aching   Radiates to:  Does not radiate   Timing:  Constant   Progression:  Worsening Chronicity:  New Risk factors: no concern for non-accidental trauma        Home Medications Prior to Admission medications   Medication Sig Start Date End Date Taking? Authorizing Provider  albuterol (PROAIR HFA) 108 (90 Base) MCG/ACT inhaler 2 puffs every 4 hours as needed only  if your can't catch your breath 03/10/23   Nyoka Cowden, MD  albuterol (PROVENTIL) (2.5 MG/3ML) 0.083% nebulizer solution Take 3 mLs (2.5 mg total) by nebulization every 4 (four) hours as needed for wheezing or shortness of breath. 02/10/23   Nyoka Cowden, MD  Budeson-Glycopyrrol-Formoterol (BREZTRI AEROSPHERE) 160-9-4.8 MCG/ACT AERO Inhale 2 puffs into the lungs  in the morning and at bedtime. 08/02/22   Nyoka Cowden, MD  Budeson-Glycopyrrol-Formoterol (BREZTRI AEROSPHERE) 160-9-4.8 MCG/ACT AERO Inhale 2 puffs into the lungs in the morning and at bedtime. 03/16/23   Nyoka Cowden, MD  Phenylephrine-APAP-guaiFENesin (TYLENOL SINUS SEVERE) 5-325-200 MG TABS Take 1 tablet by mouth daily as needed (For sinus).    [provider]  potassium chloride (KLOR-CON) 10 MEQ tablet Take 1 tablet (10 mEq total) by mouth 2 (two) times daily. 05/16/23   Lorre Nick, MD  predniSONE (DELTASONE) 50 MG tablet 1 p.o. daily x 5 05/16/23   Lorre Nick, MD      Allergies    Patient has no known allergies.    Review of Systems   Review of Systems  All other systems reviewed and are negative.   Physical Exam Updated Vital Signs BP 103/62 (BP Location: Right Arm)   Pulse 92   Temp 98.5 F (36.9 C) (Oral)   Resp 16   Ht 5\' 3"  (1.6 m)   Wt 63.5 kg   SpO2 97%   BMI 24.80 kg/m  Physical Exam Vitals reviewed.  Constitutional:      Appearance: Normal appearance.  Cardiovascular:     Rate and Rhythm: Normal rate.  Musculoskeletal:        General: Swelling present.     Comments: Patient has slight swelling anterior left  knee no effusion no erythema.  Slight decreased range of motion with some discomfort neurovascular neurosensory are intact  Skin:    General: Skin is warm.  Neurological:     General: No focal deficit present.     Mental Status: She is alert.     ED Results / Procedures / Treatments   Labs (all labs ordered are listed, but only abnormal results are displayed) Labs Reviewed - No data to display  EKG None  Radiology No results found.  Procedures Procedures    Medications Ordered in ED Medications - No data to display  ED Course/ Medical Decision Making/ A&P                                 Medical Decision Making Patient complains of swelling and discomfort to her left knee  Risk Prescription drug  management. Risk Details: Patient has had 2 previous evaluations for septic joint.  Both incidences showed negative culture.  Patient does not appear to have any type of septic joint at this time she does not have an effusion she does not have any erythema she is not febrile.  Patient has been seen by Dr. Carola Frost in the past.  I discussed with the patient I will treat her conservatively at this time patient is placed in a knee sleeve she is advised to call Dr. Carola Frost to schedule an appointment.  I counseled her at length on need to return to the emergency department if she has increased redness increased swelling fever or other changes.           Final Clinical Impression(s) / ED Diagnoses Final diagnoses:  Acute pain of left knee    Rx / DC Orders ED Discharge Orders          Ordered    predniSONE (DELTASONE) 50 MG tablet        07/04/23 0931              Elson Areas, PA-C 07/04/23 0931    Loetta Rough, MD 07/04/23 1014

## 2023-07-04 NOTE — Progress Notes (Signed)
Orthopedic Tech Progress Note Patient Details:  Lori Black [birth year] [id number]  Ortho Devices Type of Ortho Device: Knee Sleeve Ortho Device/Splint Location: left hinged knee brace applied Ortho Device/Splint Interventions: Ordered, Application, Adjustment   Post Interventions Patient Tolerated: Well Instructions Provided: Adjustment of device, Care of device  [name] 07/04/2023, 9:44 AM

## 2023-08-12 ENCOUNTER — Other Ambulatory Visit: Payer: Self-pay

## 2023-08-12 ENCOUNTER — Encounter (HOSPITAL_COMMUNITY): Payer: Self-pay

## 2023-08-12 ENCOUNTER — Emergency Department (HOSPITAL_COMMUNITY): Payer: Medicare Other

## 2023-08-12 ENCOUNTER — Emergency Department (HOSPITAL_COMMUNITY)
Admission: EM | Admit: 2023-08-12 | Discharge: 2023-08-12 | Disposition: A | Discharge status: Home / Self Care | Payer: Medicare Other | Attending: Emergency Medicine | Admitting: Emergency Medicine

## 2023-08-12 DIAGNOSIS — R509 Fever, unspecified: Secondary | ICD-10-CM | POA: Insufficient documentation

## 2023-08-12 DIAGNOSIS — M25562 Pain in left knee: Secondary | ICD-10-CM | POA: Diagnosis present

## 2023-08-12 DIAGNOSIS — R0981 Nasal congestion: Secondary | ICD-10-CM | POA: Insufficient documentation

## 2023-08-12 DIAGNOSIS — M25462 Effusion, left knee: Secondary | ICD-10-CM | POA: Diagnosis not present

## 2023-08-12 LAB — SEDIMENTATION RATE: Sed Rate: 28 mm/h — ABNORMAL HIGH (ref 0–22)

## 2023-08-12 LAB — SYNOVIAL CELL COUNT + DIFF, W/ CRYSTALS
Crystals, Fluid: NONE SEEN
Eosinophils-Synovial: 0 % (ref 0–1)
Lymphocytes-Synovial Fld: 0 % (ref 0–20)
Monocyte-Macrophage-Synovial Fluid: 6 % — ABNORMAL LOW (ref 50–90)
Neutrophil, Synovial: 94 % — ABNORMAL HIGH (ref 0–25)
WBC, Synovial: 13040 /mm3 — ABNORMAL HIGH (ref 0–200)

## 2023-08-12 LAB — CBC WITH DIFFERENTIAL/PLATELET
Abs Immature Granulocytes: 0.05 10*3/uL (ref 0.00–0.07)
Basophils Absolute: 0.1 10*3/uL (ref 0.0–0.1)
Basophils Relative: 1 %
Eosinophils Absolute: 0.1 10*3/uL (ref 0.0–0.5)
Eosinophils Relative: 1 %
HCT: 26.6 % — ABNORMAL LOW (ref 36.0–46.0)
Hemoglobin: 7.9 g/dL — ABNORMAL LOW (ref 12.0–15.0)
Immature Granulocytes: 1 %
Lymphocytes Relative: 17 %
Lymphs Abs: 1.5 10*3/uL (ref 0.7–4.0)
MCH: 20.2 pg — ABNORMAL LOW (ref 26.0–34.0)
MCHC: 29.7 g/dL — ABNORMAL LOW (ref 30.0–36.0)
MCV: 68 fL — ABNORMAL LOW (ref 80.0–100.0)
Monocytes Absolute: 0.7 10*3/uL (ref 0.1–1.0)
Monocytes Relative: 8 %
Neutro Abs: 6.4 10*3/uL (ref 1.7–7.7)
Neutrophils Relative %: 72 %
Platelets: 290 10*3/uL (ref 150–400)
RBC: 3.91 MIL/uL (ref 3.87–5.11)
RDW: 16.9 % — ABNORMAL HIGH (ref 11.5–15.5)
WBC: 8.7 10*3/uL (ref 4.0–10.5)
nRBC: 0 % (ref 0.0–0.2)

## 2023-08-12 LAB — COMPREHENSIVE METABOLIC PANEL
ALT: 13 U/L (ref 0–44)
AST: 11 U/L — ABNORMAL LOW (ref 15–41)
Albumin: 3.7 g/dL (ref 3.5–5.0)
Alkaline Phosphatase: 55 U/L (ref 38–126)
Anion gap: 10 (ref 5–15)
BUN: 13 mg/dL (ref 6–20)
CO2: 24 mmol/L (ref 22–32)
Calcium: 8.3 mg/dL — ABNORMAL LOW (ref 8.9–10.3)
Chloride: 98 mmol/L (ref 98–111)
Creatinine, Ser: 0.68 mg/dL (ref 0.44–1.00)
GFR, Estimated: >60 mL/min (ref >60)
Glucose, Bld: 96 mg/dL (ref 70–99)
Potassium: 3.2 mmol/L — ABNORMAL LOW (ref 3.5–5.1)
Sodium: 132 mmol/L — ABNORMAL LOW (ref 135–145)
Total Bilirubin: 0.9 mg/dL (ref 0.3–1.2)
Total Protein: 7.4 g/dL (ref 6.5–8.1)

## 2023-08-12 MED ORDER — IBUPROFEN 200 MG PO TABS
600.0000 mg | ORAL_TABLET | Freq: Once | ORAL | Status: AC
  Administered 2023-08-12: 600 mg via ORAL
  Filled 2023-08-12: qty 3

## 2023-08-12 MED ORDER — LIDOCAINE-EPINEPHRINE 2 %-1:100000 IJ SOLN
20.0000 mL | Freq: Once | INTRAMUSCULAR | Status: AC
  Administered 2023-08-12: 20 mL via INTRADERMAL
  Filled 2023-08-12: qty 1

## 2023-08-12 NOTE — Discharge Instructions (Signed)
You are seen in the emergency department today for concerns of left knee pain and swelling.  Your labs did show an elevation of your sedimentation rate which could be due to an inflammatory process.  Joint aspiration was performed with fluid analysis done which did not show any signs of septic arthritis.  I would recommend alternating between Tylenol ibuprofen for management of your pain.  Also try to elevate the knee and ice it as needed.  Please follow-up with Dr. Carola Frost with orthopedics for repeat assessment.  If any signs of infection begin to develop or worsen such as redness, fevers, return the emergency department.

## 2023-08-12 NOTE — ED Provider Notes (Signed)
[place] EMERGENCY DEPARTMENT AT [facility] Provider Note   CSN: [id number] Arrival date & time: 08/12/23  1455     History Chief Complaint  Patient presents with   Knee Pain    Lori Black is a 37 y.o. female.  Patient with past history significant for symptom arthritis of bilateral knees presents to the emergency department concerns of left knee pain and swelling for the last 2 days.  Denies any recent injury, trauma, or other cause to the swelling.  History taking Tylenol at home without any relief in symptoms.  Does also endorse subjective fevers about 2 or 3 days ago but this also was coinciding with some increased congestion and runny nose.  States that pain at rest is 7 out of 10 and worsens to 9 out of 10 when bearing weight and try to walk.   Knee Pain      Home Medications Prior to Admission medications   Medication Sig Start Date End Date Taking? Authorizing Provider  albuterol (PROAIR HFA) 108 (90 Base) MCG/ACT inhaler 2 puffs every 4 hours as needed only  if your can't catch your breath 03/10/23   Nyoka Cowden, MD  albuterol (PROVENTIL) (2.5 MG/3ML) 0.083% nebulizer solution Take 3 mLs (2.5 mg total) by nebulization every 4 (four) hours as needed for wheezing or shortness of breath. 02/10/23   Nyoka Cowden, MD  Budeson-Glycopyrrol-Formoterol (BREZTRI AEROSPHERE) 160-9-4.8 MCG/ACT AERO Inhale 2 puffs into the lungs in the morning and at bedtime. 08/02/22   Nyoka Cowden, MD  Budeson-Glycopyrrol-Formoterol (BREZTRI AEROSPHERE) 160-9-4.8 MCG/ACT AERO Inhale 2 puffs into the lungs in the morning and at bedtime. 03/16/23   Nyoka Cowden, MD  Phenylephrine-APAP-guaiFENesin (TYLENOL SINUS SEVERE) 5-325-200 MG TABS Take 1 tablet by mouth daily as needed (For sinus).    [provider]  potassium chloride (KLOR-CON) 10 MEQ tablet Take 1 tablet (10 mEq total) by mouth 2 (two) times daily. 05/16/23   Lorre Nick, MD  predniSONE (DELTASONE) 50 MG  tablet One tablet a day for 5 days 07/04/23   Elson Areas, PA-C      Allergies    Patient has no known allergies.    Review of Systems   Review of Systems  Musculoskeletal:  Positive for joint swelling.  All other systems reviewed and are negative.   Physical Exam Updated Vital Signs BP 114/73 (BP Location: Left Arm)   Pulse 100   Temp 98.7 F (37.1 C) (Oral)   Resp 16   Ht 5\' 3"  (1.6 m)   Wt 63.5 kg   SpO2 100%   BMI 24.80 kg/m  Physical Exam Vitals and nursing note reviewed.  Constitutional:      General: She is not in acute distress.    Appearance: She is well-developed.  HENT:     Head: Normocephalic and atraumatic.  Eyes:     Conjunctiva/sclera: Conjunctivae normal.  Cardiovascular:     Rate and Rhythm: Normal rate and regular rhythm.     Heart sounds: No murmur heard. Pulmonary:     Effort: Pulmonary effort is normal. No respiratory distress.     Breath sounds: Normal breath sounds.  Abdominal:     Palpations: Abdomen is soft.     Tenderness: There is no abdominal tenderness.  Musculoskeletal:        General: Swelling and tenderness present. No deformity or signs of injury.     Cervical back: Neck supple.       Legs:  Comments: Swelling to the lateral and medial aspects of left knee, no erythema or warmth  Skin:    General: Skin is warm and dry.     Capillary Refill: Capillary refill takes less than 2 seconds.  Neurological:     Mental Status: She is alert.  Psychiatric:        Mood and Affect: Mood normal.     ED Results / Procedures / Treatments   Labs (all labs ordered are listed, but only abnormal results are displayed) Labs Reviewed  CBC WITH DIFFERENTIAL/PLATELET  SEDIMENTATION RATE  COMPREHENSIVE METABOLIC PANEL    EKG None  Radiology No results found.  Procedures Procedures   Medications Ordered in ED Medications - No data to display  ED Course/ Medical Decision Making/ A&P                               Medical  Decision Making Amount and/or Complexity of Data Reviewed Labs: ordered. Radiology: ordered.   This patient presents to the ED for concern of *** differential diagnosis includes ***    Additional history obtained:  Additional history obtained from *** External records from outside source obtained and reviewed including ***   Lab Tests:  I Ordered, and personally interpreted labs.  The pertinent results include:  ***   Imaging Studies ordered:  I ordered imaging studies including ***  I independently visualized and interpreted imaging which showed *** I agree with the radiologist interpretation   Medicines ordered and prescription drug management:  I ordered medication including ***  for ***  Reevaluation of the patient after these medicines showed that the patient {resolved/improved/worsened:23923::"improved"} I have reviewed the patients home medicines and have made adjustments as needed   Problem List / ED Course:  ***   Social Determinants of Health:   Final Clinical Impression(s) / ED Diagnoses Final diagnoses:  None    Rx / DC Orders ED Discharge Orders     None

## 2023-08-12 NOTE — ED Provider Notes (Signed)
37 year old female with history of prior bilateral septic arthritis of the knee presenting for left knee pain.  Atraumatic, she has some pain and swelling to the left leg.  On exam she has likely small effusion, neurovascularly intact.  No erythema, warmth.  Range of motion slightly limited due to pain.  X-ray, labs are pending.  She is no fever or skin changes, however has had prior septic arthritis before.  ESR slightly elevated.  No leukocytosis, no fever.  X-ray with moderate joint effusion without fracture.  Discussed with patient, given history of prior septic arthritis I do think arthrocentesis is indicated.  Consent obtained and fluid obtained below.  Synovial fluid analysis shows elevated white blood cell count, neutrophil predominant.  However no crystals or organisms were seen on Gram stain.  Her synovial fluid count was more consistent with inflammatory cause of septic arthritis.  I have lower suspicion for infectious cause given negative Gram stain with no leukocytosis or fever.  Discussed with patient, recommend she follow close with orthopedist and PCP.  Strict turn precautions given for worsening pain, swelling, fever or any other new concerning symptoms. .Joint Aspiration/Arthrocentesis  Date/Time: 08/12/2023 5:36 PM  Performed by: Laurence Spates, MD Authorized by: Laurence Spates, MD   Consent:    Consent obtained:  Verbal   Consent given by:  Patient   Risks, benefits, and alternatives were discussed: yes     Risks discussed:  Bleeding, incomplete drainage, pain, poor cosmetic result, infection and nerve damage   Alternatives discussed:  No treatment Universal protocol:    Site/side marked: yes     Immediately prior to procedure, a time out was called: yes     Patient identity confirmed:  Verbally with patient and arm band Location:    Location:  Knee   Knee:  L knee Anesthesia:    Anesthesia method:  Local infiltration   Local anesthetic:  Lidocaine 2% WITH  epi Procedure details:    Preparation: Patient was prepped and draped in usual sterile fashion     Needle gauge:  18 G   Ultrasound guidance: yes     Approach:  Superior   Aspirate amount:  20 mL   Aspirate characteristics:  Yellow   Steroid injected: no     Specimen collected: yes   Post-procedure details:    Dressing:  Adhesive bandage   Procedure completion:  Tolerated well, no immediate complications     Laurence Spates, MD 08/13/23 0010

## 2023-08-12 NOTE — ED Triage Notes (Signed)
Patient reports left knee pain and swelling x 2 days. Reports this happens every few months and has been seen previously for same.

## 2023-08-15 LAB — GLUCOSE, BODY FLUID OTHER: Glucose, Body Fluid Other: 75 mg/dL

## 2023-08-15 LAB — PROTEIN, BODY FLUID (OTHER): Total Protein, Body Fluid Other: 4.1 g/dL

## 2023-08-16 LAB — BODY FLUID CULTURE W GRAM STAIN: Culture: NO GROWTH

## 2024-05-03 ENCOUNTER — Other Ambulatory Visit: Payer: Self-pay | Admitting: Internal Medicine

## 2024-05-03 NOTE — Telephone Encounter (Signed)
 Copied from CRM [id number]. Topic: Clinical - Medication Refill >> May 03, 2024 12:47 PM Corean SAUNDERS wrote: Medication: Budeson-Glycopyrrol-Formoterol  (BREZTRI  AEROSPHERE) 160-9-4.8 MCG/ACT AERO  Has the patient contacted their pharmacy? No, out of refills (Agent: If no, request that the patient contact the pharmacy for the refill. If patient does not wish to contact the pharmacy document the reason why and proceed with request.) (Agent: If yes, when and what did the pharmacy advise?)  This is the patient's preferred pharmacy:  [facility] [address] [place] - [address] [place] [address] Phone: [phone] Fax: [phone]  Is this the correct pharmacy for this prescription? Yes If no, delete pharmacy and type the correct one.   Has the prescription been filled recently? Yes  Is the patient out of the medication? Yes  Has the patient been seen for an appointment in the last year OR does the patient have an upcoming appointment? yes  Can we respond through MyChart? No  Agent: Please be advised that Rx refills may take up to 3 business days. We ask that you follow-up with your pharmacy.

## 2024-05-06 MED ORDER — BREZTRI AEROSPHERE 160-9-4.8 MCG/ACT IN AERO: 2.0000 | INHALATION_SPRAY | Freq: Two times a day (BID) | RESPIRATORY_TRACT | 1 refills | Status: DC

## 2024-05-11 ENCOUNTER — Encounter (HOSPITAL_COMMUNITY): Payer: Self-pay | Admitting: Emergency Medicine

## 2024-05-11 ENCOUNTER — Other Ambulatory Visit: Payer: Self-pay

## 2024-05-11 ENCOUNTER — Emergency Department (HOSPITAL_COMMUNITY)
Admission: EM | Admit: 2024-05-11 | Discharge: 2024-05-12 | Disposition: A | Discharge status: Home / Self Care | Attending: Emergency Medicine | Admitting: Emergency Medicine

## 2024-05-11 DIAGNOSIS — J45909 Unspecified asthma, uncomplicated: Secondary | ICD-10-CM | POA: Diagnosis not present

## 2024-05-11 DIAGNOSIS — R0981 Nasal congestion: Secondary | ICD-10-CM | POA: Diagnosis present

## 2024-05-11 DIAGNOSIS — J0100 Acute maxillary sinusitis, unspecified: Secondary | ICD-10-CM | POA: Insufficient documentation

## 2024-05-11 NOTE — ED Triage Notes (Signed)
 Pt reports several days of sinus congestion, pain, and bilateral ear pain.  No fever/chills/n/v/d

## 2024-05-12 MED ORDER — PSEUDOEPHEDRINE HCL 30 MG PO TABS: 30.0000 mg | ORAL_TABLET | ORAL | 0 refills | Status: DC | PRN

## 2024-05-12 MED ORDER — OXYMETAZOLINE HCL 0.05 % NA SOLN
1.0000 | Freq: Once | NASAL | Status: AC
  Administered 2024-05-12: 1 via NASAL
  Filled 2024-05-12: qty 30

## 2024-05-12 NOTE — ED Provider Notes (Signed)
 [place] EMERGENCY DEPARTMENT AT [facility] Provider Note   CSN: [id number] Arrival date & time: 05/11/24  2250     Patient presents with: Nasal Congestion and Otalgia   Lori Black is a 38 y.o. female.   The history is provided by the patient.  Otalgia Lori Black is a 38 y.o. female who presents to the Emergency Department complaining of congestion.  She presents to the emergency department for evaluation of 2 to 3 days of nasal/sinus congestion, worse on the left with associated bilateral ear fullness, left worse than right.  She feels the ear discomfort greater when she chews.  She has mild cough.  No fevers, difficulty breathing, nausea, vomiting.  She does have a history of asthma.  No additional medical problems.     Prior to Admission medications   Medication Sig Start Date End Date Taking? Authorizing Provider  pseudoephedrine  (SUDAFED) 30 MG tablet Take 1 tablet (30 mg total) by mouth every 4 (four) hours as needed for congestion. 05/12/24  Yes Griselda Norris, MD  albuterol  (PROAIR  HFA) 108 [phone] Base) MCG/ACT inhaler 2 puffs every 4 hours as needed only  if your can't catch your breath 03/10/23   Darlean Ozell NOVAK, MD  albuterol  (PROVENTIL ) (2.5 MG/3ML) 0.083% nebulizer solution Take 3 mLs (2.5 mg total) by nebulization every 4 (four) hours as needed for wheezing or shortness of breath. 02/10/23   Darlean Ozell NOVAK, MD  budesonide -glycopyrrolate-formoterol  (BREZTRI  AEROSPHERE) 160-9-4.8 MCG/ACT AERO inhaler Inhale 2 puffs into the lungs in the morning and at bedtime. 05/06/24   Darlean Ozell NOVAK, MD  Phenylephrine -APAP-guaiFENesin (TYLENOL  SINUS SEVERE) 5-325-200 MG TABS Take 1 tablet by mouth daily as needed (For sinus).    [provider]  potassium chloride  (KLOR-CON ) 10 MEQ tablet Take 1 tablet (10 mEq total) by mouth 2 (two) times daily. 05/16/23   Dasie Faden, MD  predniSONE  (DELTASONE ) 50 MG tablet One tablet a day for 5 days 07/04/23   Flint Sonny POUR,  PA-C    Allergies: Patient has no known allergies.    Review of Systems  HENT:  Positive for ear pain.   All other systems reviewed and are negative.   Updated Vital Signs BP (!) 146/64 (BP Location: Left Arm)   Pulse 78   Temp 98.7 F (37.1 C) (Oral)   Resp 18   SpO2 99%   Physical Exam Vitals and nursing note reviewed.  Constitutional:      Appearance: She is well-developed.  HENT:     Head: Normocephalic and atraumatic.     Comments: TMs without erythema bilaterally.  No significant erythema or edema in the posterior oropharynx.  There is boggy and edematous nasal mucosa, left greater than right with mild clear nasal discharge Cardiovascular:     Rate and Rhythm: Normal rate and regular rhythm.     Heart sounds: No murmur heard. Pulmonary:     Effort: Pulmonary effort is normal. No respiratory distress.     Breath sounds: Normal breath sounds.  Musculoskeletal:        General: No tenderness.  Skin:    General: Skin is warm and dry.  Neurological:     Mental Status: She is alert and oriented to person, place, and time.  Psychiatric:        Behavior: Behavior normal.     (all labs ordered are listed, but only abnormal results are displayed) Labs Reviewed - No data to display  EKG: None  Radiology: No results found.  Procedures   Medications Ordered in the ED  oxymetazoline  (AFRIN) 0.05 % nasal spray 1 spray (has no administration in time range)                                    Medical Decision Making Risk OTC drugs.   Patient here for evaluation of nasal congestion, left ear pain.  She is nontoxic-appearing on evaluation.  Examination is not consistent with pneumonia, acute otitis media, RPA, PTA, epiglottitis.  Suspect she has viral sinusitis.  Discussed symptomatic measures she may take with Afrin nasal spray, Sudafed.  Discussed as needed pain medications.  Discussed outpatient follow-up and return precautions.     Final diagnoses:  Acute  non-recurrent maxillary sinusitis    ED Discharge Orders          Ordered    pseudoephedrine  (SUDAFED) 30 MG tablet  Every 4 hours PRN        05/12/24 0003               Griselda Norris, MD 05/12/24 0005

## 2024-05-13 MED ORDER — BREZTRI AEROSPHERE 160-9-4.8 MCG/ACT IN AERO: 2.0000 | INHALATION_SPRAY | Freq: Two times a day (BID) | RESPIRATORY_TRACT | 4 refills | Status: AC

## 2024-05-13 NOTE — Telephone Encounter (Signed)
 Copied from CRM [id number]. Topic: Clinical - Prescription Issue >> May 10, 2024  4:50 PM Lori Black wrote: Reason for CRM: PT CALLED STATED SHE DID NOT RECEIVE THE budesonide -glycopyrrolate-formoterol  (BREZTRI  AEROSPHERE) 160-9-4.8 MCG/ACT AERO inhaler. IT LOOKS LIKE IT WAS SIGNED BUT NOT SENT TO THE PHARMACY. PT STATED SHE IS COMPLETELY OUT OF THE INHALER  I have sent rx to patient preferred pharmacy to last until upcoming ov.  Tried calling the pt to notify- no answer, vm not set up so unable to leave vm

## 2024-06-01 ENCOUNTER — Encounter (HOSPITAL_COMMUNITY): Payer: Self-pay | Admitting: Pharmacy Technician

## 2024-06-01 ENCOUNTER — Other Ambulatory Visit: Payer: Self-pay

## 2024-06-01 ENCOUNTER — Emergency Department (HOSPITAL_COMMUNITY)

## 2024-06-01 ENCOUNTER — Emergency Department (HOSPITAL_COMMUNITY)
Admission: EM | Admit: 2024-06-01 | Discharge: 2024-06-01 | Disposition: A | Discharge status: Home / Self Care | Attending: Emergency Medicine | Admitting: Emergency Medicine

## 2024-06-01 DIAGNOSIS — J45901 Unspecified asthma with (acute) exacerbation: Secondary | ICD-10-CM | POA: Diagnosis not present

## 2024-06-01 DIAGNOSIS — R059 Cough, unspecified: Secondary | ICD-10-CM | POA: Diagnosis present

## 2024-06-01 DIAGNOSIS — Z7951 Long term (current) use of inhaled steroids: Secondary | ICD-10-CM | POA: Diagnosis not present

## 2024-06-01 MED ORDER — PREDNISONE 20 MG PO TABS: 60.0000 mg | ORAL_TABLET | Freq: Every day | ORAL | 0 refills | Status: AC

## 2024-06-01 MED ORDER — PREDNISONE 20 MG PO TABS
60.0000 mg | ORAL_TABLET | Freq: Once | ORAL | Status: AC
  Administered 2024-06-01: 60 mg via ORAL
  Filled 2024-06-01: qty 3

## 2024-06-01 MED ORDER — IPRATROPIUM-ALBUTEROL 0.5-2.5 (3) MG/3ML IN SOLN
3.0000 mL | Freq: Once | RESPIRATORY_TRACT | Status: AC
  Administered 2024-06-01: 3 mL via RESPIRATORY_TRACT
  Filled 2024-06-01: qty 3

## 2024-06-01 NOTE — ED Triage Notes (Signed)
 Pt here with complaints of asthma exacerbation. Has been out of Breztri  inhaler for several weeks. Used nebulizer earlier with some relief.

## 2024-06-01 NOTE — ED Provider Notes (Signed)
 [place] EMERGENCY DEPARTMENT AT [facility] Provider Note   CSN: [id number] Arrival date & time: 06/01/24  1103     Patient presents with: Asthma   Lori Black is a 38 y.o. female.   Patient here with wheezing and cough for the last day or 2.  She has been out of her daily steroid inhaler for several weeks because she cannot afford it currently.  She has prescription to pick up if she is found for her out the cost.  She has been using her nebulizer with some relief.  This feels like her typical asthma exacerbations.  Has not had any chest pain.  Not short of breath.  She denies any weakness numbness tingling.  Denies any fever or chills.  No recent surgery or travel.  Not on any birth control pill.  The history is provided by the patient.       Prior to Admission medications   Medication Sig Start Date End Date Taking? Authorizing Provider  predniSONE  (DELTASONE ) 20 MG tablet Take 3 tablets (60 mg total) by mouth daily for 4 days. 06/01/24 06/05/24 Yes Chesley Valls, DO  albuterol  (PROAIR  HFA) 108 (90 Base) MCG/ACT inhaler 2 puffs every 4 hours as needed only  if your can't catch your breath 03/10/23   Darlean Ozell NOVAK, MD  albuterol  (PROVENTIL ) (2.5 MG/3ML) 0.083% nebulizer solution Take 3 mLs (2.5 mg total) by nebulization every 4 (four) hours as needed for wheezing or shortness of breath. 02/10/23   Darlean Ozell NOVAK, MD  budesonide -glycopyrrolate-formoterol  (BREZTRI  AEROSPHERE) 160-9-4.8 MCG/ACT AERO inhaler Inhale 2 puffs into the lungs in the morning and at bedtime. 05/13/24   Darlean Ozell NOVAK, MD  Phenylephrine -APAP-guaiFENesin (TYLENOL  SINUS SEVERE) 5-325-200 MG TABS Take 1 tablet by mouth daily as needed (For sinus).    [provider]  potassium chloride  (KLOR-CON ) 10 MEQ tablet Take 1 tablet (10 mEq total) by mouth 2 (two) times daily. 05/16/23   Dasie Faden, MD  pseudoephedrine  (SUDAFED) 30 MG tablet Take 1 tablet (30 mg total) by mouth every 4 (four) hours  as needed for congestion. 05/12/24   Griselda Norris, MD    Allergies: Patient has no known allergies.    Review of Systems  Updated Vital Signs BP 128/89 (BP Location: Left Arm)   Pulse 90   Temp 98.9 F (37.2 C) (Oral)   Resp 18   Ht 5' 6 (1.676 m)   Wt 67.1 kg   LMP 05/29/2024   SpO2 100%   BMI 23.89 kg/m   Physical Exam Vitals and nursing note reviewed.  Constitutional:      General: She is not in acute distress.    Appearance: She is well-developed. She is not ill-appearing.  HENT:     Head: Normocephalic and atraumatic.     Nose: Nose normal.     Mouth/Throat:     Mouth: Mucous membranes are moist.  Eyes:     Extraocular Movements: Extraocular movements intact.     Conjunctiva/sclera: Conjunctivae normal.     Pupils: Pupils are equal, round, and reactive to light.  Cardiovascular:     Rate and Rhythm: Normal rate and regular rhythm.     Pulses: Normal pulses.     Heart sounds: Normal heart sounds. No murmur heard. Pulmonary:     Effort: Pulmonary effort is normal. No respiratory distress.     Breath sounds: Wheezing present.  Abdominal:     Palpations: Abdomen is soft.     Tenderness: There is  no abdominal tenderness.  Musculoskeletal:        General: No swelling.     Cervical back: Normal range of motion and neck supple.     Right lower leg: No edema.     Left lower leg: No edema.  Skin:    General: Skin is warm and dry.     Capillary Refill: Capillary refill takes less than 2 seconds.  Neurological:     General: No focal deficit present.     Mental Status: She is alert and oriented to person, place, and time.     Cranial Nerves: No cranial nerve deficit.     Sensory: No sensory deficit.     Motor: No weakness.     Coordination: Coordination normal.  Psychiatric:        Mood and Affect: Mood normal.     (all labs ordered are listed, but only abnormal results are displayed) Labs Reviewed - No data to display  EKG: None  Radiology: DG Chest  2 View Result Date: 06/01/2024 CLINICAL DATA:  Asthma exacerbation, hypoxia EXAM: CHEST - 2 VIEW COMPARISON:  05/16/2023 FINDINGS: The heart size and mediastinal contours are within normal limits. Both lungs are clear. The visualized skeletal structures are unremarkable. IMPRESSION: No active cardiopulmonary disease. Electronically Signed   By: Ozell Daring M.D.   On: 06/01/2024 12:01     Procedures   Medications Ordered in the ED  ipratropium-albuterol  (DUONEB) 0.5-2.5 (3) MG/3ML nebulizer solution 3 mL (has no administration in time range)  predniSONE  (DELTASONE ) tablet 60 mg (has no administration in time range)                                    Medical Decision Making Amount and/or Complexity of Data Reviewed Radiology: ordered.  Risk Prescription drug management.   Lori Black is here with wheezing asthma exacerbation.  Unremarkable vitals.  No fever.  Ultimately she has got some wheezing on exam.  Dry cough.  She is not having any chest pain no shortness of breath.  No recent surgery or travel.  Heart rate in triage look to be at 100 but on my evaluation is in the 90s.  She really does not have any PE risk factors.  No recent surgery travel.  She is not immunocompromise.  Clinically she looks very well.  She is given breathing treatment and prednisone .  Had chest x-ray done that showed no evidence of pneumonia.  Is not having any chest pain.  No cardiac risk factors.  I doubt ACS PE.  I thought of these things but I think the most likely issue is reactive airway process in the setting of not having her maintenance steroid inhaler.  She is working with her primary care doctor to maybe come up with a more affordable option.  Ultimately we will put her on a prednisone  burst.  But I did tell her if she developed worsening symptoms or felt like steroids were not helping that she should come back for evaluation week to look for other etiologies of her symptoms but at this time I do think  that this is a reactive airway process given the wheezing on exam.  Discharge in good condition.  Understands return precautions.  This chart was dictated using voice recognition software.  Despite best efforts to proofread,  errors can occur which can change the documentation meaning.      Final diagnoses:  Mild asthma with exacerbation, unspecified whether persistent    ED Discharge Orders          Ordered    predniSONE  (DELTASONE ) 20 MG tablet  Daily        06/01/24 1519               Ruthe Cornet, DO 06/01/24 1523

## 2024-06-01 NOTE — Discharge Instructions (Addendum)
 Take next dose of prednisone  tomorrow.  Return if symptoms worsen.  If you feel like symptoms are not getting better on steroids please return for further evaluation.

## 2024-06-01 NOTE — ED Notes (Signed)
 Pt states more comfortable sitting.

## 2024-06-04 ENCOUNTER — Telehealth: Payer: Self-pay

## 2024-06-04 NOTE — Telephone Encounter (Signed)
 Affordable alternatives for Breztri  please - Pt does not have insurance

## 2024-06-06 ENCOUNTER — Other Ambulatory Visit: Payer: Self-pay

## 2024-06-06 MED ORDER — BREZTRI AEROSPHERE 160-9-4.8 MCG/ACT IN AERO: INHALATION_SPRAY | RESPIRATORY_TRACT | Status: DC

## 2024-06-20 NOTE — Progress Notes (Unsigned)
 Subjective:   Patient ID: Lori Black, female   DOB: 02/26/1986    MRN: [id number]  Brief patient profile:  37  yowf never smoker asthma since childhood never free of symptoms or need for inhaler and frequent prednisone  rx x every few months maint by Lori Black doctor on symbicort  fall 2017 with no change pattern > referred to pulmonary clinic 03/17/2017 by EDP p 3rd ER trip in 6 months with one admit for asthma        History of Present Illness  03/17/2017 1st [place] [facility] office visit/ Lori Black   Chief Complaint  Patient presents with   Pulmonary Consult    Self referral. Pt states that she was dxed with Asthma at birth. She recently moved here from [place]. She has been out of her Symbicort  for the past month. She has been having increased SOB and cough with thick, yellow sputum recently.  She has been using albuterol  neb and albuterol  inhaler at least once per day.   just took last prednisone  on day of ov Out of symb x sev weeks   But when on symb and pred only needs neb once a day  Has assoc nasal congestions and rattling cough worse at hs and in AM Baseline hfa very poor - see hfa  rec  Plan A = Automatic = symbicort  160 Take 2 puffs first thing in am and then another 2 puffs about 12 hours later.  Work on inhaler technique:   Plan B = Backup Only use your albuterol  as a rescue medication    Make appt to see Dr Lori Black and here prn >> did not returned to [facility]  08/02/2022  f/u ov/Lori Black re: asthma/allergy     maint on nothing x sev weeks   Chief Complaint  Patient presents with   Follow-up    Pt states she had some SOB and chest pain 3 days ago. Pt went to ER but was not seen because she had to get her kids  Dyspnea:  much better until ran out of symbicort  due to cost  Cough: harsh, mostly dry with cp generalized with coughing fits  Sleeping: waking up needing saba now SABA use: way too much since off symbicort   02: none Rec Plan A = Automatic = Always=    Symbicort  160 (Breztri ) Take 2 puffs  first thing in am and then another 2 puffs about 12 hours later.   Plan B = Backup (to supplement plan A, not to replace it) Only use your albuterol  inhaler as a rescue medication Plan C = Crisis (instead of Plan B but only if Plan B stops working) - only use your albuterol  nebulizer if you first try Plan B and it fails to help  Plan D = Deltasone  = Prednisone  10 mg 2 daily until better then 1 daily x 5 days and stop    03/15/2023  ACUTE  ov/Lori Black re: allergy /asthma maint on breztri  x 2 weeks   Dyspnea:  can't vacuum for more than a few min  Cough: harsh 24/7  Sleeping: sitting upright   SABA use: every few days  Rec [employer] [phone] they are the disability experts  Ok to use nebulizer up to 3 every 4 hours as needed  Ok to use Breztri  up to 1- 2puffs every 12 hours Work on inhaler technique:   >>>  Remember how golfers warm up by taking practice swings - do this with an empty inhaler  We will refer you to the [facility] wellness [facility]  and see you here as needed and you see the social worker there for advice [phone]    06/21/2024  f/u ov/Lori Black re: allergy /asthma   maint on breztri    Chief Complaint  Patient presents with   Asthma    Asthma follow up  Dyspnea:  no change short walks 5 min around neighborhood  Cough: minimal mucoid   Sleeping: bed is flat and a bunch of pillows  resp cc  SABA use: 1-2  per week / hfa /  02: none     No obvious day to day or daytime variability or assoc excess/ purulent sputum or mucus plugs or hemoptysis or cp or chest tightness, subjective wheeze or overt   hb symptoms.    Also denies any obvious fluctuation of symptoms with weather or environmental changes or other aggravating or alleviating factors except as outlined above   No unusual exposure hx or h/o childhood pna or knowledge of premature birth.  Current Allergies, Complete Past Medical History, Past Surgical History, Family History, and Social History were reviewed in [employer] record.  ROS  The following are not active complaints unless bolded Hoarseness, sore throat, dysphagia, dental problems, itching, sneezing,  nasal congestion or discharge of excess mucus or purulent secretions, ear ache,   fever, chills, sweats, unintended wt loss or wt gain, classically pleuritic or exertional cp,  orthopnea pnd or arm/hand swelling  or leg swelling, presyncope, palpitations, abdominal pain, anorexia, nausea, vomiting, diarrhea  or change in bowel habits or change in bladder habits, change in stools or change in urine, dysuria, hematuria,  rash, arthralgias, visual complaints, headache, numbness, weakness or ataxia or problems with walking or coordination,  change in mood or  memory.        Current Meds  Medication Sig   albuterol  (PROAIR  HFA) 108 (90 Base) MCG/ACT inhaler 2 puffs every 4 hours as needed only  if your can't catch your breath   albuterol  (PROVENTIL ) (2.5 MG/3ML) 0.083% nebulizer solution Take 3 mLs (2.5 mg total) by nebulization every 4 (four) hours as needed for wheezing or shortness of breath.   budesonide -glycopyrrolate-formoterol  (BREZTRI  AEROSPHERE) 160-9-4.8 MCG/ACT AERO inhaler Inhale 2 puffs into the lungs in the morning and at bedtime.        diphenhydrAMINE  (BENADRYL ) 50 MG tablet Take 50 mg by mouth.   ibuprofen  (ADVIL ) 800 MG tablet 800 mg.        promethazine  (PHENERGAN ) 25 MG tablet 1 TAB PO WITH FULL GLASS OF WATER WITH FULL GLASS OF WATER          Objective:   Physical Exam   Wts  06/21/2024           147  02/27/2023         144  08/02/2022       144 12/22/2021           152   06/28/2021         147  07/14/2020         150  11/21/2019           153  05/07/2019         152 02/06/2019        145  11/06/2018        148  07/30/2018      143  04/17/2018          145  01/16/2018          150  04/17/2017  150    03/17/17 150 lb (68 kg)  03/07/17 150 lb 4 oz (68.2 kg)  10/09/16 162 lb 1.6 oz (73.5 kg)     Vital signs  reviewed  06/21/2024  - Note at rest 02 sats  97% on RA   General appearance:    amb wm nad     HEENT : Oropharynx  clear      Nasal turbinates mod edema/ mild MP discharge/ ears ok bilaterally with good light reflexes    NECK :  without  apparent JVD/ palpable Nodes/TM    LUNGS: no acc muscle use,  Nl contour chest which is clear to A and P bilaterally without cough on insp or exp maneuvers   CV:  RRR  no s3 or murmur or increase in P2, and no edema   ABD:  soft and nontender   MS:  Gait nl   ext warm without deformities Or obvious joint restrictions  calf tenderness, cyanosis or clubbing    SKIN: warm and dry without lesions    NEURO:  alert, approp, nl sensorium with  no motor or cerebellar deficits apparent.         Assessment:       Assessment & Plan Rhinitis, chronic Onset in childhood - Allergy  profile 04/17/2018 >  Eos 0.2 /  IgE  128 RAST pos cat > dog, dust and oak trees No response to singulair  as of 06/2018 so d/c'd - Sinus CT  08/07/2018 1. Mild patchy mucosal thickening. 2. Prominent bilateral concha bullosa.> referred to ent as no cat/dog and symptoms are year round /worse outside and not suggestive of an allergic mech   - referred to ent>>>  [name] eval 11/20/18 rec flonase/ns irrigation and allegra > helps some  - 11/21/2019 rec trial of dymista  one bid > could not afford it  - 05/03/21 Sinus CT : Poor dentition with multiple carious teeth and multifocal periapical lucencies, as described.Mild-to-moderate mucosal thickening within the inferior maxillary sinuses, bilaterally - seen by [facility]  07/22/21 > consider allergy  shots - referred to [facility]  06/21/2024 >>>     Suspect significant chronic sinusitis related to dental issue and allergies with AM HA typical of sinus oseal obst so refer to [facility] and f/u with allergy  as well  In meantime can try to open nasal passages with Depomedrol 120 mg IM and mucinex dm prn    Chronic asthma, mild persistent,  uncomplicated Onset in childhood FENO 03/17/2017  =   30 on final day of prednisone  - Spirometry 03/17/2017  wnl s curvature on last day of pred p saba 5 h prior  - 03/17/2017   Start symb 160 2bid - 01/16/2018  After extensive coaching inhaler device  effectiveness =    75% (short Ti)   - FENO 01/16/2018  =   62 on symb 160 2bid  - Spirometry 01/16/2018  wnl with  Min curvature  - 01/16/2018 added singulair  - Allergy  profile 04/17/2018 >  Eos 0.2 /  IgE  128 RAST pos cat > dog, dust and oak trees - FENO 04/17/2018  =  30 p  symb 160/ singulair   - 06/2018 did not refill singulair  as no better   - allergy  eval Padget  07/22/21 > consider allergy  shots - 12/22/2021  After extensive coaching inhaler device,  effectiveness =    90% so added pred as plan D  - 08/02/2022  After extensive coaching inhaler device,  effectiveness =    90%  and restarted plan D  20 until better, 10 x 5 days and stop  - 03/16/2023  After extensive coaching inhaler device,  effectiveness =    75% (short Ti/ short breath hold) rec breztri  1-2 bid and referred to [facility]   As long as on breztri  > All goals of chronic asthma control met including optimal function and elimination of symptoms with minimal need for rescue therapy.  Contingencies discussed in full including contacting this office immediately if not controlling the symptoms using the rule of two's.     F/u can be   q 6 m, sooner prn  Each maintenance medication was reviewed in detail including emphasizing most importantly the difference between maintenance and prns and under what circumstances the prns are to be triggered using an action plan format where appropriate.  Total time for H and P, chart review, counseling, reviewing hfa  device(s) and generating customized AVS unique to this office visit / same day charting = 32 min          AVS  Patient Instructions  For nasal congestion use Mucinex D twice daily as needed (behind the counter)   Depomedrol 120 mg IM    My office will be contacting you by phone for referral to [facility] - if you don't hear back from my office within one week please call us  back or notify us  thru MyChart and we'll address it right away.   Please schedule a follow up visit in 6 months but call sooner if needed - see NP    Ozell America, MD 06/21/2024

## 2024-06-21 ENCOUNTER — Encounter: Payer: Self-pay | Admitting: Internal Medicine

## 2024-06-21 ENCOUNTER — Ambulatory Visit: Admitting: Internal Medicine

## 2024-06-21 VITALS — BP 124/81 | HR 93 | Temp 98.2°F | Ht 65.0 in | Wt 147.8 lb

## 2024-06-21 DIAGNOSIS — J310 Chronic rhinitis: Secondary | ICD-10-CM | POA: Diagnosis not present

## 2024-06-21 DIAGNOSIS — J4530 Mild persistent asthma, uncomplicated: Secondary | ICD-10-CM

## 2024-06-21 MED ORDER — METHYLPREDNISOLONE ACETATE 80 MG/ML IJ SUSP
120.0000 mg | Freq: Once | INTRAMUSCULAR | Status: AC
  Administered 2024-06-21: 120 mg via INTRAMUSCULAR

## 2024-06-21 MED ORDER — BREZTRI AEROSPHERE 160-9-4.8 MCG/ACT IN AERO: 2.0000 | INHALATION_SPRAY | Freq: Two times a day (BID) | RESPIRATORY_TRACT | 3 refills | Status: AC

## 2024-06-21 NOTE — Assessment & Plan Note (Addendum)
 Onset in childhood FENO 03/17/2017  =   30 on final day of prednisone  - Spirometry 03/17/2017  wnl s curvature on last day of pred p saba 5 h prior  - 03/17/2017   Start symb 160 2bid - 01/16/2018  After extensive coaching inhaler device  effectiveness =    75% (short Ti)   - FENO 01/16/2018  =   62 on symb 160 2bid  - Spirometry 01/16/2018  wnl with  Min curvature  - 01/16/2018 added singulair  - Allergy  profile 04/17/2018 >  Eos 0.2 /  IgE  128 RAST pos cat > dog, dust and oak trees - FENO 04/17/2018  =  30 p  symb 160/ singulair   - 06/2018 did not refill singulair  as no better   - allergy  eval Padget  07/22/21 > consider allergy  shots - 12/22/2021  After extensive coaching inhaler device,  effectiveness =    90% so added pred as plan D  - 08/02/2022  After extensive coaching inhaler device,  effectiveness =    90% and restarted plan D  20 until better, 10 x 5 days and stop  - 03/16/2023  After extensive coaching inhaler device,  effectiveness =    75% (short Ti/ short breath hold) rec breztri  1-2 bid and referred to [facility]   As long as on breztri  > All goals of chronic asthma control met including optimal function and elimination of symptoms with minimal need for rescue therapy.  Contingencies discussed in full including contacting this office immediately if not controlling the symptoms using the rule of two's.     F/u can be   q 6 m, sooner prn  Each maintenance medication was reviewed in detail including emphasizing most importantly the difference between maintenance and prns and under what circumstances the prns are to be triggered using an action plan format where appropriate.  Total time for H and P, chart review, counseling, reviewing hfa  device(s) and generating customized AVS unique to this office visit / same day charting = 32 min

## 2024-06-21 NOTE — Addendum Note (Signed)
 Addended by: MELVENIA POSEY R on: 06/21/2024 01:18 PM   Modules accepted: Orders

## 2024-06-21 NOTE — Patient Instructions (Addendum)
 For nasal congestion use Mucinex D twice daily as needed (behind the counter)   Depomedrol 120 mg IM   My office will be contacting you by phone for referral to [facility] - if you don't hear back from my office within one week please call us  back or notify us  thru MyChart and we'll address it right away.   Please schedule a follow up visit in 6 months but call sooner if needed - see NP

## 2024-06-21 NOTE — Assessment & Plan Note (Addendum)
 Onset in childhood - Allergy  profile 04/17/2018 >  Eos 0.2 /  IgE  128 RAST pos cat > dog, dust and oak trees No response to singulair  as of 06/2018 so d/c'd - Sinus CT  08/07/2018 1. Mild patchy mucosal thickening. 2. Prominent bilateral concha bullosa.> referred to ent as no cat/dog and symptoms are year round /worse outside and not suggestive of an allergic mech   - referred to ent>>>  [name] eval 11/20/18 rec flonase/ns irrigation and allegra > helps some  - 11/21/2019 rec trial of dymista  one bid > could not afford it  - 05/03/21 Sinus CT : Poor dentition with multiple carious teeth and multifocal periapical lucencies, as described.Mild-to-moderate mucosal thickening within the inferior maxillary sinuses, bilaterally - seen by [facility]  07/22/21 > consider allergy  shots - referred to [facility]  06/21/2024 >>>     Suspect significant chronic sinusitis related to dental issue and allergies with AM HA typical of sinus oseal obst so refer to [facility] and f/u with allergy  as well  In meantime can try to open nasal passages with Depomedrol 120 mg IM and mucinex dm prn

## 2024-06-24 ENCOUNTER — Encounter (INDEPENDENT_AMBULATORY_CARE_PROVIDER_SITE_OTHER): Payer: Self-pay

## 2024-07-31 ENCOUNTER — Telehealth (INDEPENDENT_AMBULATORY_CARE_PROVIDER_SITE_OTHER): Payer: Self-pay

## 2024-07-31 ENCOUNTER — Ambulatory Visit (INDEPENDENT_AMBULATORY_CARE_PROVIDER_SITE_OTHER)

## 2024-07-31 ENCOUNTER — Encounter (INDEPENDENT_AMBULATORY_CARE_PROVIDER_SITE_OTHER): Payer: Self-pay

## 2024-07-31 VITALS — BP 117/73 | HR 86 | Ht 65.5 in | Wt 150.0 lb

## 2024-07-31 DIAGNOSIS — J324 Chronic pansinusitis: Secondary | ICD-10-CM | POA: Diagnosis not present

## 2024-07-31 DIAGNOSIS — J323 Chronic sphenoidal sinusitis: Secondary | ICD-10-CM | POA: Diagnosis not present

## 2024-07-31 DIAGNOSIS — J322 Chronic ethmoidal sinusitis: Secondary | ICD-10-CM | POA: Diagnosis not present

## 2024-07-31 DIAGNOSIS — J3489 Other specified disorders of nose and nasal sinuses: Secondary | ICD-10-CM

## 2024-07-31 DIAGNOSIS — J343 Hypertrophy of nasal turbinates: Secondary | ICD-10-CM | POA: Diagnosis not present

## 2024-07-31 DIAGNOSIS — J320 Chronic maxillary sinusitis: Secondary | ICD-10-CM | POA: Diagnosis not present

## 2024-07-31 MED ORDER — AMOXICILLIN-POT CLAVULANATE 875-125 MG PO TABS: 1.0000 | ORAL_TABLET | Freq: Two times a day (BID) | ORAL | 0 refills | Status: AC

## 2024-07-31 MED ORDER — PREDNISONE 10 MG PO TABS: ORAL_TABLET | ORAL | 0 refills | Status: DC

## 2024-07-31 MED ORDER — PREDNISONE 10 MG PO TABS: ORAL_TABLET | ORAL | 0 refills | Status: AC

## 2024-07-31 NOTE — Telephone Encounter (Signed)
 A'Nayla LVM from [facility] pharmacy that they need clear SIG directions on pt's Rx for Prednisone . Please call them back.

## 2024-07-31 NOTE — Progress Notes (Signed)
 Dear Dr. Darlean, Here is my assessment for our mutual patient, Lori Black. Thank you for allowing me the opportunity to care for your patient. Please do not hesitate to contact me should you have any other questions. Sincerely, Dr. Hadassah Parody  Otolaryngology Clinic Note Referring provider: Dr. Darlean HPI:   Initial HPI (07/31/2024) Discussed the use of AI scribe software for clinical note transcription with the patient, who gave verbal consent to proceed.  History of Present Illness Lori Black is a 38 year old female with asthma who presents with chronic sinus issues.   Chronic sinonasal symptoms - Frequent sinus infections, with the most recent episode requiring antibiotics approximately 2-3 months ago - Symptoms include nasal obstruction and rhinorrhea  - Significant postnasal drainage - Difficulty breathing through the nose most days - Pressure and pain localized to the sinuses - Prednisone  provides symptomatic relief for sinus complaints  Headache and migraine symptoms - Severe headaches and migraines  - Onset of migraines began last month - Migraines have necessitated urgent care visits for treatment  Epistaxis and medication response - Use of various nasal sprays, including Flonase, which has caused epistaxis in the past  - No current use of nasal saline sprays or rinses  Otalgia and oropharyngeal discomfort - Left ear pain described as a 'scraping' sensation, especially when yawning - Otalgia sometimes radiates to the throat, causing odynophagia - No history of ear infections on prior evaluations   Independent Review of Additional Tests or Records:  Referral note 06/21/24 Ozell Darlean MD: referred to ENT in 2020 for sinus CT findings: [name] at time recommendd flonase/ns irrigation, allegra   CT sinus 05/03/21: independently reviewed and shows b/l inferior turbinate hypertrophy B/l concha bullosa, bilateral OMC obstruction, b/l max sinus fluid levels, b/l  ethmoid and sphenoid sinus thickening    PMH/Meds/All/SocHx/FamHx/ROS:   Past Medical History:  Diagnosis Date   Asthma      Past Surgical History:  Procedure Laterality Date   CESAREAN SECTION     KNEE ARTHROSCOPY Bilateral 04/30/2021   Procedure: ARTHROSCOPY KNEE BILATERAL;  Surgeon: Celena Ozell, MD;  Location: [facility] OR;  Service: Orthopedics;  Laterality: Bilateral;   WRIST ARTHROSCOPY WITH DEBRIDEMENT Right 07/16/2020   Procedure: WRIST ARTHROSCOPY WITH DEBRIDEMENT TRIANGULAR FIBRO CARTILAGE REPAIR SOFT TISSUE WITH EXTENSOR CARPAL RADIALIS STABILIZATION ;  Surgeon: Murrell Drivers, MD;  Location: [facility];  Service: Orthopedics;  Laterality: Right;   WRIST ARTHROSCOPY WITH FOVEAL TRIANGULAR FIBROCARTILAGE COMPLEX REPAIR Right 07/16/2020   Procedure: WRIST ARTHROSCOPY WITH FOVEAL TRIANGULAR FIBROCARTILAGE COMPLEX REPAIR;  Surgeon: Murrell Drivers, MD;  Location:  SURGERY CENTER;  Service: Orthopedics;  Laterality: Right;    Family History  Problem Relation Age of Onset   Diabetes Mother      Social Connections: Not on file     Current Outpatient Medications  Medication Instructions   albuterol  (PROAIR  HFA) 108 (90 Base) MCG/ACT inhaler 2 puffs every 4 hours as needed only  if your can't catch your breath   albuterol  (PROVENTIL ) 2.5 mg, Nebulization, Every 4 hours PRN   amoxicillin-clavulanate (AUGMENTIN) 875-125 MG tablet 1 tablet, Oral, 2 times daily   budesonide -glycopyrrolate-formoterol  (BREZTRI  AEROSPHERE) 160-9-4.8 MCG/ACT AERO inhaler 2 puffs, Inhalation, 2 times daily   budesonide -glycopyrrolate-formoterol  (BREZTRI  AEROSPHERE) 160-9-4.8 MCG/ACT AERO inhaler 2 puffs, Inhalation, 2 times daily   diphenhydrAMINE  (BENADRYL ) 50 mg   ibuprofen  (ADVIL ) 800 mg   Phenylephrine -APAP-guaiFENesin (TYLENOL  SINUS SEVERE) 5-325-200 MG TABS 1 tablet, Daily PRN   potassium chloride  (KLOR-CON ) 10 MEQ tablet  10 mEq, Oral, 2 times daily   predniSONE  (DELTASONE ) 10 MG  tablet Take 4 tablets (40 mg total) by mouth daily with breakfast for 1 day, THEN 3 tablets (30 mg total) daily with breakfast for 2 days, THEN 2 tablets (20 mg total) daily with breakfast for 2 days, THEN 1 tablet (10 mg total) daily with breakfast for 2 days.   promethazine  (PHENERGAN ) 25 MG tablet 1 TAB PO WITH FULL GLASS OF WATER WITH FULL GLASS OF WATER   pseudoephedrine  (SUDAFED) 30 mg, Oral, Every 4 hours PRN     Physical Exam:   BP 117/73 (BP Location: Left Arm, Patient Position: Sitting)   Pulse 86   Ht 5' 5.5 (1.664 m)   Wt 150 lb (68 kg)   SpO2 98%   BMI 24.58 kg/m   Salient findings:  CN II-XII intact  Bilateral EAC clear and TM intact with well pneumatized middle ear spaces Anterior rhinoscopy: Septum midline; bilateral inferior turbinates with hypertrophy No lesions of oral cavity/oropharynx No obviously palpable neck masses/lymphadenopathy/thyromegaly No respiratory distress or stridor  Seprately Identifiable Procedures:  Prior to initiating any procedures, risks/benefits/alternatives were explained to the patient and verbal consent obtained.  PROCEDURE (07/31/2024): Bilateral Diagnostic Rigid Nasal Endoscopy Pre-procedure diagnosis: Concern for chronic sinusitis, nasal obstruction Post-procedure diagnosis: same Indication: See pre-procedure diagnosis and physical exam above Complications: None apparent EBL: 0 mL Anesthesia: Lidocaine  4% and topical decongestant was topically sprayed in each nasal cavity  Description of Procedure:  Patient was identified. A rigid 30 degree endoscope was utilized to evaluate the sinonasal cavities, mucosa, sinus ostia and turbinates and septum.  Overall, signs of mucosal inflammation are noted.  Also noted are large middle turbinate bilaterally.  No mucopurulence, polyps, or masses noted.   Right Middle meatus: unable to fully visualize Right SE Recess: clear Left MM: unable to fully visualize Left SE Recess:  clear Photodocumentation was obtained.  CPT CODE -- 68768 - Mod 25   Impression & Plans:  Lori Black is a 38 y.o. female with   1. Chronic pansinusitis   2. Concha bullosa   3. Hypertrophy of both inferior nasal turbinates   4. Chronic sinusitis of both maxillary sinuses   5. Chronic ethmoidal sinusitis   6. Chronic sphenoidal sinusitis     Assessment and Plan Assessment & Plan Chronic pansinusitis with bilateral concha bullosa and nasal obstruction Chronic rhinosinusitis with bilateral concha bullosa causing nasal obstruction, congestion, drainage, and facial pressure. CT scan shows obstruction of maxillary sinuses, ethmoid and sphenoid sinuses. No active infection or nasal polyps on scope today. We did discuss that her facial pain may be due to migraine instead of her sinuses. Surgery considered to improve nasal breathing and drainage. - Plan for sinus surgery to address concha bullosa, inferior turbinate reduction and bilateral fess  - Asthma is mild and well controlled  - Prescribe preoperative course of oral steroids and antibiotics to be started one week before surgery. - Order updated sinus CT scan for preoperative planning and navigation. - Provided her with printed CT scan images and explanation for discussion with family.  We discussed the goals of sinus surgery, and expectations for postoperative management. We discussed R/B/A including pain, infection, bleeding (~5% risk of operative visit for control), persistent symptoms, need for revision surgery, and other risks including damage to the eye or skullbase, anesthetic complications, among others.  Patient understands and is ready to proceed.  See below regarding exact medications prescribed this encounter including dosages and route: Meds  ordered this encounter  Medications   amoxicillin-clavulanate (AUGMENTIN) 875-125 MG tablet    Sig: Take 1 tablet by mouth 2 (two) times daily for 7 days.    Dispense:  14 tablet     Refill:  0   DISCONTD: predniSONE  (DELTASONE ) 10 MG tablet    Sig: Take 4 tablets (40 mg total) by mouth daily with breakfast for 1 day, THEN 3 tablets (30 mg total) daily with breakfast for 1 day, THEN 2 tablets (20 mg total) daily with breakfast for 1 day, THEN 2 tablets (20 mg total) daily with breakfast for 2 days, THEN 1 tablet (10 mg total) daily with breakfast for 2 days.    Dispense:  15 tablet    Refill:  0   predniSONE  (DELTASONE ) 10 MG tablet    Sig: Take 4 tablets (40 mg total) by mouth daily with breakfast for 1 day, THEN 3 tablets (30 mg total) daily with breakfast for 2 days, THEN 2 tablets (20 mg total) daily with breakfast for 2 days, THEN 1 tablet (10 mg total) daily with breakfast for 2 days.    Dispense:  16 tablet    Refill:  0      Thank you for allowing me the opportunity to care for your patient. Please do not hesitate to contact me should you have any other questions.  Sincerely, Hadassah Parody, MD Otolaryngologist (ENT), [facility] Phone: [phone] Fax: [phone]

## 2024-07-31 NOTE — Patient Instructions (Addendum)
   Lori Black has evidence of chronic sinus inflammation and prior infections. She has structures in the midline of her nose that block her breathing and also block the drainage pathways of her sinuses. I have recommended that we go to surgery to remove the blockages and open up her sinuses so that they can drain better. The goal of surgery will be to help her ability to breathe through her nose and also help prevent infections and facial pain.   I am going to prescribe augmentin and prednisone . She should take both of those 1 week prior to the date of surgery.     PHONE NUMBER TO CALL FOR CT SCAN: I have ordered an imaging study for you to complete prior to your next visit. Please call [facility] Scheduling at [phone] to schedule your imaging if you have not received a call within 24 hours. If you are unable to complete your imaging study prior to your next scheduled visit please call our office to let us  know.

## 2024-08-07 ENCOUNTER — Ambulatory Visit (HOSPITAL_COMMUNITY)
Admission: RE | Admit: 2024-08-07 | Discharge: 2024-08-07 | Disposition: A | Discharge status: Home / Self Care | Source: Ambulatory Visit

## 2024-08-07 DIAGNOSIS — J3489 Other specified disorders of nose and nasal sinuses: Secondary | ICD-10-CM | POA: Diagnosis not present

## 2024-08-07 DIAGNOSIS — J343 Hypertrophy of nasal turbinates: Secondary | ICD-10-CM | POA: Diagnosis not present

## 2024-08-07 DIAGNOSIS — J324 Chronic pansinusitis: Secondary | ICD-10-CM | POA: Diagnosis present

## 2024-09-20 ENCOUNTER — Encounter (HOSPITAL_BASED_OUTPATIENT_CLINIC_OR_DEPARTMENT_OTHER): Payer: Self-pay

## 2024-09-20 ENCOUNTER — Other Ambulatory Visit: Payer: Self-pay

## 2024-09-27 ENCOUNTER — Ambulatory Visit (HOSPITAL_BASED_OUTPATIENT_CLINIC_OR_DEPARTMENT_OTHER): Admitting: Anesthesiology

## 2024-09-27 ENCOUNTER — Ambulatory Visit (HOSPITAL_BASED_OUTPATIENT_CLINIC_OR_DEPARTMENT_OTHER): Admission: RE | Admit: 2024-09-27 | Discharge: 2024-09-27 | Disposition: A

## 2024-09-27 ENCOUNTER — Other Ambulatory Visit: Payer: Self-pay

## 2024-09-27 ENCOUNTER — Encounter (HOSPITAL_BASED_OUTPATIENT_CLINIC_OR_DEPARTMENT_OTHER): Payer: Self-pay

## 2024-09-27 ENCOUNTER — Encounter (HOSPITAL_BASED_OUTPATIENT_CLINIC_OR_DEPARTMENT_OTHER): Admission: RE | Disposition: A | Payer: Self-pay | Source: Home / Self Care

## 2024-09-27 DIAGNOSIS — J45909 Unspecified asthma, uncomplicated: Secondary | ICD-10-CM | POA: Diagnosis not present

## 2024-09-27 DIAGNOSIS — J323 Chronic sphenoidal sinusitis: Secondary | ICD-10-CM | POA: Diagnosis not present

## 2024-09-27 DIAGNOSIS — Z01818 Encounter for other preprocedural examination: Secondary | ICD-10-CM

## 2024-09-27 DIAGNOSIS — J322 Chronic ethmoidal sinusitis: Secondary | ICD-10-CM | POA: Diagnosis not present

## 2024-09-27 DIAGNOSIS — J320 Chronic maxillary sinusitis: Secondary | ICD-10-CM | POA: Diagnosis present

## 2024-09-27 DIAGNOSIS — J342 Deviated nasal septum: Secondary | ICD-10-CM | POA: Diagnosis not present

## 2024-09-27 DIAGNOSIS — J328 Other chronic sinusitis: Secondary | ICD-10-CM | POA: Diagnosis not present

## 2024-09-27 DIAGNOSIS — J3489 Other specified disorders of nose and nasal sinuses: Secondary | ICD-10-CM | POA: Diagnosis not present

## 2024-09-27 DIAGNOSIS — J343 Hypertrophy of nasal turbinates: Secondary | ICD-10-CM | POA: Diagnosis not present

## 2024-09-27 HISTORY — PX: SINUS ENDO W/FUSION: SHX777

## 2024-09-27 HISTORY — PX: SINUS ENDO WITH FUSION: SHX5329

## 2024-09-27 LAB — POCT PREGNANCY, URINE: Preg Test, Ur: NEGATIVE

## 2024-09-27 SURGERY — SURGERY, PARANASAL SINUS, ENDOSCOPIC, WITH NASAL SEPTOPLASTY, TURBINOPLASTY, AND MAXILLARY SINUSOTOMY
Anesthesia: General | Site: Nose | Laterality: Bilateral

## 2024-09-27 MED ORDER — PROPOFOL 10 MG/ML IV BOLUS
INTRAVENOUS | Status: DC | PRN
  Administered 2024-09-27: 200 mg via INTRAVENOUS

## 2024-09-27 MED ORDER — FENTANYL CITRATE (PF) 100 MCG/2ML IJ SOLN
INTRAMUSCULAR | Status: AC
  Filled 2024-09-27: qty 2

## 2024-09-27 MED ORDER — OXYCODONE HCL 5 MG PO TABS
ORAL_TABLET | ORAL | Status: AC
  Filled 2024-09-27: qty 1

## 2024-09-27 MED ORDER — FLUORESCEIN SODIUM 1 MG OP STRP
ORAL_STRIP | OPHTHALMIC | Status: DC | PRN
  Administered 2024-09-27: 1

## 2024-09-27 MED ORDER — CEFAZOLIN SODIUM-DEXTROSE 2-3 GM-%(50ML) IV SOLR
INTRAVENOUS | Status: DC | PRN
  Administered 2024-09-27: 2 g via INTRAVENOUS

## 2024-09-27 MED ORDER — SODIUM CHLORIDE 0.9 % IV SOLN
INTRAVENOUS | Status: AC | PRN
  Administered 2024-09-27: 100 mL

## 2024-09-27 MED ORDER — OXYCODONE HCL 5 MG PO TABS
5.0000 mg | ORAL_TABLET | Freq: Once | ORAL | Status: AC
  Administered 2024-09-27: 5 mg via ORAL

## 2024-09-27 MED ORDER — FENTANYL CITRATE (PF) 100 MCG/2ML IJ SOLN: 25.0000 ug | INTRAMUSCULAR | Status: DC | PRN

## 2024-09-27 MED ORDER — TRANEXAMIC ACID-NACL 1000-0.7 MG/100ML-% IV SOLN
INTRAVENOUS | Status: DC | PRN
  Administered 2024-09-27: 1000 mg via INTRAVENOUS

## 2024-09-27 MED ORDER — DEXMEDETOMIDINE HCL IN NACL 80 MCG/20ML IV SOLN
INTRAVENOUS | Status: DC | PRN
  Administered 2024-09-27 (×2): 8 ug via INTRAVENOUS

## 2024-09-27 MED ORDER — ACETAMINOPHEN 10 MG/ML IV SOLN
1000.0000 mg | Freq: Four times a day (QID) | INTRAVENOUS | Status: DC
  Administered 2024-09-27: 1000 mg via INTRAVENOUS

## 2024-09-27 MED ORDER — DROPERIDOL 2.5 MG/ML IJ SOLN: 0.6250 mg | Freq: Once | INTRAMUSCULAR | Status: DC | PRN

## 2024-09-27 MED ORDER — 0.9 % SODIUM CHLORIDE (POUR BTL) OPTIME
TOPICAL | Status: DC | PRN
  Administered 2024-09-27: 350 mL

## 2024-09-27 MED ORDER — ACETAMINOPHEN 10 MG/ML IV SOLN
INTRAVENOUS | Status: AC
  Filled 2024-09-27: qty 100

## 2024-09-27 MED ORDER — SUCCINYLCHOLINE CHLORIDE 200 MG/10ML IV SOSY
PREFILLED_SYRINGE | INTRAVENOUS | Status: DC | PRN
  Administered 2024-09-27: 120 mg via INTRAVENOUS

## 2024-09-27 MED ORDER — EPINEPHRINE 0.1 % (1MG/ML) IJ FOR NASAL USE
INTRAMUSCULAR | Status: DC | PRN
  Administered 2024-09-27: 1 [drp] via NASAL

## 2024-09-27 MED ORDER — LIDOCAINE 2% (20 MG/ML) 5 ML SYRINGE
INTRAMUSCULAR | Status: DC | PRN
  Administered 2024-09-27: 60 mg via INTRAVENOUS

## 2024-09-27 MED ORDER — ACETAMINOPHEN 500 MG PO TABS: 1000.0000 mg | ORAL_TABLET | Freq: Once | ORAL | Status: DC

## 2024-09-27 MED ORDER — MIDAZOLAM HCL 5 MG/5ML IJ SOLN
INTRAMUSCULAR | Status: DC | PRN
  Administered 2024-09-27: 2 mg via INTRAVENOUS

## 2024-09-27 MED ORDER — MIDAZOLAM HCL 2 MG/2ML IJ SOLN
INTRAMUSCULAR | Status: AC
  Filled 2024-09-27: qty 2

## 2024-09-27 MED ORDER — HYDROCODONE-ACETAMINOPHEN 5-325 MG PO TABS: 1.0000 | ORAL_TABLET | Freq: Four times a day (QID) | ORAL | 0 refills | Status: DC | PRN

## 2024-09-27 MED ORDER — ONDANSETRON HCL 4 MG/2ML IJ SOLN
INTRAMUSCULAR | Status: DC | PRN
  Administered 2024-09-27: 4 mg via INTRAVENOUS

## 2024-09-27 MED ORDER — MUPIROCIN 2 % EX OINT
TOPICAL_OINTMENT | CUTANEOUS | Status: DC | PRN
  Administered 2024-09-27: 1 via NASAL

## 2024-09-27 MED ORDER — FENTANYL CITRATE (PF) 100 MCG/2ML IJ SOLN
INTRAMUSCULAR | Status: DC | PRN
  Administered 2024-09-27: 50 ug via INTRAVENOUS
  Administered 2024-09-27 (×2): 25 ug via INTRAVENOUS

## 2024-09-27 MED ORDER — ROCURONIUM BROMIDE 10 MG/ML (PF) SYRINGE
PREFILLED_SYRINGE | INTRAVENOUS | Status: DC | PRN
  Administered 2024-09-27 (×2): 30 mg via INTRAVENOUS

## 2024-09-27 MED ORDER — BUPIVACAINE LIPOSOME 1.3 % IJ SUSP
INTRAMUSCULAR | Status: AC
  Filled 2024-09-27: qty 10

## 2024-09-27 MED ORDER — SUGAMMADEX SODIUM 200 MG/2ML IV SOLN
INTRAVENOUS | Status: DC | PRN
  Administered 2024-09-27: 200 mg via INTRAVENOUS

## 2024-09-27 MED ORDER — LIDOCAINE-EPINEPHRINE 1 %-1:100000 IJ SOLN
INTRAMUSCULAR | Status: DC | PRN
  Administered 2024-09-27: 16 mL

## 2024-09-27 MED ORDER — DEXAMETHASONE SODIUM PHOSPHATE 4 MG/ML IJ SOLN
INTRAMUSCULAR | Status: DC | PRN
  Administered 2024-09-27: 10 mg via INTRAVENOUS

## 2024-09-27 MED ORDER — LACTATED RINGERS IV SOLN: INTRAVENOUS | Status: DC

## 2024-09-27 SURGICAL SUPPLY — 70 items
BALLOON FRONTAL NUVENT 6X17 (BALLOONS) IMPLANT
BALLOON FRONTAL NVNT 6X17 70D (BALLOONS) IMPLANT
BLADE INF TURB ROT M4 2 5PK (BLADE) IMPLANT
BLADE NAVIG QUADCUT 4.3X13 M4 (BLADE) ×1 IMPLANT
BLADE PED QUAD 3.4 EM (BLADE) IMPLANT
BLADE RAD60 ROTATE M4 4 5PK (BLADE) IMPLANT
BLADE ROTATE RAD 40 4 M4 (BLADE) IMPLANT
BLADE ROTATE TRICUT 4X13 M4 (BLADE) IMPLANT
BLADE SHAVER TURBINATE 11X2.9 (BLADE) ×1 IMPLANT
BLADE SURG 15 STRL LF DISP TIS (BLADE) IMPLANT
CANISTER SUC SOCK COL 7IN (MISCELLANEOUS) ×1 IMPLANT
CANISTER SUCT 1200ML W/VALVE (MISCELLANEOUS) ×2 IMPLANT
COAGULATOR SUCT 8FR VV (MISCELLANEOUS) IMPLANT
CORD BIPOLAR FORCEPS 12FT (ELECTRODE) IMPLANT
DEFOGGER MIRROR 1QT (MISCELLANEOUS) IMPLANT
DRESSING NASAL KENNEDY 3.5X.9 (MISCELLANEOUS) IMPLANT
DRSG NASOPORE 8CM (GAUZE/BANDAGES/DRESSINGS) IMPLANT
DRSG TELFA 3X8 NADH STRL (GAUZE/BANDAGES/DRESSINGS) IMPLANT
ELECT COATED BLADE 2.86 ST (ELECTRODE) IMPLANT
ELECTRODE REM PT RTRN 9FT ADLT (ELECTROSURGICAL) ×1 IMPLANT
FORCEPS BIPOLAR SPETZLER 8 1.0 (NEUROSURGERY SUPPLIES) IMPLANT
GAUZE SPONGE 2X2 STRL 8-PLY (GAUZE/BANDAGES/DRESSINGS) ×1 IMPLANT
GLOVE BIO SURGEON STRL SZ 6 (GLOVE) ×1 IMPLANT
GOWN STRL REUS W/ TWL LRG LVL3 (GOWN DISPOSABLE) ×2 IMPLANT
HEMOSTAT ARISTA ABSORB 3G PWDR (HEMOSTASIS) IMPLANT
HEMOSTAT SURGICEL .5X2 ABSORB (HEMOSTASIS) IMPLANT
HEMOSTAT SURGICEL 2X14 (HEMOSTASIS) IMPLANT
INFLATOR BALLOON W/TUBE (BALLOONS) IMPLANT
IV NS 500ML BAXH (IV SOLUTION) ×1 IMPLANT
NDL HYPO 25X1 1.5 SAFETY (NEEDLE) ×2 IMPLANT
NDL PRECISIONGLIDE 27X1.5 (NEEDLE) ×1 IMPLANT
NDL SAFETY ECLIPSE 18X1.5 (NEEDLE) IMPLANT
NDL SPNL 25GX3.5 QUINCKE BL (NEEDLE) ×1 IMPLANT
NS IRRIG 500ML POUR BTL (IV SOLUTION) ×1 IMPLANT
PACK BASIN DAY SURGERY FS (CUSTOM PROCEDURE TRAY) ×1 IMPLANT
PACK ENT DAY SURGERY (CUSTOM PROCEDURE TRAY) ×1 IMPLANT
PATTIES SURGICAL .5 X3 (DISPOSABLE) ×1 IMPLANT
PENCIL SMOKE EVACUATOR (MISCELLANEOUS) IMPLANT
SHEATH ENDOSCRUB 0 DEG (SHEATH) IMPLANT
SHEATH ENDOSCRUB 30 DEG (SHEATH) IMPLANT
SHEATH ENDOSCRUB 45 DEG (SHEATH) IMPLANT
SHEET MEDIUM DRAPE 40X70 STRL (DRAPES) IMPLANT
SLEEVE SCD COMPRESS KNEE MED (STOCKING) ×1 IMPLANT
SOLUTION ANTFG W/FOAM PAD STRL (MISCELLANEOUS) ×1 IMPLANT
SPIKE FLUID TRANSFER (MISCELLANEOUS) IMPLANT
SPLINT NASAL AIRWAY SILICONE (MISCELLANEOUS) ×1 IMPLANT
SPLINT NASAL POSISEP X .6X2 (GAUZE/BANDAGES/DRESSINGS) IMPLANT
SPONGE NEURO XRAY DETECT 1X3 (DISPOSABLE) IMPLANT
SUCTION TUBE FRAZIER 10FR DISP (SUCTIONS) ×1 IMPLANT
SUT CHROMIC 4 0 RB 1X27 (SUTURE) IMPLANT
SUT PLAIN 4 0 ~~LOC~~ 1 (SUTURE) IMPLANT
SUT PLAIN GUT FAST 5-0 (SUTURE) ×1 IMPLANT
SUT PROLENE 2 0 SH DA (SUTURE) IMPLANT
SUT PROLENE 3 0 PS 1 (SUTURE) IMPLANT
SUT PROLENE 3 0 PS 2 (SUTURE) ×1 IMPLANT
SUT VIC AB 4-0 RB1 27X BRD (SUTURE) IMPLANT
SUT VICRYL RAPIDE 4/0 PS 2 (SUTURE) IMPLANT
SWAB COLLECTION DEVICE MRSA (MISCELLANEOUS) IMPLANT
SWAB CULTURE ESWAB REG 1ML (MISCELLANEOUS) IMPLANT
SYR 10ML LL (SYRINGE) ×1 IMPLANT
SYR 3ML 18GX1 1/2 (SYRINGE) ×2 IMPLANT
SYR TB 1ML LL NO SAFETY (SYRINGE) ×1 IMPLANT
TOWEL GREEN STERILE FF (TOWEL DISPOSABLE) ×1 IMPLANT
TRACKER ENT INSTRUMENT (MISCELLANEOUS) ×1 IMPLANT
TRACKER ENT PATIENT (MISCELLANEOUS) ×1 IMPLANT
TUBE CONNECTING 20X1/4 (TUBING) ×1 IMPLANT
TUBE SALEM SUMP 12FR 48 (TUBING) IMPLANT
TUBE SALEM SUMP 16F (TUBING) ×1 IMPLANT
TUBING STRAIGHTSHOT EPS 5PK (TUBING) IMPLANT
YANKAUER SUCT BULB TIP NO VENT (SUCTIONS) ×1 IMPLANT

## 2024-09-27 NOTE — Discharge Instructions (Addendum)
 Surgery Discharge Instructions (Dr. Tobie)  1. Call your doctor or go to the emergency room if you have: - Fever of 101.5 degrees or higher - Severe pain that has increased greatly since your surgery or is uncontrolled by your current pain medications - Increasing or concerning amount of bleeding from your nose. You should expect some bloody drainage from your nose for 1-3 days. Even a 5-10 minute trickle nose bleed is expected after surgery - Nausea and vomiting that does not go away - Chest pain/shortness of breath - Any other acute events, problems, or concerns  2. Wound Care/Dressings/Drain Instructions:  - It is OK to shower following your surgery. - You have a small amount of dissolvable packing in your nose. The remainder of this may be removed in clinic, otherwise this does not need to come out. - You can begin using nasal saline irrigations once you no longer have bleeding from the nose. This is usually 1-2 days after your surgery. - You have splints in your nose so it might be difficult to do the irrigations until I take your splints out at follow-up. It is OK if not able to do the irrigations until the splints are removed.  -There are small soft plastic splints in your nose to hold your septum in the middle. These may crust a bit, which can be reduced by using nasal saline spray. We will take these out at your appointment next week    3. Follow Up:  - A follow up appointment will be scheduled for you with Dr. Greggory. If you do not know the date/time, please contact our office  4. Activity/Restrictions: - Resume your regular activities, as tolerated - Avoid heavy lifting, bending over, manipulating your nose, blowing your nose, sneezing with your mouth closed, straining and strenuous activities until instructed otherwise. Do not lift more than 10 lbs for 10 days  5. Diet: - Resume your regular diet, as tolerated  6. Medications - Use tylenol  650 mg every 6 hours. Additional  pain medication will be prescribed for you if you have severe pain that is not responsive to tylenol .   6. Additional Instructions: - You should complete the course of antibiotic/steroid prescribed to you before surgery - Use acetaminophen /Tylenol  and ibuprofen  to control your pain. If this does not bring you relief or the pain remains severe, a stronger pain medication (Tramadol - 50mg  tablet every 6 hours as needed) has been prescribed to you. - DO NOT MIX NARCOTIC PAIN MEDICATIONS OR TAKE NARCOTIC PRESCRIPTIONS AT THE SAME TIME (PERCODET, LORTAB, ROXICODONE , ETC.) - DO NOT DRIVE OR OPERATE HEAVY MACHINERY WHILE ON NARCOTICS - DO NOT TAKE MORE THAN 4 GRAMS (4000mg ) OF TYLENOL  (ACETAMINOPHEN ) IN 24 HOURS  ________________________________________________________________  Department of Otolaryngology Contact Info: Otolaryngology Nursing Triage (Monday-Friday daytime working hours or for emergencies after hours) [phone]   Post Anesthesia Home Care Instructions  Activity: Get plenty of rest for the remainder of the day. A responsible individual must stay with you for 24 hours following the procedure.  For the next 24 hours, DO NOT: -Drive a car -Advertising copywriter -Drink alcoholic beverages -Take any medication unless instructed by your physician -Make any legal decisions or sign important papers.  Meals: Start with liquid foods such as gelatin or soup. Progress to regular foods as tolerated. Avoid greasy, spicy, heavy foods. If nausea and/or vomiting occur, drink only clear liquids until the nausea and/or vomiting subsides. Call your physician if vomiting continues.  Special Instructions/Symptoms: Your throat may  feel dry or sore from the anesthesia or the breathing tube placed in your throat during surgery. If this causes discomfort, gargle with warm salt water. The discomfort should disappear within 24 hours.  If you had a scopolamine patch placed behind your ear for the  management of post- operative nausea and/or vomiting:  1. The medication in the patch is effective for 72 hours, after which it should be removed.  Wrap patch in a tissue and discard in the trash. Wash hands thoroughly with soap and water. 2. You may remove the patch earlier than 72 hours if you experience unpleasant side effects which may include dry mouth, dizziness or visual disturbances. 3. Avoid touching the patch. Wash your hands with soap and water after contact with the patch.    Post Anesthesia Home Care Instructions  Activity: Get plenty of rest for the remainder of the day. A responsible individual must stay with you for 24 hours following the procedure.  For the next 24 hours, DO NOT: -Drive a car -Advertising copywriter -Drink alcoholic beverages -Take any medication unless instructed by your physician -Make any legal decisions or sign important papers.  Meals: Start with liquid foods such as gelatin or soup. Progress to regular foods as tolerated. Avoid greasy, spicy, heavy foods. If nausea and/or vomiting occur, drink only clear liquids until the nausea and/or vomiting subsides. Call your physician if vomiting continues.  Special Instructions/Symptoms: Your throat may feel dry or sore from the anesthesia or the breathing tube placed in your throat during surgery. If this causes discomfort, gargle with warm salt water. The discomfort should disappear within 24 hours.

## 2024-09-27 NOTE — Transfer of Care (Signed)
 Immediate Anesthesia Transfer of Care Note  Patient: Lori Black  Procedure(s) Performed: SURGERY, PARANASAL SINUS, ENDOSCOPIC, WITH NASAL SEPTOPLASTY, TURBINOPLASTY, AND MAXILLARY SINUSOTOMY (Bilateral: Nose) SINUS SURGERY, ENDOSCOPIC, USING COMPUTER-ASSISTED NAVIGATION (Bilateral: Nose)  Patient Location: PACU  Anesthesia Type:General  Level of Consciousness: drowsy  Airway & Oxygen Therapy: Patient Spontanous Breathing and Patient connected to face mask oxygen  Post-op Assessment: Report given to RN and Post -op Vital signs reviewed and stable  Post vital signs: Reviewed and stable  Last Vitals:  Vitals Value Taken Time  BP 133/76 09/27/24 10:43  Temp    Pulse 96 09/27/24 10:44  Resp 13 09/27/24 10:44  SpO2 99 % 09/27/24 10:44  Vitals shown include unfiled device data.  Last Pain:  Vitals:   09/27/24 0638  TempSrc: Temporal  PainSc: 0-No pain      Patients Stated Pain Goal: 4 (09/27/24 9361)  Complications: No notable events documented.

## 2024-09-27 NOTE — Anesthesia Procedure Notes (Signed)
 Procedure Name: Intubation Date/Time: 09/27/2024 7:35 AM  Performed by: Claudene Delon SQUIBB, CRNAPre-anesthesia Checklist: Patient identified, Emergency Drugs available, Suction available and Patient being monitored Patient Re-evaluated:Patient Re-evaluated prior to induction Oxygen Delivery Method: Circle System Utilized Preoxygenation: Pre-oxygenation with 100% oxygen Induction Type: IV induction Ventilation: Mask ventilation without difficulty Laryngoscope Size: Miller and 2 Grade View: Grade I Tube type: Oral Tube size: 7.0 mm Number of attempts: 1 Airway Equipment and Method: Stylet Placement Confirmation: ETT inserted through vocal cords under direct vision, positive ETCO2 and breath sounds checked- equal and bilateral Secured at: 22 cm Tube secured with: Tape Dental Injury: Teeth and Oropharynx as per pre-operative assessment

## 2024-09-27 NOTE — Anesthesia Postprocedure Evaluation (Signed)
 Anesthesia Post Note  Patient: Lori Black  Procedure(s) Performed: SURGERY, PARANASAL SINUS, ENDOSCOPIC, WITH NASAL SEPTOPLASTY, TURBINOPLASTY, AND MAXILLARY SINUSOTOMY (Bilateral: Nose) SINUS SURGERY, ENDOSCOPIC, USING COMPUTER-ASSISTED NAVIGATION (Bilateral: Nose)     Patient location during evaluation: PACU Anesthesia Type: General Level of consciousness: awake and alert Pain management: pain level controlled Vital Signs Assessment: post-procedure vital signs reviewed and stable Respiratory status: spontaneous breathing, nonlabored ventilation, respiratory function stable and patient connected to nasal cannula oxygen Cardiovascular status: blood pressure returned to baseline and stable Postop Assessment: no apparent nausea or vomiting Anesthetic complications: no   No notable events documented.  Last Vitals:  Vitals:   09/27/24 1115 09/27/24 1142  BP: 130/75 132/81  Pulse: 84 90  Resp: (!) 9   Temp:  36.6 C  SpO2: 92% 96%    Last Pain:  Vitals:   09/27/24 1142  TempSrc: Temporal  PainSc: 6                  Despina Boan P Brynnan Rodenbaugh

## 2024-09-27 NOTE — H&P (Signed)
 Pre-Operative H&P - Day Of Surgery Patient Name: Lori Black Date:   09/27/2024  HPI: Lori Black is a 38 y.o. female who presents today for operative treatment of chronic sinusitis, nasal obstruction. Patient denies recent significant changes to health or significant new medications or physiologic change in condition which would immediately impact plans. No new types of therapy has been initiated that would change the plan or the appropriateness of the plan.   ROS:  A complete review of systems was obtained and is otherwise negative.   PMH:  Past Medical History:  Diagnosis Date   Asthma     PSH:  Past Surgical History:  Procedure Laterality Date   CESAREAN SECTION     KNEE ARTHROSCOPY Bilateral 04/30/2021   Procedure: ARTHROSCOPY KNEE BILATERAL;  Surgeon: Celena Sharper, MD;  Location: [facility] OR;  Service: Orthopedics;  Laterality: Bilateral;   WRIST ARTHROSCOPY WITH DEBRIDEMENT Right 07/16/2020   Procedure: WRIST ARTHROSCOPY WITH DEBRIDEMENT TRIANGULAR FIBRO CARTILAGE REPAIR SOFT TISSUE WITH EXTENSOR CARPAL RADIALIS STABILIZATION ;  Surgeon: Murrell Drivers, MD;  Location: [facility];  Service: Orthopedics;  Laterality: Right;   WRIST ARTHROSCOPY WITH FOVEAL TRIANGULAR FIBROCARTILAGE COMPLEX REPAIR Right 07/16/2020   Procedure: WRIST ARTHROSCOPY WITH FOVEAL TRIANGULAR FIBROCARTILAGE COMPLEX REPAIR;  Surgeon: Murrell Drivers, MD;  Location: [facility];  Service: Orthopedics;  Laterality: Right;    MEDS:  Current Medications[1]  ALLERGIES: Patient has no allergy  information on record.  EXAM: Vitals: BP 138/71   Pulse 90   Temp 98.1 F (36.7 C) (Temporal)   Resp 16   Ht 5' 5 (1.651 m)   Wt 65.8 kg   LMP 09/15/2024 (Exact Date)   SpO2 100%   BMI 24.14 kg/m   General Awake, at baseline alertness.   HEENT No scleral icterus or conjunctival hemorrhage. Globe position appears normal. External ears  normal. Nose patent without rhinorrhea.  Cardiovascular No  cyanosis.  Pulmonary No audible stridor. Breathing easily with no labor.  Neuro Symmetric facial movement.   Psychiatry Appropriate affect and mood.  Skin No scars or lesions on face or neck.  Extermities Moves all extremities with normal range of motion.   Other Findings None.   Assessment & Plan: Lori Black has diagnoses of chronic sinusitis, nasal obstruction and will go to the OR today for bilateral FESS, septoplasty, ITR, concha bullosa excision.    We discussed R/B/A including pain, infection, bleeding (~5% risk of operative visit for control), persistent symptoms, need for revision surgery, and other risks including damage to the eye and loss of vision, and injury to skull base with risk of CSF leak and additional intracranial complications, anesthetic complications, among others.  Informed consent was obtained and available in EMR today. All questions have been answered, and risks/benefits/alternatives of procedure as noted in the consent were discussed in a quiet area. Questions were invited and answered. The patient expressed understanding, provided consent and wished to proceed despite risks.  Lori Black 09/27/2024 7:15 AM     [1]  Current Facility-Administered Medications:    acetaminophen  (TYLENOL ) tablet 1,000 mg, 1,000 mg, Oral, Once, Stoltzfus, Cordella P, DO   lactated ringers  infusion, , Intravenous, Continuous, Ellender, Bernardino SQUIBB, MD

## 2024-09-27 NOTE — Anesthesia Preprocedure Evaluation (Addendum)
 Anesthesia Evaluation  Patient identified by MRN, date of birth, ID band Patient awake    Reviewed: Allergy  & Precautions, NPO status , Patient's Chart, lab work & pertinent test results  Airway Mallampati: II  TM Distance: >3 FB Neck ROM: Full    Dental no notable dental hx.    Pulmonary asthma    Pulmonary exam normal        Cardiovascular negative cardio ROS  Rhythm:Regular Rate:Normal     Neuro/Psych negative neurological ROS  negative psych ROS   GI/Hepatic negative GI ROS, Neg liver ROS,,,  Endo/Other  negative endocrine ROS    Renal/GU negative Renal ROS  negative genitourinary   Musculoskeletal  (+) Arthritis ,    Abdominal Normal abdominal exam  (+)   Peds  Hematology  (+) Blood dyscrasia, anemia Lab Results      Component                Value               Date                      WBC                      8.7                 08/12/2023                HGB                      7.9 (L)             08/12/2023                HCT                      26.6 (L)            08/12/2023                MCV                      68.0 (L)            08/12/2023                PLT                      290                 08/12/2023              Anesthesia Other Findings Chronic sinusitis   Reproductive/Obstetrics                              Anesthesia Physical Anesthesia Plan  ASA: 2  Anesthesia Plan: General   Post-op Pain Management:    Induction: Intravenous  PONV Risk Score and Plan: 3 and Ondansetron , Dexamethasone , Midazolam  and Treatment may vary due to age or medical condition  Airway Management Planned: Mask and Oral ETT  Additional Equipment: None  Intra-op Plan:   Post-operative Plan: Extubation in OR  Informed Consent: I have reviewed the patients History and Physical, chart, labs and discussed the procedure including the risks, benefits and alternatives for the  proposed anesthesia with the patient or authorized representative who has indicated  his/her understanding and acceptance.     Dental advisory given  Plan Discussed with:   Anesthesia Plan Comments:          Anesthesia Quick Evaluation

## 2024-09-28 ENCOUNTER — Encounter (HOSPITAL_BASED_OUTPATIENT_CLINIC_OR_DEPARTMENT_OTHER): Payer: Self-pay

## 2024-09-30 LAB — SURGICAL PATHOLOGY

## 2024-09-30 NOTE — Brief Op Note (Signed)
 09/27/2024  8:38 AM  PATIENT:  Lori Black  38 y.o. female  PRE-OPERATIVE DIAGNOSIS:  Hypertrophy of nasal turbinates  Chronic maxillary sinusitis  Chronic ethmoidal sinusitis  Chronic sphenoidal sinusitis  POST-OPERATIVE DIAGNOSIS:  Hypertrophy of nasal turbinates Chronic maxillary sinusitis Chronic ethmoidal sinusitis Chronic sphenoidal sinusitis  PROCEDURE:  Procedures: SURGERY, PARANASAL SINUS, ENDOSCOPIC, WITH NASAL SEPTOPLASTY, TURBINOPLASTY, AND MAXILLARY SINUSOTOMY (Bilateral) SINUS SURGERY, ENDOSCOPIC, USING COMPUTER-ASSISTED NAVIGATION (Bilateral)  SURGEON:  Surgeons and Role:    * Zamaria Brazzle, Hadassah BROCKS, MD - Primary  PHYSICIAN ASSISTANT:   ASSISTANTS: none   ANESTHESIA:   general  EBL:  30 mL   BLOOD ADMINISTERED:none  DRAINS: none; doyle splints   LOCAL MEDICATIONS USED:  LIDOCAINE    SPECIMEN:  Source of Specimen:  sinus contents  DISPOSITION OF SPECIMEN:  PATHOLOGY  COUNTS:  correct  TOURNIQUET:  * No tourniquets in log *  DICTATION: .Note written in EPIC  PLAN OF CARE: Discharge to home after PACU  PATIENT DISPOSITION:  PACU - hemodynamically stable.   Delay start of Pharmacological VTE agent (>24hrs) due to surgical blood loss or risk of bleeding: yes

## 2024-09-30 NOTE — Op Note (Signed)
 Otolaryngology Operative note  Kristell Bagshaw Date/Time of Admission: 09/27/2024  6:12 AM  CSN: [id number];MRN:[id number]  DOB: 1986/07/12 Age: 38 y.o. Location: [facility]    Pre-Op Diagnosis: Hypertrophy of nasal turbinates  Chronic maxillary sinusitis  Chronic ethmoidal sinusitis  Chronic sphenoidal sinusitis Nasal septal deviation Nasal obstruction Bilateral concha bullosa  Post-Op Diagnosis: same  Procedure: Stereotactic Computer Assisted Image Guidance (CPT [phone]) 2. Endoscopic Septoplasty - CPT 30520 - 51 3. Bilateral submucous reduction of inferior turbinates with outfracture - CPT 30140-50, 51 4. Bilateral nasal endoscopy with bilateral maxillary antrostomy with tissue removal - CPT 31267-50, 51 5. Bilateral total ethmoidectomy with sphenoidotomy - CPT 31257-50 6. Bilateral excision of concha bullosa - CPT 31240-50, 51   Surgeon: Hadassah Parody, MD  Anesthesia type:  General  Anesthesiologist: Anesthesiologist: Dorethea Cordella SQUIBB, DO CRNA: Franchot Izetta ORN, CRNA; Claudene Delon SQUIBB, CRNA   Staff: Circulator: Jorja Arland HERO, RN Scrub Person: Alto Charmaine MARIE Ezzard, Stephane HERO, CST Vendor Representative : Sameul Cough  Implants: * No implants in log *  Specimens: ID Type Source Tests Collected by Time Destination  1 : Bilateral Sinus Contents Tissue PATH Other SURGICAL PATHOLOGY Yochanan Eddleman, Hadassah BROCKS, MD 09/27/2024 0805     EBL:  30 mL  Drains: none; doyle splints   Post-op disposition and condition: PACU, hemodynamically stable   Findings: - large bilateral concha bullosa; left concha with purulent secretions inside which were evacuated - bilateral maxillary sinuses with mucoid contents which were evacuated - Bilateral IT hypertrophy treated with submucous reduction and outfracture - Bilateral nasal airways significantly improved after septoplasty, ITR and concha bullosa excision.   Complications: None  apparent  Indications and consent:  Matricia Dillenbeck is a 38 y.o. female with diagnoses above. The patient's options were discussed, including risks/benefits/alternatives for each option. Patient expressed understanding, and despite these risks, consented and decided to proceed with above procedures. Informed consent was signed before proceeding.  Procedure: Anesthesia and Prep -- After being properly identified in the preoperative holding area, the patient was brought into the operating suite. She was placed supine on the operating table. A pre-procedural time-out was performed. Pre-operative antibiotics and steroids were administered. After induction of general anesthesia, the patient was successfully intubated with confirmation of tube placement via CO2 return. Eyes were taped closed with steristrips. Pledgets soaked in 1:1000 fluorescein  dyed epinephrine  were placed in each nostril. The face was prepped and draped in usual fashion for sinus surgery.  Image Guidance Registration and Prep -- The registration sticker for the Stereotactic image guidance system was placed on the forehead. The face was registered with good correlation. Landmarks were checked with the probe which showed satisfactory accuracy.   Endoscopic-assisted septoplasty  -- The nose was decongested with epinephrine  1:1000 soaked pledgets. The septum was injected bilaterally with 1% lidocaine  with 1:100,000 epinephrine . A 15 blade was utilized to make a hemi-transfixion incision in the left nasal cavity. A Cottle elevator was then used to elevate a sub-mucoperichondrial flap. Once adequate room was available, the 0 degree endoscopic was placed into the flap and the septal cartilage was visualized. The suction freer was used to raise the flap back to the bony-cartilaginous junction. A caudal transcartilaginous cut was made using the Cottle elevator taking care to leave at least a 1cm caudal strut. The opposing mucoperichondrial flap was then  elevated off the cartilage and bone of the right side. Then, a Rudean forcep was used to cut away the cartilage working anterior to posterior. As the  cartilage was removed superiorly, care was taken to leave at least a 1cm dorsal strut.  A Jansen-Middleton double-action rongeur was used to remove the bony aspect of the deviated septum. Bony maxillary crest was identified and partially removed with a Rudean forcep. The nose was then inspected and the septum was seen to be midline. The previous nasal obstruction was much improved. At the conclusion of the case, the hemitransfixion incision was closed with interrupted 5-0 fast absorbing gut suture and doyle splints coated in mupirocin  were placed bilaterally and sutured to the anterior septum with a 3-0 prolene suture.   Bilateral submucous inferior turbinate reduction with microdebrider and outfracture -- 1% lidocaine  with 1:100,000 units of epinephrine  was injected into the bilateral inferior turbinates. A 15 blade was used to make an incision in the head of the left inferior turbinate. A cottle elevator was then used to elevate a submucosal flap along the medial surface of the turbinate. A microdebrider turbinate blade was inserted into the left turbinate and pieces of bone and soft tissue of the turbinate were resected in a submucosal fashion. Particular attention was paid to the head of the turbinate to improve nasal valve airflow. Once adequate resection was achieved, a Engineering geologist was used to out-fracture the inferior turbinate. This procedure was then repeated on the right side to complete the bilateral submucosal inferior turbinate resection. The nasal airway was then seen to be widely patent.  Left nasal endoscopy with left concha bullosa excision -- The sinus navigator was used to identify the left concha bullosa. The cell was entered using the microdebrider. Purulent secretions were found and suctioned away. The remainder of the cell was opened  using the microdebrider. Blakesley forceps were used to remove additional bone.   Left nasal endoscopy with maxillary antrostomy and removal of tissue-- 1% Lidocaine  with 1:100,000 epinephrine  was used to infiltrate the axilla of the middle turbinate, head of the middle turbinate and the sphenopalatine artery. The middle turbinate was medialized using a freer. The maxillary seeker was inserted and slipped behind the uncinate process. This was pulled anteriorly exposing the edge. A pediatric back-biter was inserted and the uncinate was cut anteriorly toward but not into the nasolacrimal duct. The microdebrider was used to remove the uncinate process above and below the cut made by the back-biter. A curved suction was inserted into the maxillary sinus through the antrostomy/natural os and pushed posteriorly and inferiorly to enlarge the opening. The microdebrider was used to clean up the edges of bone and mucosa. A 30 and 45 degree endoscope was then attached and used to view the sinus. Mucoid contents were suctioned away from the floor of the sinus. The natural os was identified and the maxillary antrostomy was complete.  The sinus was copiously irrigated with normal saline.  Left nasal endoscopy with total ethmoidectomy -- The 0 degree endoscope was reattached and the ethmoid bulla was identified visually as well as confirmed via the image guidance system. The microdebrider was used to open the bulla working from medial and inferior to lateral and superior. Removal extended laterally near the lamina papyracea and superiorly toward skull base. Ethmoid cells were opened in succession using image guidance intermittently to verify location.  The basal lamella of the middle turbinate was identified and removed using the microdebrider overlying the expected position of the superior turbinate.  This was removed to visualize the posterior ethmoid cavity.  The posterior ethmoid cells were removed in like fashion using  the image guidance system to  meticulously locate all ethmoid partitions. The microdebrider was used to remove thin bone and clean up mucosal edges. The ethmoid cavity was irrigated copiously with normal saline.  Left nasal endoscopy with sphenoidotomy  -- The superior turbinate had already been identified after removing the vertical lamella of the middle turbinate.  Proceeding between the superior turbinate and the septum, the sphenoid ostium was identified.  The ostium was cannulated with the image guidance probe and dilated such that a mushroom punch was inserted and used to widen the ostium. The sinus was irrigated copiously with normal saline.  Right nasal endoscopy with right concha bullosa excision -- The sinus navigator was used to identify the right concha bullosa. The cell was entered using the microdebrider and the remainder of the cell was opened using the microdebrider. Blakesley forceps were used to remove additional bone.   Right nasal endoscopy with maxillary antrostomy and removal of tissue -- 1% Lidocaine  with 1:100,000 epinephrine  was used to infiltrate the axilla of the middle turbinate, head of the middle turbinate and the sphenopalatine artery. The middle turbinate was medialized using a freer. The maxillary seeker was inserted and slipped behind the uncinate process. This was pulled anteriorly exposing the edge. A pediatric back-biter was inserted and the uncinate was cut anteriorly toward but not into the nasolacrimal duct. The microdebrider was used to remove the uncinate process above and below the cut made by the back-biter. A curved suction was inserted into the maxillary sinus through the antrostomy/natural os and pushed posteriorly and inferiorly to enlarge the opening. The microdebrider was used to clean up the edges of bone and mucosa. A 30 and 45 degree endoscope was then attached and used to view the sinus. The natural os was identified and the maxillary antrostomy was  complete. Mucoid contents were seen inside the sinus. These were evacuated using suction.  The sinus was copiously irrigated with normal saline.  Right nasal endoscopy with total ethmoidectomy -- The 0 degree endoscope was reattached and the ethmoid bulla was identified visually as well as confirmed via the image guidance system. The microdebrider was used to open the bulla working from medial and inferior to lateral and superior. Removal extended laterally near the lamina papyracea and superiorly toward skull base. Ethmoid cells were opened in succession using image guidance intermittently to verify location.  The basal lamella of the middle turbinate was identified and removed using the microdebrider overlying the expected position of the superior turbinate.  This was removed to visualize the posterior ethmoid cavity.  The posterior ethmoid cells were removed in like fashion using the image guidance system to meticulously locate all ethmoid partitions. The microdebrider was used to remove thin bone and clean up mucosal edges. The ethmoid cavity was irrigated copiously with normal saline.  Right nasal endoscopy with sphenoidotomy  -- The superior turbinate had already been identified after removing the vertical lamella of the middle turbinate.  Proceeding between the superior turbinate and the septum, the sphenoid ostium was identified.  The ostium was cannulated with the image guidance probe and dilated such that a mushroom punch was inserted and used to widen the ostium. The sinus was irrigated copiously with normal saline.  Conclusion -- Both sides were inspected and no orbital fat or evidence of cerebral spinal fluid leak were observed. Both sides were irrigated and clot removed. Two Small PosiSepX packs (0.5 x 1.5cm) were inserted lateral to the middle turbinates bilaterally.  A flexible suction catheter was used to remove fluid and blood  from the oropharynx and hypopharynx.   Steri strips were removed  from the eyelids and the eyes were checked for tension or proptosis, none was observed. The image guidance sticker was removed. The patient's skin was cleaned. She was returned to the care of the anesthesia team. She was then weaned from the anesthetic and transported to the PACU in stable condition.

## 2024-10-04 ENCOUNTER — Encounter (INDEPENDENT_AMBULATORY_CARE_PROVIDER_SITE_OTHER): Payer: Self-pay

## 2024-10-04 ENCOUNTER — Ambulatory Visit (INDEPENDENT_AMBULATORY_CARE_PROVIDER_SITE_OTHER)

## 2024-10-04 VITALS — BP 130/77 | HR 96

## 2024-10-04 DIAGNOSIS — J343 Hypertrophy of nasal turbinates: Secondary | ICD-10-CM

## 2024-10-04 DIAGNOSIS — J342 Deviated nasal septum: Secondary | ICD-10-CM

## 2024-10-04 DIAGNOSIS — J328 Other chronic sinusitis: Secondary | ICD-10-CM | POA: Diagnosis not present

## 2024-10-04 DIAGNOSIS — J3489 Other specified disorders of nose and nasal sinuses: Secondary | ICD-10-CM

## 2024-10-04 DIAGNOSIS — Z9889 Other specified postprocedural states: Secondary | ICD-10-CM

## 2024-10-04 NOTE — Progress Notes (Signed)
 S/p bilateral FESS and septo/turbs on 09/27/24 S: Patient reports no significant bleeding. Does report expected nasal pain. No fever. Breathing a little better. Otherwise no significant issues. O: Doyle splints in place, removed; after removal, no septal hematoma noted; Able to visualize axilla of MT bilaterally; tubinates reduced; no epistaxis. No evidence of infection. Nasal endoscopy was indicated to better evaluate the nose and paranasal sinuses, given the patient's history of recent sinus surgery and exam findings, and is detailed below. Prior to proceeding, verbal consent was obtained.   PROCEDURE: Bilateral Diagnostic Rigid Nasal Endoscopy with Bilateral Debridement Pre-procedure diagnosis: Post-operative examination and care after Functional Endoscopic Sinus Surgery - of note: this procedure was NOT performed for management of the septoplasty or the turbinate reduction. Post-procedure diagnosis: same Indication: See pre-procedure diagnosis and physical exam above Complications: None apparent EBL: 0 mL Anesthesia: Lidocaine  4% and topical decongestant was topically sprayed in each nasal cavity   Description of Procedure:  Patient was identified. A rigid 30 degree endoscope was utilized to evaluate the sinonasal cavities, mucosa, sinus ostia and turbinates and septum.  Overall, signs of mucosal inflammation are noted. Also noted are post-surgical changes with some crusting and clot within bilateral middle meati and sphenoethmoid recess with residual posi sep packing. These were debrided with a 8 Fr suction. After debridement, sinus cavity patency was improved. No adverse synechiae are noted today Right Middle meatus:  patent - required medialization of middle turbinate to avoid adverse scarring Right SE Recess:  patent Left MM:  patent -required medialization of middle turbinate to avoid for scarring Left SE Recess:  patent   CPT CODE -- 31237 - Mod 79, 50   A/P: 38 y.o. female with  chronic sinusitis, nasal obstruction, bilateral concha bullosa, nasal septal deviation inferior turbinate hypertrophy s/p bilateral FESS and septoplasty.and ITR After splint removal, patient is doing better with improved breathing Sinus cavities debrided. Start nasal saline rinses BID  F/u 2 weeks

## 2024-10-04 NOTE — Patient Instructions (Signed)
 I have ordered an imaging study for you to complete prior to your next visit. Please call [facility] Scheduling at [phone] to schedule your imaging if you have not received a call within 24 hours. If you are unable to complete your imaging study prior to your next scheduled visit please call our office to let us  know.   Use two sprays of flonase in each nostril twice per day  right after, use astelin spray two sprays each nostril twice per day   Andres Bangs Med Nasal Saline Rinse - use daily; stop 3 days before CT scan - start nasal saline rinses with NeilMed Bottle available over the counter    Nasal Saline Irrigation instructions: If you choose to make your own salt water solution, You will need: Salt (kosher, canning, or pickling salt) Baking soda Nasal irrigation bottle (i.e. Andres Bangs Med Sinus Rinse) Measuring spoon ( teaspoon) Distilled / boiled water   Mix solution Mix 1 teaspoon of salt, 1/2 teaspoon of baking soda and 1 cup of water into irrigation bottle ** May use saline packet instead of homemade recipe for this step if you prefer If medicine was prescribed to be mixed with solution, place this into bottle Examples 2 inches of 2% mupirocin ointment Budesonide solution Position your head: Lean over sink (about 45 degrees) Rotate head (about 45 degrees) so that one nostril is above the other Irrigate Insert tip of irrigation bottle into upper nostril so it forms a comfortable seal Irrigate while breathing through your mouth May remove the straw from the bottle in order to irrigate the entire solution (important if medicine was added) Exhale through nose when finished and blow nose as necessary  Repeat on opposite side with other 1/2 of solution (120 mL) or remake solution if all 240 mL was used on first side Wash irrigation bottle regularly, replace every 3 months

## 2024-10-21 ENCOUNTER — Ambulatory Visit (INDEPENDENT_AMBULATORY_CARE_PROVIDER_SITE_OTHER)

## 2024-10-21 ENCOUNTER — Encounter (INDEPENDENT_AMBULATORY_CARE_PROVIDER_SITE_OTHER): Payer: Self-pay

## 2024-10-21 VITALS — BP 123/65 | HR 80

## 2024-10-21 DIAGNOSIS — Z9889 Other specified postprocedural states: Secondary | ICD-10-CM

## 2024-10-21 DIAGNOSIS — J328 Other chronic sinusitis: Secondary | ICD-10-CM

## 2024-10-21 DIAGNOSIS — J342 Deviated nasal septum: Secondary | ICD-10-CM

## 2024-10-21 DIAGNOSIS — J3489 Other specified disorders of nose and nasal sinuses: Secondary | ICD-10-CM

## 2024-10-21 MED ORDER — PREDNISONE 10 MG PO TABS: ORAL_TABLET | ORAL | 0 refills | Status: AC

## 2024-10-21 NOTE — Progress Notes (Signed)
 S/p bilateral FESS and septo/turbs on 09/27/24 S: Patient reports she is overall doing well, improved breathing through her nose.  She is having some intermittent pain on the right side if she pushes on the soft tissue of her nose on the outside.  She also noted some minor bleeding from the right side of her nose with saline rinses.  O: Able to visualize axilla of MT bilaterally; tubinates reduced; no epistaxis. No evidence of infection. Nasal endoscopy was indicated to better evaluate the nose and paranasal sinuses, given the patient's history of recent sinus surgery and exam findings, and is detailed below. Prior to proceeding, verbal consent was obtained.   PROCEDURE: Bilateral Diagnostic Rigid Nasal Endoscopy with Bilateral Debridement Pre-procedure diagnosis: Post-operative examination and care after Functional Endoscopic Sinus Surgery - of note: this procedure was NOT performed for management of the septoplasty or the turbinate reduction. Post-procedure diagnosis: same Indication: See pre-procedure diagnosis and physical exam above Complications: None apparent EBL: 0 mL Anesthesia: Lidocaine  4% and topical decongestant was topically sprayed in each nasal cavity   Description of Procedure:  Patient was identified. A rigid 30 degree endoscope was utilized to evaluate the sinonasal cavities, mucosa, sinus ostia and turbinates and septum.  Overall, signs of mucosal inflammation are noted. Also noted are post-surgical changes with some crusting and clot within bilateral middle meati and sphenoethmoid recess.  These were debrided with a 8 Fr suction. After debridement, sinus cavity patency was improved. No adverse synechiae are noted today.  She does have some residual swelling from surgery Right Middle meatus:  patent -crusting removed from middle meatus Right SE Recess:  patent Left MM:  patent -required medialization of middle turbinate to avoid for scarring Left SE Recess:  patent   CPT CODE  -- 31237 - Mod 79, 50   A/P: 39 y.o. female with chronic sinusitis, nasal obstruction, bilateral concha bullosa, nasal septal deviation inferior turbinate hypertrophy s/p bilateral FESS and septoplasty.and ITR  She is doing well with improved breathing but she is having some intermittent pain on the right side and also had a small amount of blood from right side of her nose when she used saline rinse.  On scope exam, I do not see any areas of bleeding mucosa.  Advised this is likely due to saline rinse assisting with removal of scab and she had a small amount of mucosal bleeding.  I would not expect this can to continue.   She does have some residual swelling and offered small course of prednisone  which she is interested in doing to see if this could help with the pain on the right side.  Sinus cavities debrided. Continue nasal saline rinses BID  F/u 2 weeks

## 2024-10-25 ENCOUNTER — Encounter (HOSPITAL_COMMUNITY): Payer: Self-pay | Admitting: Emergency Medicine

## 2024-10-25 ENCOUNTER — Emergency Department (HOSPITAL_COMMUNITY)
Admission: EM | Admit: 2024-10-25 | Discharge: 2024-10-25 | Disposition: A | Discharge status: Home / Self Care | Attending: Emergency Medicine | Admitting: Emergency Medicine

## 2024-10-25 ENCOUNTER — Emergency Department (HOSPITAL_COMMUNITY)

## 2024-10-25 ENCOUNTER — Other Ambulatory Visit: Payer: Self-pay

## 2024-10-25 DIAGNOSIS — M79672 Pain in left foot: Secondary | ICD-10-CM | POA: Diagnosis present

## 2024-10-25 MED ORDER — ACETAMINOPHEN 325 MG PO TABS
650.0000 mg | ORAL_TABLET | Freq: Once | ORAL | Status: AC
  Administered 2024-10-25: 650 mg via ORAL
  Filled 2024-10-25: qty 2

## 2024-10-25 MED ORDER — NAPROXEN 500 MG PO TABS: 500.0000 mg | ORAL_TABLET | Freq: Two times a day (BID) | ORAL | 0 refills | Status: AC

## 2024-10-25 MED ORDER — NAPROXEN 500 MG PO TABS
500.0000 mg | ORAL_TABLET | Freq: Once | ORAL | Status: AC
  Administered 2024-10-25: 500 mg via ORAL
  Filled 2024-10-25: qty 1

## 2024-10-25 NOTE — ED Triage Notes (Signed)
 Pt with left foot pain and swelling since Wed night.  Unable to bear weight.  No known injury.

## 2024-10-25 NOTE — Discharge Instructions (Addendum)
 The cause for your pain is not clear.  However, there is no evidence of fracture.  Please apply ice for 30 minutes at a time, 4 times a day.  I have given a prescription for an anti-inflammatory medication-naproxen .  Take that twice a day.  You may take acetaminophen  as needed for pain not relieved by the naproxen .  The combination of naproxen  and acetaminophen  gives you better pain relief in either medication by itself.  If symptoms are not improving, please follow-up with the orthopedic physician for further evaluation.

## 2024-10-25 NOTE — ED Provider Notes (Signed)
 " [place] EMERGENCY DEPARTMENT AT [facility] Provider Note   CSN: [id number] Arrival date & time: 10/25/24  0122     Patient presents with: Foot Pain   Lori Black is a 39 y.o. female.   The history is provided by the patient.  Foot Pain   She complains of pain in her left foot which started 2 days ago.  She denies any trauma.  She is unable to put weight on it.  She has not taken anything for pain.    Prior to Admission medications  Medication Sig Start Date End Date Taking? Authorizing Provider  albuterol  (PROAIR  HFA) 108 (90 Base) MCG/ACT inhaler 2 puffs every 4 hours as needed only  if your can't catch your breath 03/10/23   Darlean Ozell NOVAK, MD  albuterol  (PROVENTIL ) (2.5 MG/3ML) 0.083% nebulizer solution Take 3 mLs (2.5 mg total) by nebulization every 4 (four) hours as needed for wheezing or shortness of breath. 02/10/23   Darlean Ozell NOVAK, MD  AMOXICILLIN  PO Take by mouth. Just finished 09/26/2024 Patient not taking: Reported on 10/21/2024    [provider]  budesonide -glycopyrrolate-formoterol  (BREZTRI  AEROSPHERE) 160-9-4.8 MCG/ACT AERO inhaler Inhale 2 puffs into the lungs in the morning and at bedtime. 05/13/24   Darlean Ozell NOVAK, MD  budesonide -glycopyrrolate-formoterol  (BREZTRI  AEROSPHERE) 160-9-4.8 MCG/ACT AERO inhaler Inhale 2 puffs into the lungs in the morning and at bedtime. 06/21/24   Darlean Ozell NOVAK, MD  diphenhydrAMINE  (BENADRYL ) 50 MG tablet Take 50 mg by mouth. 06/11/24   [provider]  HYDROcodone -acetaminophen  (NORCO/VICODIN) 5-325 MG tablet Take 1 tablet by mouth every 6 (six) hours as needed for up to 12 doses for moderate pain (pain score 4-6) or severe pain (pain score 7-10). Patient not taking: Reported on 10/21/2024 09/27/24   Masciello, Hadassah BROCKS, MD  ibuprofen  (ADVIL ) 800 MG tablet 800 mg. Patient not taking: Reported on 10/21/2024 06/11/24   [provider]  predniSONE  (DELTASONE ) 10 MG tablet Take 4 tablets (40 mg total) by  mouth daily with breakfast for 1 day, THEN 3 tablets (30 mg total) daily with breakfast for 2 days, THEN 2 tablets (20 mg total) daily with breakfast for 2 days, THEN 1 tablet (10 mg total) daily with breakfast for 2 days. 10/21/24 10/28/24  Masciello, Hadassah BROCKS, MD  promethazine  (PHENERGAN ) 25 MG tablet 1 TAB PO WITH FULL GLASS OF WATER WITH FULL GLASS OF WATER 06/11/24   [provider]  pseudoephedrine  (SUDAFED) 30 MG tablet Take 1 tablet (30 mg total) by mouth every 4 (four) hours as needed for congestion. Patient not taking: Reported on 10/21/2024 05/12/24   Griselda Norris, MD    Allergies: Patient has no known allergies.    Review of Systems  All other systems reviewed and are negative.   Updated Vital Signs BP (!) 142/73 (BP Location: Left Arm)   Pulse 86   Temp 99.5 F (37.5 C) (Oral)   Resp 16   SpO2 98%   Physical Exam Vitals and nursing note reviewed.   39 year old female, resting comfortably and in no acute distress. Vital signs are significant for borderline elevated blood pressure. Oxygen saturation is 98%, which is normal. Head is normocephalic and atraumatic.  Lungs are clear without rales, wheezes, or rhonchi. Heart has regular rate and rhythm without murmur. Extremities: There is no swelling or erythema or warmth to the left foot, but there is tenderness to palpation rather diffusely.  There is pain with passive range of motion in  any direction.  Capillary refill is prompt and sensation is normal. Skin is warm and dry without rash. Neurologic: Awake and alert.   Radiology: DG Foot Complete Left Result Date: 10/25/2024 EXAM: 3 OR MORE VIEW(S) XRAY OF THE LEFT FOOT 10/25/2024 01:49:00 AM COMPARISON: None available. CLINICAL HISTORY: Swelling Swelling FINDINGS: BONES AND JOINTS: No acute fracture. No malalignment. SOFT TISSUES: Unremarkable. IMPRESSION: 1. No significant abnormality. Electronically signed by: Franky Crease MD MD 10/25/2024 02:01 AM EST RP Workstation:  [id number]     Procedures   Medications Ordered in the ED  naproxen  (NAPROSYN ) tablet 500 mg (500 mg Oral Given 10/25/24 0314)  acetaminophen  (TYLENOL ) tablet 650 mg (650 mg Oral Given 10/25/24 0313)                                    Medical Decision Making Amount and/or Complexity of Data Reviewed Radiology: ordered.   Left foot pain of uncertain etiology.  No history of trauma so fracture is unlikely, no erythema or warmth so gout is unlikely.  I have reviewed her past records and note that she is on a prednisone  taper making gout even less likely.  Pattern is not typical of plantar fasciitis.  X-ray shows no evidence of fracture or dislocation.  Have independently viewed the images, and agree with the radiologist's interpretation.  I have ordered a dose of naproxen  and acetaminophen  and I have ordered crutches.  I have advised her to apply ice and I have given prescription for naproxen .  Advised her to add acetaminophen  as needed.  Follow-up with orthopedics if not improving.     Final diagnoses:  Foot pain, left    ED Discharge Orders          Ordered    naproxen  (NAPROSYN ) 500 MG tablet  2 times daily        10/25/24 0316               Raford Lenis, MD 10/25/24 [id number]  "

## 2024-11-06 ENCOUNTER — Ambulatory Visit (INDEPENDENT_AMBULATORY_CARE_PROVIDER_SITE_OTHER)

## 2024-11-27 ENCOUNTER — Ambulatory Visit (INDEPENDENT_AMBULATORY_CARE_PROVIDER_SITE_OTHER)
